# Patient Record
Sex: Female | Born: 1965
Health system: Southern US, Community
[De-identification: ages and names within clinical notes are randomized; demographics above are authoritative.]

## PROBLEM LIST (undated history)

## (undated) DIAGNOSIS — Z8669 Personal history of other diseases of the nervous system and sense organs: Secondary | ICD-10-CM

## (undated) DIAGNOSIS — E119 Type 2 diabetes mellitus without complications: Secondary | ICD-10-CM

## (undated) DIAGNOSIS — K760 Fatty (change of) liver, not elsewhere classified: Secondary | ICD-10-CM

## (undated) DIAGNOSIS — S060XAA Concussion with loss of consciousness status unknown, initial encounter: Secondary | ICD-10-CM

## (undated) DIAGNOSIS — G4730 Sleep apnea, unspecified: Secondary | ICD-10-CM

## (undated) DIAGNOSIS — S060X9A Concussion with loss of consciousness of unspecified duration, initial encounter: Secondary | ICD-10-CM

## (undated) DIAGNOSIS — R609 Edema, unspecified: Secondary | ICD-10-CM

## (undated) DIAGNOSIS — F32A Depression, unspecified: Secondary | ICD-10-CM

## (undated) DIAGNOSIS — M1990 Unspecified osteoarthritis, unspecified site: Secondary | ICD-10-CM

## (undated) DIAGNOSIS — R21 Rash and other nonspecific skin eruption: Secondary | ICD-10-CM

## (undated) DIAGNOSIS — D179 Benign lipomatous neoplasm, unspecified: Secondary | ICD-10-CM

## (undated) DIAGNOSIS — Z6841 Body Mass Index (BMI) 40.0 and over, adult: Secondary | ICD-10-CM

## (undated) DIAGNOSIS — F419 Anxiety disorder, unspecified: Secondary | ICD-10-CM

## (undated) DIAGNOSIS — I77810 Thoracic aortic ectasia: Secondary | ICD-10-CM

## (undated) DIAGNOSIS — E7800 Pure hypercholesterolemia, unspecified: Secondary | ICD-10-CM

## (undated) DIAGNOSIS — J45909 Unspecified asthma, uncomplicated: Secondary | ICD-10-CM

## (undated) DIAGNOSIS — G4733 Obstructive sleep apnea (adult) (pediatric): Secondary | ICD-10-CM

## (undated) DIAGNOSIS — I10 Essential (primary) hypertension: Secondary | ICD-10-CM

## (undated) HISTORY — DX: Body Mass Index (BMI) 40.0 and over, adult: Z684

## (undated) HISTORY — DX: Anxiety disorder, unspecified: F41.9

## (undated) HISTORY — DX: Sleep apnea, unspecified: G47.30

## (undated) HISTORY — DX: Pure hypercholesterolemia, unspecified: E78.00

## (undated) HISTORY — PX: UPPER GASTROINTESTINAL ENDOSCOPY: SHX188

## (undated) HISTORY — DX: Unspecified osteoarthritis, unspecified site: M19.90

## (undated) HISTORY — DX: Fatty (change of) liver, not elsewhere classified: K76.0

## (undated) HISTORY — DX: Edema, unspecified: R60.9

## (undated) HISTORY — PX: SPINAL CORD STIMULATOR IMPLANT: SHX2422

## (undated) HISTORY — DX: Depression, unspecified: F32.A

## (undated) HISTORY — DX: Obstructive sleep apnea (adult) (pediatric): G47.33

## (undated) HISTORY — PX: BACK SURGERY: SHX140

## (undated) HISTORY — DX: Personal history of other diseases of the nervous system and sense organs: Z86.69

## (undated) HISTORY — DX: Thoracic aortic ectasia: I77.810

## (undated) HISTORY — PX: ABDOMINAL HYSTERECTOMY: SHX81

## (undated) HISTORY — PX: CARPAL TUNNEL RELEASE: SHX101

## (undated) HISTORY — PX: SHOULDER SURGERY: SHX246

## (undated) HISTORY — DX: Morbid (severe) obesity due to excess calories: E66.01

---

## 1985-06-15 HISTORY — PX: KNEE SURGERY: SHX244

## 1998-02-09 ENCOUNTER — Emergency Department (HOSPITAL_COMMUNITY): Admission: EM | Admit: 1998-02-09 | Discharge: 1998-02-10 | Payer: Self-pay | Admitting: Emergency Medicine

## 1998-09-29 ENCOUNTER — Emergency Department (HOSPITAL_COMMUNITY): Admission: EM | Admit: 1998-09-29 | Discharge: 1998-09-29 | Payer: Self-pay | Admitting: Emergency Medicine

## 1998-11-05 ENCOUNTER — Encounter: Payer: Self-pay | Admitting: Internal Medicine

## 1998-11-05 ENCOUNTER — Ambulatory Visit (HOSPITAL_COMMUNITY): Admission: RE | Admit: 1998-11-05 | Discharge: 1998-11-05 | Payer: Self-pay | Admitting: Internal Medicine

## 1999-07-25 ENCOUNTER — Emergency Department (HOSPITAL_COMMUNITY): Admission: EM | Admit: 1999-07-25 | Discharge: 1999-07-25 | Payer: Self-pay | Admitting: Emergency Medicine

## 1999-11-05 ENCOUNTER — Emergency Department (HOSPITAL_COMMUNITY): Admission: EM | Admit: 1999-11-05 | Discharge: 1999-11-05 | Payer: Self-pay | Admitting: Emergency Medicine

## 2000-07-28 ENCOUNTER — Encounter: Payer: Self-pay | Admitting: Family Medicine

## 2000-07-28 ENCOUNTER — Ambulatory Visit (HOSPITAL_COMMUNITY): Admission: RE | Admit: 2000-07-28 | Discharge: 2000-07-28 | Payer: Self-pay | Admitting: Family Medicine

## 2000-11-23 ENCOUNTER — Encounter: Payer: Self-pay | Admitting: Family Medicine

## 2000-11-23 ENCOUNTER — Encounter: Admission: RE | Admit: 2000-11-23 | Discharge: 2000-11-23 | Payer: Self-pay | Admitting: Family Medicine

## 2003-01-11 ENCOUNTER — Other Ambulatory Visit: Admission: RE | Admit: 2003-01-11 | Discharge: 2003-01-11 | Payer: Self-pay | Admitting: Obstetrics and Gynecology

## 2003-01-11 ENCOUNTER — Other Ambulatory Visit: Admission: RE | Admit: 2003-01-11 | Discharge: 2003-01-11 | Payer: Self-pay | Admitting: Obstetrics & Gynecology

## 2003-01-31 ENCOUNTER — Ambulatory Visit (HOSPITAL_COMMUNITY): Admission: RE | Admit: 2003-01-31 | Discharge: 2003-01-31 | Payer: Self-pay | Admitting: Obstetrics & Gynecology

## 2003-02-01 ENCOUNTER — Encounter (INDEPENDENT_AMBULATORY_CARE_PROVIDER_SITE_OTHER): Payer: Self-pay | Admitting: Specialist

## 2003-02-01 ENCOUNTER — Ambulatory Visit (HOSPITAL_COMMUNITY): Admission: RE | Admit: 2003-02-01 | Discharge: 2003-02-01 | Payer: Self-pay | Admitting: Obstetrics & Gynecology

## 2003-04-30 ENCOUNTER — Emergency Department (HOSPITAL_COMMUNITY): Admission: EM | Admit: 2003-04-30 | Discharge: 2003-04-30 | Payer: Self-pay | Admitting: Emergency Medicine

## 2003-06-19 ENCOUNTER — Emergency Department (HOSPITAL_COMMUNITY): Admission: EM | Admit: 2003-06-19 | Discharge: 2003-06-19 | Payer: Self-pay | Admitting: Emergency Medicine

## 2003-09-18 ENCOUNTER — Emergency Department (HOSPITAL_COMMUNITY): Admission: EM | Admit: 2003-09-18 | Discharge: 2003-09-18 | Payer: Self-pay | Admitting: Emergency Medicine

## 2003-10-10 ENCOUNTER — Emergency Department (HOSPITAL_COMMUNITY): Admission: EM | Admit: 2003-10-10 | Discharge: 2003-10-10 | Payer: Self-pay | Admitting: Emergency Medicine

## 2003-11-21 ENCOUNTER — Other Ambulatory Visit: Admission: RE | Admit: 2003-11-21 | Discharge: 2003-11-21 | Payer: Self-pay | Admitting: Obstetrics & Gynecology

## 2003-12-27 ENCOUNTER — Inpatient Hospital Stay (HOSPITAL_COMMUNITY): Admission: AD | Admit: 2003-12-27 | Discharge: 2003-12-27 | Payer: Self-pay | Admitting: Obstetrics and Gynecology

## 2004-01-17 ENCOUNTER — Inpatient Hospital Stay (HOSPITAL_COMMUNITY): Admission: AD | Admit: 2004-01-17 | Discharge: 2004-01-17 | Payer: Self-pay | Admitting: Obstetrics & Gynecology

## 2004-02-25 ENCOUNTER — Inpatient Hospital Stay (HOSPITAL_COMMUNITY): Admission: AD | Admit: 2004-02-25 | Discharge: 2004-02-25 | Payer: Self-pay | Admitting: Obstetrics and Gynecology

## 2004-03-05 ENCOUNTER — Ambulatory Visit (HOSPITAL_COMMUNITY): Admission: RE | Admit: 2004-03-05 | Discharge: 2004-03-05 | Payer: Self-pay | Admitting: Obstetrics & Gynecology

## 2004-04-08 ENCOUNTER — Encounter: Admission: RE | Admit: 2004-04-08 | Discharge: 2004-04-16 | Payer: Self-pay | Admitting: Obstetrics & Gynecology

## 2004-05-11 ENCOUNTER — Inpatient Hospital Stay (HOSPITAL_COMMUNITY): Admission: AD | Admit: 2004-05-11 | Discharge: 2004-05-11 | Payer: Self-pay | Admitting: Obstetrics and Gynecology

## 2004-05-23 ENCOUNTER — Encounter (INDEPENDENT_AMBULATORY_CARE_PROVIDER_SITE_OTHER): Payer: Self-pay | Admitting: Specialist

## 2004-05-23 ENCOUNTER — Inpatient Hospital Stay (HOSPITAL_COMMUNITY): Admission: AD | Admit: 2004-05-23 | Discharge: 2004-05-26 | Payer: Self-pay | Admitting: Obstetrics and Gynecology

## 2004-05-27 ENCOUNTER — Inpatient Hospital Stay (HOSPITAL_COMMUNITY): Admission: RE | Admit: 2004-05-27 | Discharge: 2004-05-27 | Payer: Self-pay | Admitting: Obstetrics and Gynecology

## 2004-05-29 ENCOUNTER — Ambulatory Visit (HOSPITAL_COMMUNITY): Admission: RE | Admit: 2004-05-29 | Discharge: 2004-05-29 | Payer: Self-pay | Admitting: Specialist

## 2004-06-04 ENCOUNTER — Inpatient Hospital Stay (HOSPITAL_COMMUNITY): Admission: AD | Admit: 2004-06-04 | Discharge: 2004-06-04 | Payer: Self-pay | Admitting: Obstetrics and Gynecology

## 2004-06-15 HISTORY — PX: BACK SURGERY: SHX140

## 2004-06-15 HISTORY — PX: ABDOMINAL HYSTERECTOMY: SHX81

## 2004-06-20 ENCOUNTER — Other Ambulatory Visit: Admission: RE | Admit: 2004-06-20 | Discharge: 2004-06-20 | Payer: Self-pay | Admitting: Obstetrics and Gynecology

## 2004-06-27 ENCOUNTER — Inpatient Hospital Stay (HOSPITAL_COMMUNITY): Admission: AD | Admit: 2004-06-27 | Discharge: 2004-06-27 | Payer: Self-pay | Admitting: Obstetrics & Gynecology

## 2004-10-24 ENCOUNTER — Inpatient Hospital Stay (HOSPITAL_COMMUNITY): Admission: RE | Admit: 2004-10-24 | Discharge: 2004-10-25 | Payer: Self-pay | Admitting: Specialist

## 2004-12-09 ENCOUNTER — Emergency Department (HOSPITAL_COMMUNITY): Admission: EM | Admit: 2004-12-09 | Discharge: 2004-12-09 | Payer: Self-pay | Admitting: Emergency Medicine

## 2005-03-13 ENCOUNTER — Encounter: Admission: RE | Admit: 2005-03-13 | Discharge: 2005-03-13 | Payer: Self-pay | Admitting: Specialist

## 2005-04-23 ENCOUNTER — Inpatient Hospital Stay (HOSPITAL_COMMUNITY): Admission: RE | Admit: 2005-04-23 | Discharge: 2005-04-27 | Payer: Self-pay | Admitting: Specialist

## 2005-06-15 HISTORY — PX: SHOULDER SURGERY: SHX246

## 2005-11-25 ENCOUNTER — Ambulatory Visit (HOSPITAL_COMMUNITY): Admission: RE | Admit: 2005-11-25 | Discharge: 2005-11-25 | Payer: Self-pay | Admitting: Specialist

## 2006-01-18 ENCOUNTER — Emergency Department (HOSPITAL_COMMUNITY): Admission: EM | Admit: 2006-01-18 | Discharge: 2006-01-18 | Payer: Self-pay | Admitting: Emergency Medicine

## 2006-06-05 ENCOUNTER — Encounter: Admission: RE | Admit: 2006-06-05 | Discharge: 2006-06-05 | Payer: Self-pay

## 2006-09-03 ENCOUNTER — Ambulatory Visit (HOSPITAL_COMMUNITY): Admission: RE | Admit: 2006-09-03 | Discharge: 2006-09-03 | Payer: Self-pay | Admitting: Obstetrics and Gynecology

## 2006-09-22 ENCOUNTER — Other Ambulatory Visit: Admission: RE | Admit: 2006-09-22 | Discharge: 2006-09-22 | Payer: Self-pay | Admitting: Obstetrics and Gynecology

## 2006-10-01 ENCOUNTER — Ambulatory Visit (HOSPITAL_COMMUNITY): Admission: RE | Admit: 2006-10-01 | Discharge: 2006-10-01 | Payer: Self-pay | Admitting: Specialist

## 2006-11-10 ENCOUNTER — Encounter: Admission: RE | Admit: 2006-11-10 | Discharge: 2006-11-10 | Payer: Self-pay | Admitting: Obstetrics and Gynecology

## 2007-08-18 ENCOUNTER — Encounter: Admission: RE | Admit: 2007-08-18 | Discharge: 2007-08-18 | Payer: Self-pay | Admitting: Specialist

## 2007-09-15 ENCOUNTER — Ambulatory Visit: Payer: Self-pay | Admitting: *Deleted

## 2007-10-05 ENCOUNTER — Inpatient Hospital Stay (HOSPITAL_COMMUNITY): Admission: RE | Admit: 2007-10-05 | Discharge: 2007-10-09 | Payer: Self-pay | Admitting: Specialist

## 2007-10-07 ENCOUNTER — Ambulatory Visit: Payer: Self-pay | Admitting: Vascular Surgery

## 2007-10-07 ENCOUNTER — Encounter (INDEPENDENT_AMBULATORY_CARE_PROVIDER_SITE_OTHER): Payer: Self-pay | Admitting: Specialist

## 2007-11-16 ENCOUNTER — Encounter: Admission: RE | Admit: 2007-11-16 | Discharge: 2007-11-16 | Payer: Self-pay | Admitting: Family Medicine

## 2007-12-23 ENCOUNTER — Ambulatory Visit: Payer: Self-pay | Admitting: *Deleted

## 2008-02-10 ENCOUNTER — Encounter: Admission: RE | Admit: 2008-02-10 | Discharge: 2008-02-10 | Payer: Self-pay | Admitting: Specialist

## 2008-03-22 ENCOUNTER — Other Ambulatory Visit: Admission: RE | Admit: 2008-03-22 | Discharge: 2008-03-22 | Payer: Self-pay | Admitting: Obstetrics and Gynecology

## 2008-06-12 ENCOUNTER — Inpatient Hospital Stay (HOSPITAL_COMMUNITY): Admission: RE | Admit: 2008-06-12 | Discharge: 2008-06-14 | Payer: Self-pay | Admitting: Obstetrics and Gynecology

## 2008-06-12 ENCOUNTER — Encounter (INDEPENDENT_AMBULATORY_CARE_PROVIDER_SITE_OTHER): Payer: Self-pay | Admitting: Obstetrics and Gynecology

## 2008-10-13 ENCOUNTER — Ambulatory Visit (HOSPITAL_COMMUNITY): Admission: RE | Admit: 2008-10-13 | Discharge: 2008-10-13 | Payer: Self-pay | Admitting: Family Medicine

## 2008-10-13 ENCOUNTER — Ambulatory Visit: Payer: Self-pay | Admitting: Vascular Surgery

## 2008-10-13 ENCOUNTER — Encounter (INDEPENDENT_AMBULATORY_CARE_PROVIDER_SITE_OTHER): Payer: Self-pay | Admitting: Family Medicine

## 2008-10-17 ENCOUNTER — Emergency Department (HOSPITAL_COMMUNITY): Admission: EM | Admit: 2008-10-17 | Discharge: 2008-10-17 | Payer: Self-pay | Admitting: Emergency Medicine

## 2008-11-09 ENCOUNTER — Encounter: Admission: RE | Admit: 2008-11-09 | Discharge: 2008-11-09 | Payer: Self-pay | Admitting: Specialist

## 2008-12-10 ENCOUNTER — Encounter: Admission: RE | Admit: 2008-12-10 | Discharge: 2008-12-10 | Payer: Self-pay | Admitting: Family Medicine

## 2008-12-13 ENCOUNTER — Encounter: Admission: RE | Admit: 2008-12-13 | Discharge: 2008-12-13 | Payer: Self-pay | Admitting: Family Medicine

## 2009-01-09 ENCOUNTER — Emergency Department (HOSPITAL_COMMUNITY): Admission: EM | Admit: 2009-01-09 | Discharge: 2009-01-09 | Payer: Self-pay | Admitting: Emergency Medicine

## 2009-01-11 ENCOUNTER — Ambulatory Visit (HOSPITAL_COMMUNITY): Admission: RE | Admit: 2009-01-11 | Discharge: 2009-01-11 | Payer: Self-pay | Admitting: Emergency Medicine

## 2009-01-30 ENCOUNTER — Encounter: Admission: RE | Admit: 2009-01-30 | Discharge: 2009-01-30 | Payer: Self-pay | Admitting: Orthopedic Surgery

## 2009-09-20 ENCOUNTER — Emergency Department (HOSPITAL_COMMUNITY): Admission: EM | Admit: 2009-09-20 | Discharge: 2009-09-20 | Payer: Self-pay | Admitting: Emergency Medicine

## 2009-12-31 ENCOUNTER — Ambulatory Visit: Payer: Self-pay | Admitting: Vascular Surgery

## 2010-06-15 HISTORY — PX: SPINAL CORD STIMULATOR IMPLANT: SHX2422

## 2010-06-27 ENCOUNTER — Ambulatory Visit
Admission: RE | Admit: 2010-06-27 | Payer: Self-pay | Source: Home / Self Care | Attending: Cardiology | Admitting: Cardiology

## 2010-06-30 LAB — GLUCOSE, POCT (MANUAL RESULT ENTRY)
Glucose, Bld: 132 mg/dL — ABNORMAL HIGH (ref 70–99)
Operator id: 135711

## 2010-07-06 ENCOUNTER — Encounter: Payer: Self-pay | Admitting: Obstetrics and Gynecology

## 2010-07-07 ENCOUNTER — Encounter: Payer: Self-pay | Admitting: Family Medicine

## 2010-07-23 ENCOUNTER — Other Ambulatory Visit: Payer: Self-pay | Admitting: Obstetrics and Gynecology

## 2010-07-23 DIAGNOSIS — N644 Mastodynia: Secondary | ICD-10-CM

## 2010-07-25 ENCOUNTER — Ambulatory Visit: Payer: Self-pay

## 2010-09-21 LAB — URINALYSIS, ROUTINE W REFLEX MICROSCOPIC
Bilirubin Urine: NEGATIVE
Hgb urine dipstick: NEGATIVE
Ketones, ur: NEGATIVE mg/dL
Specific Gravity, Urine: 1.019 (ref 1.005–1.030)
Urobilinogen, UA: 1 mg/dL (ref 0.0–1.0)

## 2010-09-21 LAB — LIPASE, BLOOD: Lipase: 21 U/L (ref 11–59)

## 2010-09-21 LAB — DIFFERENTIAL
Basophils Absolute: 0 10*3/uL (ref 0.0–0.1)
Basophils Relative: 1 % (ref 0–1)
Eosinophils Absolute: 0.1 10*3/uL (ref 0.0–0.7)
Eosinophils Relative: 2 % (ref 0–5)
Lymphs Abs: 2.6 10*3/uL (ref 0.7–4.0)

## 2010-09-21 LAB — COMPREHENSIVE METABOLIC PANEL
ALT: 18 U/L (ref 0–35)
AST: 21 U/L (ref 0–37)
Alkaline Phosphatase: 54 U/L (ref 39–117)
CO2: 32 mEq/L (ref 19–32)
Chloride: 104 mEq/L (ref 96–112)
GFR calc Af Amer: >60 mL/min (ref >60)
GFR calc non Af Amer: >60 mL/min (ref >60)
Glucose, Bld: 91 mg/dL (ref 70–99)
Potassium: 4.1 mEq/L (ref 3.5–5.1)
Sodium: 141 mEq/L (ref 135–145)
Total Bilirubin: 0.4 mg/dL (ref 0.3–1.2)

## 2010-09-21 LAB — CBC: WBC: 6.9 10*3/uL (ref 4.0–10.5)

## 2010-10-28 NOTE — Op Note (Signed)
[name], [name]     ACCOUNT NO.:  [id number]   MEDICAL RECORD NO.:  [id number]          PATIENT TYPE:  INP   LOCATION:  3114                         FACILITY:  [facility]   PHYSICIAN:  Grace Beal, MD    DATE OF BIRTH:  March 08, 1966   DATE OF PROCEDURE:  10/05/2007  DATE OF DISCHARGE:                               OPERATIVE REPORT   PREOPERATIVE DIAGNOSIS:  Failed previous posterior TLIF with  pseudoarthrosis, L4-L5.   POSTOPERATIVE DIAGNOSES:  1. Exploration of fusion.  2. Failed attempt and anterior removal of TLIF cage and interbody      fusion, L4-L5.   OPERATIVE PROCEDURE:  1. Exploration of interbody fusion at L4-L5  2. Revision, anterior lumbar interbody fusion at L4-L5.   APPROACH SURGEON:  [name], M.D.   HISTORY:  This is a 45 year old woman who is under the care of my  partner Dr. Shelle Davis.  She had a previous posterior minimally invasive  transforaminal lumbar interbody fusion with Flexi-Rods placed at L4-L5.  The patient went on to develop significant lytic changes within the  vertebral bodies of L4-L5, subsides of the TLIF cage and an obvious  nonunion.  Because of ongoing severe pain, the patient wished to have  revision surgery done.  I was asked by Dr. Shelle Davis to perform an anterior  interbody fusion and excision of the TLIF graft.   OPERATIVE NOTE:  At the time of my scrubbing into the case, Dr. Shelle Davis  had already removed the posterior instrumentation and placed new pedicle  screws at the bodies of L4-L5.   At this point in time with the posterior procedure done, the patient was  turned into the supine position.  All bony prominences were well padded  and I assisted Dr. Madilyn Davis in performing an anterior approach to the  lumbar spine.  At this point in time, we noted that there was  significant adhesions and it was very difficult mobilizing the left  iliac vein.  There some minor to moderate bleeding from the vein.  Attempts were then made, since  we could not mobilize the vein to the  right was to come in the axilla of the bifurcation.  We were able to  expose a small portion of the disk space, but I was unable to expose  enough that I could safely without risking injury to the vascular  structures, safely perform a diskectomy, excise the TLIF graft, and  place a new interbody device.  At this point, because of the limited  exposure due to the significant inflammation and adhesion of the  vascular structures, I elected with Dr. Madilyn Davis and advised to abort the  attempted fusion.  I did note that upon palpation of that disk, there  was no solid arthrodesis and there was still some viable soft disk  material present.  At this point, we then closed the anterior wound by  reapproximating the fascia of the rectus abdominis with running #1  Vicryl suture and then closing a sequential layer with 2-0 Vicryl  sutures deep and 3-0 Monocryl for the skin.  Steri-Strips and dry  dressing were  applied and Dr. Shelle Davis then  took back over the case in order to turn the  patient into the prone position to apply the rods and lock her into  position.  Please refer to Dr. Madilyn Davis' and Dr. Ermelinda Davis dictations for  remainder of the operative note.      Grace Beal, MD  Electronically Signed     DDB/MEDQ  D:  10/05/2007  T:  10/06/2007  Job:  [id number]   cc:   [name], M.D.

## 2010-10-28 NOTE — Discharge Summary (Signed)
[name], [name]     ACCOUNT NO.:  [id number]   MEDICAL RECORD NO.:  [id number]          PATIENT TYPE:  INP   LOCATION:  5029                         FACILITY:  [facility]   PHYSICIAN:  Jene Every, M.D.    DATE OF BIRTH:  09/25/1965   DATE OF ADMISSION:  10/05/2007  DATE OF DISCHARGE:  10/09/2007                               DISCHARGE SUMMARY   ADMISSION DIAGNOSES:  1. Pseudoarthrosis L4-L5.  2. Hypertension.   DISCHARGE DIAGNOSES:  1. Pseudoarthrosis L4-L5.  2. Hypertension.  3. Status post revision of posterior lumbar fusion.  4. Failed anterior approach for anterior fusion.   HISTORY:  [name] is a pleasant 45 year old female who is a well-  known patient to our practice.  She has undergone multiple back  surgeries in the past and I believe had lumbar fusion done at another  outside facility.  She returned to our office secondary to persistent  disabling pain.  It was found that she had pseudoarthrosis as well as  formation of a large cyst within the vertebral bodies of L4-L5.  There  was a preliminary reaction to the BMP using her fusion.  The patient  describes her pain as disabling, predominantly back pain.  It is felt at  this point she would benefit from removal of hardware, re-  instrumentation, and fusion,, and also felt a STALIF device was  indicated as well.  The risks and benefits of the surgery were discussed  with the patient.  She does elect to proceed.   PROCEDURE NOTE:  1. Exploration of posterior lumbar fusion with removal of pedicle      screws and rod for Medtronic system, harvesting of left iliac crest      bone graft, posterior lateral fusion using autogenous bone,      placement of pedicle screw fixation utilizing Stryker system.   SURGEON:  Jene Every, MD   ASSISTANTS:  1. Roma Schanz, PA-C  2. Dahari D. Shon Baton, MD   1. Attempted anterior lumbar interbody fusion with exploration.  This      attempt failed secondary to  scar tissue approach.   SURGICAL ASSISTANTS:  1. John C. Madilyn Fireman, MD  2. Balinda Quails, MD   PRIMARY SURGEON OF ORTHOPEDICS:  Dahari D. Shon Baton, MD.   COMPLICATIONS:  Again, the surgery was abandoned secondary to poor  visualization of the vertebra.  The patient was closed and then brought  back to the posterior position.   CONSULTS:  PT/OT care management.   PREOPERATIVE LABS:  Showed a white cell count of 7.5, hemoglobin of  13.2, and hematocrit of 38.4.  These were followed throughout the  hospital course.  The patient's white cell count did fall on postop day  #1 to 14.1, however, at the time of discharge normalized to 8.6;  hemoglobin as low as 8.3, and hematocrit 24.9, however, at the time of  discharge stabilized to 9.8 and hematocrit 28.8.  Coagulation studies  done preoperatively within normal range.  Preoperative chemistry showed  a sodium of 138, potassium 3.6, slightly elevated glucose of 103 with  normal BUN and creatinine.  These were followed throughout the hospital  course.  Sodium remained normal.  Potassium decreased to 3.3, however,  at the time of discharge normalized to 3.6.  glucose fluctuated with a  highest of 188 and at the time of discharge was 153.  Renal function  remained stable.  Preop liver function tests within normal range.  Preop  urinalysis showed a 15 mg/dL of protein, otherwise normal.  Blood type  is AB negative.  Preoperative EKG showed normal sinus rhythm.  No  significant changes noted from prior EKG from 2007.  Did not see a  preoperative chest x-ray in the chart.   HOSPITAL COURSE:  The patient was taken to the OR and underwent the  above-stated procedures.  Again, anterior approach was abandoned  secondary to poor visualization.  Therefore, the patient was closed  anteriorly, flipped back over, and the revision of posterior fusion was  completed.  She was then transferred to the PACU and then to the  orthopedic floor for continued  postoperative care.  Postoperatively, the  patient did fairly well.  She noted decrease in pain in the back.  Vital  signs were stable.  She was afebrile.  We encouraged out of bed with  brace.  Therapy was consulted and the patient did fairly well with this.  She again noticed significant decrease in her low back symptoms.  Postop  day #2, dressing was changed.  Incision was clean, dry and intact.  We  did discontinue the Foley as well as the O2 at this time.  The patient  was weaned off her PCA.  We did obtain a Doppler, which was negative.  Hemoglobin was 8.7 and hematocrit 26.0.  This was checked closely.  The  patient was slightly tachycardic at that time.  Hemoglobin was checked  following day and it remained fairly consistent at 8.3.  She has not had  any fever or chills.  She was noting some mild headache as well as mild  nausea.  She was passing flatus without difficulty.  Incision remained  clean and dry.  At that time, she was transfused 2 units of packed red  blood cells.  She was pre-medicated.  She did well with the transfusion.  There were no complications.  On postop day #4, the patient was feeling  much better.  Following the blood transfusion, she had been out of bed  and ambulating without significant difficulty.  She had a low-grade  fever at 99.8.  Hemoglobin improved to 9.8 and hematocrit 28.0.  Incision remained clean and dry.  It was felt at this point that the  patient was stable to be discharged home.   DISPOSITION:  The patient was discharged home with home health.  Therapy  needs met.  She is to follow up with Dr. Shelle Iron in approximately 10 or 14  days for suture removal as well as x-rays.  Wound care is to keep both  incisions clean and dry, and call if any excess drainage or erythema  develops.   ACTIVITY RESTRICTIONS:  She is to walk as tolerated.  No bending,  stooping, twisting, or lifting.  No driving.   DIET:  As tolerated, would recommend high  fiber.   DISCHARGE MEDICATIONS:  To resume all home medications as well as  1. Vicodin 1-2 p.o. q.4-6 h. p.r.n. for pain.  2. Robaxin 1 p.o. q.6 h. p.r.n. spasm.  3. Vitamin C 500 mg daily.   CONDITION ON DISCHARGE:  Stable.   FINAL DIAGNOSES:  Doing well, status post revision  of posterior lumbar  fusion, failed attempt at anterior lumbar fusion.      Roma Schanz, P.A.      Jene Every, M.D.  Electronically Signed    CS/MEDQ  D:  11/16/2007  T:  11/17/2007  Job:  [id number]

## 2010-10-28 NOTE — Op Note (Signed)
NAMEHISAKO, BUGH     ACCOUNT NO.:  [id number]   MEDICAL RECORD NO.:  [id number]          PATIENT TYPE:  INP   LOCATION:  3114                         FACILITY:  [facility]   PHYSICIAN:  Jene Every, M.D.    DATE OF BIRTH:  1966/03/03   DATE OF PROCEDURE:  DATE OF DISCHARGE:                               OPERATIVE REPORT   PREOPERATIVE DIAGNOSES:  1. Pseudarthrosis.  2. 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   POSTOPERATIVE DIAGNOSES:  1. Pseudarthrosis.  2. 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   PROCEDURE PERFORMED:  1. Exploration of posterior lumbar fusion with removal of pedicle      screws and rod from the Medtronic system.  2. Harvesting of left iliac crest bone graft.  3. Posterolateral fusion, utilizing autologous and iliac crest bone      graft.  4. Pedicle screw instrumentation utilizing the Stryker 90-D system, 35-      mm screws at the pedicles of 4, 40-mm screws at the pedicles of 5,      and a 40-mm rod bilaterally.  5. The iliac crest bone graft was obtained through a separate fascial      incision.   SURGEON:  Jene Every, MD.   ASSISTANTS:  1. Roma Schanz, PA, for the exploration of the fusion, removal      of hardware, and placement of pedicle screws.  2. Alvy Beal, MD, for the iliac crest bone graft, and the      posterolateral fusion, and placement of connector rods.   ANESTHESIA:  General.   BRIEF HISTORY:  The patient is a 41-hour-old female who is status post  posterior lumbar interbody fusion with a TLIF utilizing BMP sponge.  She  was found to have a subsidence of her fusion and pseudoarthrosis  formation of large cyst within the vertebral bodies of 4 and 5.  This  presumably has a reaction to the BMP sponge.  She has had debilitating  pain.  She was indicated predominantly for back pain.  She was indicated  for removal of hardware, reinstrumentation, and fusion.  We also felt  an  anterior approach to remove the Peek cage followed by [facility] device to  revise the pseudarthrosis would be appropriate.  We discussed risks and  benefits including bleeding, infection, damage to vascular structures,  nonunion, need for hardware removal, DVT, PE, inability to perform the  procedure, worsening of symptoms, etc.   The patient was placed in a supine position.  After induction of  adequate general anesthesia and 2 g Kefzol, she was placed prone on the  [place] rotating table.  This was in the prone position.  Iliac crest,  chest, and all areas were well padded.  A Foley was placed.  Lumbar  region was prepped and draped in the usual sterile fashion.  We made an  incision in the midline.  Incision extended in cephalad and caudad to  the vertebral spinous process 3 to S1.  Subcutaneous tissue was  dissected.  Electrocautery was allowed to achieve hemostasis.  The  fascia was identified by a long skin incision.  Paraspinous muscle was  elevated from the lamina of 4 and 5.  Extensive scar tissue was noted  bilaterally, particularly on the left.  We are able to identify and  skeletonize the rod and the screws.  McCullough retractor was then  placed.  We also identified the lamina on the right, curetted and  exposed lamina of 4 and 5, preserving the facet of 3 and 4, also  exposing the spinous process of 4 and 5.  After removal of exuberant  scar tissue and identifying the pedicle screws, the pedicle screws were  removed.  The set screws were removed first followed by the removal of  the Peek rods.  The screws were then removed.  The screw holes were  probed, found to be within bone.  I did not probe the left L4  anteriorly.  It appeared that it had broached the cortical surface of  the vertebral body.  I was able to go around the spinous process of 4  and 5, there did not appear to be a solid fusion.  No significant bone  graft mass was noted in the lateral recess.  We then  selected a Stryker  90-D system that we were familiar with.  We used a 675 pedicle screw  diameter to replace the 65, we used 35-mm screws at 4, and 40-mm screws  at 5.  They were advanced.  Excellent purchase was obtained.  Following  this, we turned attention towards the iliac crest on the left.  In the  separate fascial incision,  we incised the fascia over the iliac crest.  We removed the portion of the outer table and curetted using a curette  and a small gouge to remove copious portion of the cancellus material  between the two tables taking care not to enter the sciatic notch or  violate the SI joint.  After we removed the iliac crest bone graft, we  copiously irrigated that area, placed thrombin-soaked Gelfoam there and  repaired the fascia with #1 Vicryl interrupted figure-of-eight sutures.  The bone graft was saved for future bone grafting, anticipating  anteriorly and posteriorly.  We then copiously irrigated the wound.  We  loosely closed the fascia with #1 Vicryl and the skin with Prolene.  Sterile dressing was applied.  She was then basically rotated into the  supine position from the prone position and repair for the anterior  approach.  This was performed by Dr. Shon Baton and Dr. Madilyn Fireman.  Noted during  this portion of the procedure was a significant amount of anterior  fibrotic tissue that precluded the adequate mobilization of these  vessels to expose the 4 or 5 disk for the anterior approach, and  therefore, that was abandoned.  The anterior wound was then closed and a  sterile dressing applied.  She was then rotated back into the prone  position. Again all bony prominences well-padded.  The wound was prepped  and draped in usual sterile fashion.  Once again, we removed the Prolene  sutures on the skin and the fascial incision sutures.  Next, McCullough  retractor was then placed.  We then selected 40-mm rods and placed them  into the screw heads from the set screw torquing  device and clamped the  rod bilaterally with excellent fixation of the instrumentation.  On the  right, we identified the TPs and high-speed bur was utilized to  decorticate the TPs on the right as well as the residual lamina of 4 or  5 and the spinous process  on the right of 4 or 5.  Cancellous bone graft  from the iliac crest and Actifuse was mixed and then packed posterior  and posterolateral and into the lateral trough.  We also decorticated  the pars.  This was without difficulty.  Next, we retained C-arm  augmentation in the AP and lateral plane, which we did previously  confirming the placement of the screws and the rod to be satisfactory.  Next, the wound was copiously irrigated with irrigation.  Additional 2  gm Kefzol was given during the case.  We removed the [facility]  retractors.  Paraspinous muscle inspected and no evidence of active  bleeding.  Dorsolumbar fascia were reapproximated with #1 Vicryl and  figure-of-eight sutures.  I also closed the dead space and the subcu fat  towards the iliac crest with 0 Vicryl simple sutures.  Subcutaneous  tissues were reapproximated with 0 and 2-0 Vicryl simple sutures.  Skin  was reapproximated with staples and were dressed sterilely.  She was  placed supine on hospital bed, extubated without difficulty, and  transported to the recovery room in satisfactory condition.   The patient tolerated the procedure well.  No complications.  Approximate blood loss was 700 mL.  She received 500 mL back in [facility]  [place].   The patient's operative course was made more difficult by her obesity in  the previous two surgical procedures.     Jene Every, M.D.  Electronically Signed    JB/MEDQ  D:  10/05/2007  T:  10/06/2007  Job:  [id number]

## 2010-10-28 NOTE — H&P (Signed)
[name], [name]     ACCOUNT NO.:  [id number]   MEDICAL RECORD NO.:  [id number]          PATIENT TYPE:  AMB   LOCATION:  SDC                           FACILITY:  [facility]   PHYSICIAN:  Gerald Leitz, MD          DATE OF BIRTH:  08-01-1965   DATE OF ADMISSION:  DATE OF DISCHARGE:                              HISTORY & PHYSICAL   HISTORY OF PRESENT ILLNESS:  This is a 45 year old G5, P2-0-3-2 with  menorrhagia who has failed oral contraceptives.  She has had episodes  where she saturated her clothing.  She desires definitive therapy via  total abdominal hysterectomy.   PAST OBSTETRICAL HISTORY:  Cesarean section x2, elective abortion x1,  miscarriage x2.   PAST GYNECOLOGIC HISTORY:  Menarche at the age of 45.  Contraception:  Tubal ligation.  Last Pap smear March 22, 2008, and this was normal.  The patient had an endometrial biopsy on April 02, 2008:  No evidence  of hyperplasia or malignancy was seen.   PAST MEDICAL HISTORY:  Hypertension.   PAST SURGICAL HISTORY:  Cesarean section x2.  Back surgery in May 2006  and in November 2006, surgery of right shoulder in July 2007.  She also  had fusion of her back in April 2009.   MEDICATIONS:  1. Toprol 100 mg.  2. Norvasc 5 mg.  3. Lasix 20 mg every other day.  4. Neurontin 300 mg.  5. Hydrocodone 1-2 daily.  6. Mobic.  7. Multivitamin.  8. Provera.   ALLERGIES:  No known drug allergies.   SOCIAL HISTORY:  The patient is married.  She denies tobacco use.  She  denies alcohol or illicit drug use.   FAMILY HISTORY:  Negative for breast cancer.  Positive for ovarian  cancer in her great-grandmother.  Negative for colon cancer.   REVIEW OF SYSTEMS:  Negative except as stated in history of current  illness.   PHYSICAL EXAMINATION:  VITAL SIGNS:  Blood pressure 160/74, weight 251-  1/2 pounds.  GENERAL:  Alert and oriented, in no acute distress.  CARDIOVASCULAR:  Regular rate and rhythm.  LUNGS:  Clear to auscultation  bilaterally.  ABDOMEN:  Soft, nontender, nondistended.  No masses.  PELVIC EXAM:  Normal external genitalia, no vulvar or vaginal/cervical  lesions noted.  Bimanual exam reveals no adnexal masses or tenderness.  EXTREMITIES:  No clubbing, cyanosis or edema.   Ultrasound September 03, 2006, showed the uterus to be of normal appearance  measuring 12 cm x 6 cm x 7.5 cm.   IMPRESSION AND PLAN:  The patient is a 45 year old with menorrhagia with menorrhagia who  desires definitive treatment via total abdominal hysterectomy.  Risks,  benefits, alternatives to surgery were discussed with the patient  including but not limited to infection, bleeding, damage to bowel,  bladder or surrounding organs with the need for further surgery; risk of  transfusion human immunodeficiency virus, hepatitis B and C were  discussed.  The patient voiced understanding of all risks and desires to  proceed with total abdominal hysterectomy.  She desires to preserve her  ovaries as long as they appear normal.  Gerald Leitz, MD  Electronically Signed     TC/MEDQ  D:  06/11/2008  T:  06/11/2008  Job:  [id number]

## 2010-10-28 NOTE — Op Note (Signed)
NAMECAMELIA, STELZNER     ACCOUNT NO.:  [id number]   MEDICAL RECORD NO.:  [id number]          PATIENT TYPE:  INP   LOCATION:  9305                          FACILITY:  [facility]   PHYSICIAN:  Gerald Leitz, MD          DATE OF BIRTH:  25-Dec-1965   DATE OF PROCEDURE:  06/12/2008  DATE OF DISCHARGE:                               OPERATIVE REPORT   PREOPERATIVE DIAGNOSIS:  Menorrhagia.   POSTOPERATIVE DIAGNOSES:  1. Menorrhagia.  2. Pelvic adhesive disease.   PROCEDURE:  Total abdominal hysterectomy, extensive lysis of adhesions.   SURGEON:  Gerald Leitz, MD   ASSISTANT:  Bing Neighbors. Delcambre, MD   ANESTHESIA:  General.   FINDINGS:  Adhesions of the uterus, of the anterior abdominal wall, and  omentum, as well as adhesions of the bladder to the anterior abdominal  wall.   SPECIMEN:  Uterus and cervix.   DISPOSITION:  Specimen to pathology.   ESTIMATED BLOOD LOSS:  450 mL.   URINE OUTPUT:  250 mL.   FLUIDS:  1500 mL.   COMPLICATIONS:  None.   PROCEDURE:  The patient was taken to the operating room where she was  placed under general anesthesia.  She was prepped and draped in the  usual sterile fashion.  A Pfannenstiel skin incision was made with a  scalpel, carried down to the underlying layer of fascia and the fascia  was incised in the midline.  This was extended laterally with Mayo  scissors.  Superior aspect of the fascial incision was grasped with  Kocher clamps, elevated, and the underlying rectus muscles were  dissected off with the scalpel as well as Mayo scissors.  This was  repeated on the inferior aspect of the fascial incision.  The rectus  muscles were separated in the midline.  The peritoneum was entered  sharply.  The patient was noted to have adhesions of the omentum to the  anterior abdominal wall.  These were released with [facility] clamps and  transected.  She was suture ligated with 0 Vicryl.  The patient was  noted to have adhesions of the uterus to the  anterior abdominal wall as  well.  Extensive lysis of adhesions took approximately 1 hour of the  case and the uterus was dissected off the anterior abdominal wall using  Metzenbaum scissors as well as blunt dissection.  The omentum attached  to the uterus was also dissected off using Metzenbaum scissors.  Once  normal anatomy was restored, the round ligaments were identified.  The  cornua of the uterus were grasped with Kelly clamps bilaterally.  The  round ligaments were suture ligated with 0 Vicryl and transected with  Mayo scissors bilaterally and the bladder was dissected off the lower  uterine segment with Metzenbaum scissors and a sponge stick.  Attention  was turned to the utero-ovarian ligaments, which were then clamped with  Heaney clamps, transected, and the utero-ovarian pedicle was ligated  with free tie of 0 Vicryl followed by suture ligature of 0 Vicryl.  This  was repeated bilaterally.  The uterine arteries were then skeletonized  bilaterally.  The right uterine artery was  clamped with a Zeppelin  clamp, transected, and suture ligated with 0 Vicryl.  This was repeated  on the left uterine artery.  The cardinal ligament was then clamped with  straight Zeppelin, transected, and suture ligated with 0 Vicryl.  This  was repeated on the left cardinal ligament.  The uterosacrals were  identified, clamped with Heaney clamps, transected, suture ligated with  0 Vicryl bilaterally.  The cervix was amputated from the vagina with  Jorgenson scissors.  Angle suture of 0 Vicryl was placed along the angle  of the vaginal cuff bilaterally and anchored to the ipsilateral  uterosacral ligament.  This was performed bilaterally.  The cuff was  then closed with 0 Vicryl in a running locked fashion.  Extensive  dissection of the bladder off of the anterior abdominal wall.  Methylene  blue was infused into the bladder.  No defects were noted.  The patient  was noted to have some bleeding from the  rest of the bladder.  Hemostasis was obtained with figure-of-eight suture of 2-0 Vicryl.  The  patient was turned to the right broad ligament.  There was some bleeding  along this ligament.  Hemostasis was obtained with a figure-of-eight  suture of 2-0 Vicryl.  The abdomen was copiously irrigated.  The patient  was noted to have some bleeding along the vaginal cuff.  The hemostasis  was obtained with electrocautery.  There was slight oozing from the  bladder flap.  Hemostasis was assured.  Avitene was then placed along  the vaginal cuff and bladder dissection.  All instruments were removed  from the abdomen.  The fascia was closed with #1 PDS in a running  fashion.  Scarpa fascia was reapproximated with 2-0 plain gut in an  interrupted fashion and the skin was closed with subcuticular closure  using 4-0 Vicryl.  Sponge, lap, and needle counts were correct x2.  Ancef 2 g were given at the beginning of the case.  The patient was  awakened from anesthesia and taken to the recovery room awake and in  stable condition.      Gerald Leitz, MD  Electronically Signed     TC/MEDQ  D:  06/12/2008  T:  06/13/2008  Job:  [id number]

## 2010-10-28 NOTE — Consult Note (Signed)
VASCULAR SURGERY CONSULTATION   JACOB CAMPBELL, Christine  DOB:  1965-06-30                                       09/15/2007  WGNFA#:[id number]   PRIMARY CARE PHYSICIAN:  Carolin Coy, MD.   REASON FOR CONSULTATION:  Planned L4-5 anterior lumbar interbody fusion  (ALIF).   HISTORY:  [name] is a 45 year old female with a history of 3  previous back operations for L4-5 disk disease.  She continues to have  increasing back pain and left leg pain radiating down to the medial  aspect of her ankle.  She had a fusion performed approximately 1-1/2  years ago.  She has progressive disk space narrowing and problems with  spurring of the left forearm compressing the L4 root.  A cyst at L4 and  L5 vertebral bodies, no definitive fusion was noted.   PAST MEDICAL HISTORY:  1. Hypertension.  2. Gestational diabetes.   MEDICATIONS:  1. Aspirin 81 mg daily.  2. Gabapentin 300 mg b.i.d. p.r.n.  3. Lopressor 50 mg b.i.d.  4. Hydrocodone/APAP 7.5/325, one to two tablets every 4-6 hours as      needed.  5. Norvasc 5 mg daily.  6. Hydrochlorothiazide 25 mg daily.   ALLERGIES:  None known.   FAMILY HISTORY:  The patient's parents are living.  They are in their  77s, they both have hypertension and diabetes.  Her mother has a history  of breast CA.  No siblings.   SOCIAL HISTORY:  The patient is married with two children.  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  She does not currently use  tobacco, quit smoking in 2004.  No alcohol intake.   REVIEW OF SYSTEMS:  Refer to Patient Encounter Form.  Positive findings  include occasional wheezing and a feeling of depression.   PHYSICAL EXAMINATION:  A generally well-appearing 45 year old [place]  American female.  Alert and oriented.  No distress.  Vital Signs:  BP  144/86, pulse of 77 per minute, respirations are 18 per minute.  HEENT:  Mouth and throat are clear.  Normocephalic.  Extraocular movements  are  intact.  No scleral icterus.  Neck:  Supple, no thyromegaly or  adenopathy.  Cardiovascular:  Normal heart sounds without murmurs.  Regular rate and rhythm.  No gallops or rubs.  No carotid bruits.  Chest:  Equal air entry bilaterally.  No rales or rhonchi.  Abdomen:  Soft, nontender.  Pfannenstiel incision well-healed.  No evidence of  hernia.  Bowel sounds are normal without bruits.  Lower Extremities:  Femoral, popliteal, posterior tibial and dorsalis pedis pulses 2+  bilaterally.  No ankle edema.   IMPRESSION:  1. Chronic back pain associated with L4 to 5 degenerative disk      disease.  2. Hypertension.  3. History of gestational diabetes.   RECOMMENDATION:  Patient is a candidate to undergo L4-5 ALIF.  Details  of the operative procedure have been reviewed with the patient.  Potential complications include, but are not limited to, bleeding,  transfusion, limb ischemia, DVT, pulmonary embolus, ureter injury,  infection, hernia, or other major complication.  The patient understands  the potential risks and wishes to pursue surgery.   Balinda Quails, M.D.  Electronically Signed  PGH/MEDQ  D:  09/15/2007  T:  09/16/2007  Job:  [id number]   cc:   Otilio Connors. Gerri Spore,  M.D.  Jene Every, M.D.  Michelle L. Vincente Poli, M.D.

## 2010-10-28 NOTE — Op Note (Signed)
[name], [name]     ACCOUNT NO.:  [id number]   MEDICAL RECORD NO.:  [id number]          PATIENT TYPE:  INP   LOCATION:  3114                         FACILITY:  [facility]   PHYSICIAN:  Balinda Quails, M.D.    DATE OF BIRTH:  [birth year]   DATE OF PROCEDURE:  10/05/2007  DATE OF DISCHARGE:                               OPERATIVE REPORT   SURGEON:  Balinda Quails, MD.   SURGEON:  Javier Docker, MD.   ASSISTANT:  Alvy Beal, MD.   ANESTHETIC:  General endotracheal.   PREOPERATIVE DIAGNOSIS:  L4-5 degenerative disk disease.   POSTOPERATIVE DIAGNOSIS:  L4-5 degenerative disk disease.   PROCEDURE:  Attempted left retroperitoneal exposure of L4-5.   CLINICAL NOTE:  Lenzie Montesano is a 45 year old female with a  history of chronic back pain previous surgery.  Scheduled this time to  undergo L4-5 ALIF with Dr. Shelle Iron.  This operative note details left  retroperitoneal exposure.   OPERATIVE PROCEDURE:  The patient brought to the operating room in  stable condition, placed in supine position.  Dr. Shelle Iron completed  posterior instrumentation.   The patient in supine position was prepped and draped in sterile  fashion.   Transverse skin incision made at left lower quadrant with a projection  of L4-5.  Dissection carried down through subcutaneous tissue by  electrocautery.  Left anterior rectus sheath was incised transversely.  Rectus muscle mobilized.  The posterior rectus sheath was incised  transversely and the peritoneum pushed off the posterior rectus sheath  and left retroperitoneal space entered.  The left psoas muscle and  genitofemoral nerve identified and preserved.  The left common iliac  artery was skeletonized, fully mobilized, and retracted medially.  Left  common iliac vein was freed bluntly and 2 large iliolumbar veins were  ligated with 2-0 silk and divided.  At this time, it was evident there  was extensive scarring in the area of the L4-5 disk.   The left common  iliac vein was stuck to the disk and vertebral body.  A sharp dissection  used to mobilize the vein.  However, the vein was injured and did  require interrupted 4-0 Prolene suture for closure without stenosis of  the vein.  The vein was very thickened and stuck to the scar.  Several  attempts made to mobilize the vein unsuccessfully.   The ureter and peroneal contents were then rotated further medially and  the L4-5 disk was attempted to be exposed, but in the area of the crotch  of the bifurcation.  The left common iliac artery was retracted to the  left.  The left common iliac vein was retracted to the left.  The L4-5  disk could be exposed, however, adequate exposure of this  could not be obtained for placement of plate or other instrumentation.  At this time, it was decided to discontinue the left retroperitoneal  exposure due to potential risk that the patient.   Details of closure are dictated separately as carried out by the  orthopedic surgeons Dr. Shelle Iron and Dr. Shon Baton.        Balinda Quails, M.D.  Electronically Signed  PGH/MEDQ  D:  10/05/2007  T:  10/06/2007  Job:  [id number]
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
## 2010-10-31 NOTE — Op Note (Signed)
[name], [name]     ACCOUNT NO.:  [id number]   MEDICAL RECORD NO.:  [id number]          PATIENT TYPE:  INP   LOCATION:  9106                          FACILITY:  [facility]   PHYSICIAN:  Michelle L. Grewal, M.D.DATE OF BIRTH:  Nov 05, 1965   DATE OF PROCEDURE:  DATE OF DISCHARGE:                                 OPERATIVE REPORT   PREOPERATIVE DIAGNOSES:  1.  Intrauterine pregnancy at 38 weeks.  2.  Previous cesarean section.  3.  Labor and repetitive variable decelerations.  4.  Desires permanent sterilization.   POSTOPERATIVE DIAGNOSES:  1.  Intrauterine pregnancy at 38 weeks.  2.  Previous cesarean section.  3.  Labor and repetitive variable decelerations.  4.  Desires permanent sterilization.   PROCEDURES:  1.  Repeat low transverse cesarean section.  2.  Bilateral tubal ligation, modified Pomeroy method.   SURGEON:  Michelle L. Vincente Poli, M.D.   ANESTHESIA:  Spinal.   ESTIMATED BLOOD LOSS:  500 mL.   DRAINS:  Foley.   FINDINGS:  Female infant in cephalic presentation, Apgars 8 at one minute, 9  at five minutes, weighing 8 pounds, 6 ounces.   PROCEDURE IN DETAIL:  The patient was taken to the operating room.  Her  spinal was placed by anesthesia.  It was found to be adequate.  She was  prepped and draped in the usual sterile fashion.  A Foley catheter was  inserted and draining clear urine.  A low transverse incision was made and  carried down to the fascia.  The fascia was scored in the midline and  extended laterally.  The Pfannenstiel incision was created and rectus  muscles were separated in the midline.  The peritoneum was entered high and  bluntly.  The peritoneal incision was then stretched.  There were some  adhesions to the lower uterine segment in the area of her previous cesarean  section and so these were taken down carefully using Metzenbaum scissors.  The bladder blade was inserted and the lower uterine segment identified.  A  low transverse incision  was made in the uterus.  The uterus was entered  using a hemostat.  Amniotic fluid was clear.  The baby was in cephalic  presentation and it was delivered easily with one gentle pull of the vacuum  with no pop-off.  Apgars were 8 at one minute, 9 at five minutes, and weight  was 8 pounds 6 ounces.  The baby was handed off to the waiting  pediatricians.  The cord was clamped and cut.  The placenta was manually  removed and the uterus was cleared of all clots and debris.  The uterus was  exteriorized.  It was noted to be normal but very large.  The ovaries and  tubes were seen.  The uterus was closed in a single layer using 0 chromic in  a continuous running locking stitch.  It was hemostatic.  The patient had  desired permanent sterilization so at this point we performed our tubal.   We grasped each midportion of the tube using a Babcock clamp and tied off  using plain gut suture x2 and each knuckle of tissue was  then excised using  Metzenbaum scissors.  Hemostasis was noted.  The uterus was carefully  returned to the abdomen.  Irrigation was performed and all three surgical  pedicles were inspected and noted to be hemostatic.  We then closed the  peritoneum using a Vicryl in continuous running stitch.  The rectus muscles  were closed using the same 0 Vicryl.  The fascia was closed using 0 Vicryl  in a continuous running stitch, each cornua meeting in the midline.  After  irrigation of the subcutaneous layer, the skin was closed with staples.  All  sponge, lap, and instrument counts were correct x2.  The patient tolerated  the procedure well and went to recovery room in stable condition.     Mich   MLG/MEDQ  D:  05/23/2004  T:  05/24/2004  Job:  [id number]

## 2010-10-31 NOTE — Op Note (Signed)
[name], [name]     ACCOUNT NO.:  [id number]   MEDICAL RECORD NO.:  [id number]          PATIENT TYPE:  AMB   LOCATION:  DAY                          [facility]AC[facility]  [facility]   PHYSICIAN:  Jene Every, M.D.    DATE OF BIRTH:  1965/12/04   DATE OF PROCEDURE:  10/23/2004  DATE OF DISCHARGE:                                 OPERATIVE REPORT   PREOPERATIVE DIAGNOSES:  Spinal stenosis, herniated nucleus pulposus L4-5  left.   POSTOPERATIVE DIAGNOSES:  Spinal stenosis, herniated nucleus pulposus L4-5  left.   PROCEDURE:  Lateral recess decompression, foraminotomy of L5,  microdiskectomy L4-5.   ANESTHESIA:  General.   ASSISTANT:  Roma Schanz, P.A.   INDICATIONS FOR PROCEDURE:  This is a 45 year old with left lower extremity  radiculopathy L5 nerve root distribution, MRI indicating lateral recess  stenosis, positive neural tension signs on the left, EHL weakness,  refractory to conservative treatment, confirmed with epidural steroid  injection. Operative intervention was indicated for decompression of the L5  nerve root bilaterally. Recessed decompression, possible microdiskectomy.  The risks and benefits were discussed including bleeding, infection, injury  to neurovascular structures, CSF leakage, epidural fibrosis, need for  revision in the future, anesthetic complications, etc.   TECHNIQUE:  The patient in the supine position and after the induction of  adequate general anesthesia and 1 g of Kefzol, the patient was placed prone  on the [place] frame, all bony prominences well padded. The lumbar region  was prepped and draped in the usual sterile fashion. The patient is  moderately obese. Two 18 gauge spinal needles were utilized to localize the  4-5 interspace, confirmed with an x-ray. An incision was made from the  spinous process of 4-5. The subcutaneous tissue was sharply dissected,  electrocautery utilized to achieve hemostasis. The dorsolumbar fascia  identified and divided in line with the skin incision. The paraspinous  muscle elevated from the lamina of 4 and 5 on the left. A McCullough  retractor was placed, Penfield 4 placed in the intralaminar space and  confirmatory radiograph was obtained.   The wound was copiously irrigated. Hemilaminotomy on the caudad edge of 4  was performed with a 2 and a 3 mm Kerrison. Hypertrophic facet was noted.  The ligamentum flavum was detached from the cephalad edge of 4, ligamentum  flavum hypertrophy was noted as well. Foraminotomy of 5 was then performed.  Ligamentum flavum was removed from the interspace,hypertrophic facet was  decompressed to the medial border of the pedicle utilizing 2 mm Kerrison  with the nerve root gently mobilized medially. A vascular leash was noted  and bipolar electrocautery was utilized to cauterize and divide the leash to  mobilize the root medially. A focal disk herniation was noted with a small  amount in the anulus. Annulotomy was performed and a copious portion of disk  material was removed from the disk space. It was degenerated mucoid material  consistent with that. We made multiple passes with straight and upbiting  pituitary mobilizing it with a hockey stick in the subannular space as well.  This was sent to pathology for analysis. After diskectomy of all herniated  material, the  wound was copiously irrigated. Placed a hockey stick in the  foramen of 4 and 5, found to be widely patent as well as beneath the thecal  sac. We checked in the axilla and no residual compression of the root. There  was at least a centimeter of free excursion of the root medial to the  pedicle without difficulty. Bone wax was placed on the cancellous surfaces,  the wound copiously irrigated. Thrombin soaked Gelfoam was placed on the  laminotomy defect. There was no active bleeding or CSF leakage. The  McCullough retractor was removed, paraspinous muscles inspected with no  evidence of  active bleeding. The dorsolumbar fascia was reapproximated with  #1 Vicryl interrupted figure-of-eight sutures. The subcutaneous tissue was  reapproximated with 2-0 Vicryl simple sutures. The skin was reapproximated  with 4-0 subcuticular Prolene. The wound was reinforced with Steri-Strips,  sterile dressing applied, placed supine on the hospital bed, extubated  without difficulty and transported to the recovery room in satisfactory  condition.   The patient tolerated the procedure well with no complications. Assistant  Roma Schanz. There was no blood loss.      JB/MEDQ  D:  10/23/2004  T:  10/23/2004  Job:  [id number]

## 2010-10-31 NOTE — Discharge Summary (Signed)
[name], [name]     ACCOUNT NO.:  [id number]   MEDICAL RECORD NO.:  [id number]          PATIENT TYPE:  INP   LOCATION:  9106                          FACILITY:  [facility]   PHYSICIAN:  Freddy Finner, M.D.   DATE OF BIRTH:  07-11-1965   DATE OF ADMISSION:  05/23/2004  DATE OF DISCHARGE:  05/26/2004                                 DISCHARGE SUMMARY   ADMISSION DIAGNOSES:  1.  Intrauterine pregnancy at term.  2.  Labor with nonreassuring fetal heart tones.  3.  Previous cesarean delivery.  4.  Multiparity, desires sterilization.   DISCHARGE DIAGNOSES:  1.  Status post low transverse cesarean section.  2.  Viable female infant.  3.  Permanent sterilization.  4.  Status post reexploration of incision with suturing of uterine fundus.   PROCEDURE:  1.  Repeat low transverse cesarean section.  2.  Bilateral tubal ligation, modified Pomeroy procedure.  3.  Reexploration of wound with suturing of uterine fundus.   REASON FOR ADMISSION:  Please see written H&P.   HOSPITAL COURSE:  The patient is a 45 year old African American married  female, gravida 4, para 1 who was admitted to [facility] at  term in early labor.  Upon admission, the patient did complain of some  decreased fetal movement.  The patient was noted to have some irregular  contractions.  The fetus was noted to have some repetitive variable  decelerations, and given the patient's history of a previous cesarean  delivery, decision was made to proceed with a low transverse incision.  Due  to multiparity, the patient had also requested permanent sterilization.  The  patient was then transferred to the operating room where spinal anesthesia  was administered without difficulty.  A low transverse incision was made,  with delivery of a viable female infant weighing 8 pounds 6 ounces, with  Apgars of 8 at one minute and 9 at five minutes.  The patient tolerated the  procedure well and was taken to the  recovery room in stable condition.   Shortly thereafter, the patient did complain of some severe infraumbilical  pain which was unable to be controlled with the PCA pump after multiple  doses of narcotics.  Vital signs were stable.  Urine output was 100 cc over  the last hour, and hemoglobin was 11.8.  The patient was noted to be very  tender infraumbilically, and the incision was noted to have some slight  oozing, with some dark red drainage.  Concern was that the patient may have  an incisional hematoma.  Decision was made to take the patient back to the  operating room to reexplore the wound.  The patient was then transferred to  the operating room where spinal anesthesia was administered without  difficulty.  Upon reexploration of the wound, there was a small 1.5-cm  separation of the uterine serosa which was oozing actively.  This area was  sutured without incident.  The incision was now closed, and the patient was  transferred back to the recovery room in stable condition.   On postoperative day #1, the patient was without complaint.  Pain was much  improved.  Vital signs were stable.  Urine output was good.  The abdomen was  soft and nontender.  The fundus was firm and nontender.  The abdominal  dressing was noted to be clean, dry, and intact.  Labs revealed a hemoglobin  of 10.8, WBC count of 10.9, platelet count of 218.   On postoperative day #2, the patient was doing well.  She was having a  minimal amount of discomfort.  Vital signs were stable.  Blood pressure was  stable.  Urine output was good.  The uterus was firm and nontender.  The  incision was clean, dry, and intact.   On postoperative day #3, the patient was without complaint.  Vital signs  were stable.  The abdomen was soft.  The fundus was firm and nontender.  The  incision was clean, dry, and intact.  The staples were intact, with plan to  leave them in place for approximately seven additional days.  Labs  revealed  a hemoglobin of 8.9, platelet count of 242,000, WBC count of 10.7.  Discharge instructions were reviewed, and the patient was discharged home.   CONDITION ON DISCHARGE:  Stable.   DIET:  Regular as tolerated.   ACTIVITY:  No heavy lifting, no driving x2 weeks, no vaginal entry.   FOLLOW UP:  The patient is to follow up in the office in four to five days  for an incision check.  She is to call for a temperature greater than 100  degrees, persistent nausea and vomiting, heavy vaginal bleeding, and/or  redness or drainage from the incisional site.   DISCHARGE MEDICATIONS:  1.  Aldomet 500 mg one p.o. b.i.d.  2.  Tylox, #30, one p.o. every four to six hours p.r.n.  3.  Motrin 600 mg every six hours.  4.  Prenatal vitamins one p.o. daily.  5.  Colace one p.o. daily.  6.  Ferrous sulfate 325 mg one p.o. b.i.d. with meals.     [name]   CC/MEDQ  D:  06/17/2004  T:  06/17/2004  Job:  [id number]

## 2010-10-31 NOTE — Op Note (Signed)
[name], CRAYTON     ACCOUNT NO.:  [id number]   MEDICAL RECORD NO.:  [id number]          PATIENT TYPE:  INP   LOCATION:  9106                          FACILITY:  [facility]   PHYSICIAN:  Michelle L. Grewal, M.D.DATE OF BIRTH:  [birth year]   DATE OF PROCEDURE:  05/23/2004  DATE OF DISCHARGE:                                 OPERATIVE REPORT   PREOPERATIVE DIAGNOSIS:  status post cesarean section  rule out hematoma   POSTOPERATIVE DIAGNOSIS:  separation of uterine myometrium in fundus with  bleeding   PROCEDURES:  exploration of wound  repair of uterine myometrium   SURGEON:  Michelle L. Vincente Poli, M.D.   FINDINGS:  Small 1.5 centimeter separation of uterine myometrium at the  fundus at area of previous adhesion   DESCRIPTION OF PROCEDURE:  The patient went to PACU after her surgery. She  had stable vital signs, was afebrile and making good urine.  I arrived at  the PACU and examined the patient.  She was in significant pain, which was  completely localized to the fundal area.  She was slightly distended and was  tender in approximately in the area of the fundus.  Upon pressure, I did  notice a moderate amount of oozing dark red blood which appeared to be  venous from the incision.  The hemoglobin was 11.8.  My impression was that  the patient might have an incisional hematoma or a bleeder in the  subcutaneous tissue or in the rectus fascia.  Because of her pain, I felt it  necessary the go back for reexploration.  She and her husband were consulted  in regards to this, have consented and they agreed to proceed.  She was  taken back to the operating room.  A spinal was placed again by Dr. Harvest Forest  without incident.  She was prepped and draped.  Foley catheter remained in  place.  The staples were removed from the incision.  Upon opening the  incision, there was noted to be some moderate dark red blood, but no clot  noted in the subcutaneous area.  The clot was clear and no  active bleeding  was noted into the subcutaneous tissue.  The fascia was opened again using  suture scissors.  The subfascial region was explored and again there was a  moderate amount of bleeding noted.  It appeared to be coming up through the  peritoneum.  The peritoneum was then opened using suture scissors.  There  was noted to be a moderate amount of watery blood which was dark in origin  with no clot, but seemed to be coming from the lower abdominopelvic area.  Irrigation was performed and careful exploration of all surgical pedicles  was performed.  I first inspected the uterine incision and it was completely  hemostatic.  I then secondly completely inspected both tubal ligation sites  and they were completely hemostatic.  The only area of oozing that I could  find was coming from the uterine fundus on the anterior surface,  approximately a 1.5 cm area with a separation of the uterine serosa and the  myometrium.  There was steady venous oozing from the  incision.  Actually it  was more pronounced when you massaged the uterus.  I then placed PDS #1  figure-of-eight to this area and hemostasis was noted.  I think there was a  venous sinus in this region that started bleeding.  It is possible this  could have occurred after the uterus was replaced back in the abdomen or  possibly some adhesions were torn away when the C-section was performed  because of the previous cesarean section.  However, after I repaired this  separation, I again performed irrigation copiously and inspected again the  other incisions and again noted hemostasis.  The peritoneum was again closed  using 0 Vicryl continuous running stitch and the rectus muscles were  reapproximated using 0 Vicryl.  The irrigation of the subfascial region  again was performed.  Hemostasis at this area was noted and the fascia was  closed using 0 Vicryl continuous running stitch starting at each corner and  meeting in  the midline.   After irrigation of the subcutaneous layer and noting  hemostasis, the skin was closed with staples.  The patient tolerated the  procedure extremely well.  All sponge, lap and instrument counts were  correct x2.  The patient tolerated the procedure well and went to the  recovery room in stable condition.     Mich   MLG/MEDQ  D:  05/23/2004  T:  05/24/2004  Job:  [id number]

## 2010-10-31 NOTE — Discharge Summary (Signed)
[name], [name]     ACCOUNT NO.:  [id number]   MEDICAL RECORD NO.:  [id number]          PATIENT TYPE:  INP   LOCATION:  9305                          FACILITY:  [facility]   PHYSICIAN:  Gerald Leitz, MD          DATE OF BIRTH:  Jan 11, 1966   DATE OF ADMISSION:  06/12/2008  DATE OF DISCHARGE:  06/14/2008                               DISCHARGE SUMMARY   ADMISSION DIAGNOSIS:  Menorrhagia.   DISCHARGE DIAGNOSES:  1. Menorrhagia.  2. Pelvic adhesive disease.  3. Total abdominal hysterectomy with extensive lysis of adhesions.   BRIEF HOSPITAL COURSE:  The patient was admitted on June 12, 2008.  She underwent a total abdominal hysterectomy, extensive lysis of  adhesions secondary to menorrhagia and pelvic adhesive disease.  She did  well postoperatively.  A hemoglobin on postop day #1 was 10.1.  She was  discharged home on June 14, 2008, with following medications  Dilaudid and Motrin.  She was to follow up for staple removal in our  office.   CONDITION AT DISCHARGE:  Stable and improved.      Gerald Leitz, MD  Electronically Signed     TC/MEDQ  D:  08/21/2008  T:  08/22/2008  Job:  [id number]
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
## 2010-10-31 NOTE — Op Note (Signed)
[name], [name]     ACCOUNT NO.:  [id number]   MEDICAL RECORD NO.:  [id number]          PATIENT TYPE:  AMB   LOCATION:  DAY                          FACILITY:  [facility]   PHYSICIAN:  Jene Every, M.D.    DATE OF BIRTH:  03-07-1966   DATE OF PROCEDURE:  10/01/2006  DATE OF DISCHARGE:                               OPERATIVE REPORT   PREOPERATIVE DIAGNOSIS:  Carpal tunnel syndrome, right.   POSTOPERATIVE DIAGNOSIS:  Carpal tunnel syndrome, right.   PROCEDURE PERFORMED:  Right carpal tunnel release.   ANESTHESIA:  General.   ASSISTANT:  Strader   BRIEF HISTORY AND INDICATIONS:  A 45 year old with refractory carpal  tunnel syndrome in the right hand and upper extremity, positive carpal  compression test confirmed by nerve conduction study.  Partial relief  from a corticosteroid injection was indicated for decompression of the  carpal canal and of the median nerve.  Risks and benefits discussed  including bleeding, infection, damage to neurovascular structures,  fibrosis, recurrent carpal tunnel syndrome, etc.   TECHNIQUE:  The patient in supine position.  After the induction of  adequate general anesthesia, 2 g of Kefzol, the right upper extremity  was prepped and draped in the usual sterile fashion and exsanguinated  with arm tourniquet inflated to 200 mmHg.  Form was supinated, placed in  hand holder.  I made an incision just ulnar to the thenar crease  approximately 1 mm.  Extended it approximately 3 cm to the distal wrist  crease.  Subcutaneous tissue was dissected.  Electrocautery was utilized  to achieve hemostasis.  She had an abundance of cutaneous adipose tissue  which was excised.  We identified the transverse carpal ligament, and we  incised it to its ulnarward attachment, utilizing a 15 blade.  We first  incised this in the middle until we were able to get to the canal, and  then this was meticulously extended distally and proximally.  Distally  due to  the fairly large hand, extended the skin incision just a bit more  to better visualize the end of the transverse carpal ligament which I  identified with a Penfield.  I then incised the transverse carpal  ligament and gently spread that with a hemostat.  Noted initially was  spherica constriction of the median nerve with hypertrophic  tenosynovitis noted as well.  Typical hour-glass appearance was noted.  Then irrigated that, and I then prepared to extend and divide the flexor  retinaculum proximally.  I did visualize this and gently flexed the  wrist just slightly above neutral.  Using Oregon tome set and the first  dilator to beneath the flexor retinaculum and then the second dilator  which had some difficulty passing in the subcutaneous tissue.  There was  some fibrotic tissue there that I released with tenotomy scissors and  then passed the second and the third Oregon tome.  I then used the  final portion of the Oregon tome with stylet inserted.  This was then  inserted with the upper and lower prong over the flexor retinaculum.  We  used the [place] tome then through that stylet dividing the remainder of  the flexor retinaculum.  This was without difficulty.  Again, we were  ulnar to the motor recurrent branch.  I lysed some synovitis.  I  copiously irrigated the wound.  I palpated along its course.  It was  released in its entirety.  I then closed a loose stitch in the subcu due  to the size of her hand and then 4-0 running vertical mattress suture  for the skin.  When we divided the flexor retinaculum, I did it in a  slightly ulnarward direction ulnar to the palmaris longus so that we did  not interfere with the palmar cutaneous branch of the median  nerve.  Next, I placed her in a sterile dressing with the wrist in  neutral and secured with a volar splint and Ace bandage.  Prior to that,  the tourniquet had been deflated and with adequate vascularization of  the lower extremity  appreciated.  The patient tolerated the procedure  well.  There were no complications.  Minimal blood loss.      Jene Every, M.D.  Electronically Signed     JB/MEDQ  D:  10/01/2006  T:  10/01/2006  Job:  [id number]

## 2010-10-31 NOTE — H&P (Signed)
NAMEKORYN, [name]           ACCOUNT NO.:  [id number]   MEDICAL RECORD NO.:  [id number]           PATIENT TYPE:   LOCATION:                                 FACILITY:   PHYSICIAN:  Duke Salvia. Marcelle Overlie, M.D.    DATE OF BIRTH:   DATE OF ADMISSION:  DATE OF DISCHARGE:                                HISTORY & PHYSICAL   CHIEF COMPLAINT:  Left flank pain, postpartum.   HISTORY OF PRESENT ILLNESS:  This 45 year old G4, P2-0-2-2, who is five days  postoperative CS, was actually discharged yesterday on Aldomet, Percocet,  and Motrin, and was presented back to the office today complaining of severe  left flank pain, which is a new type of pain for her.  The Percocet was not  keeping her comfortable.  Cath urine in the office showed that she had 2+  blood and 1+ protein and was admitted for further evaluation including  spiral CT to rule out a stone or obstruction, plus further evaluation.   PAST MEDICAL HISTORY:  1.  She has a history of hypertension.  She has been on Aldomet during her      pregnancy and also postpartum.  2.  Also a history of gestational diabetes.  3.  Previous section, again delivered by repeat cesarean section five days      ago.   PAST MEDICAL HISTORY:  Significant for seasonal allergies.  She uses an  albuterol inhaler p.r.n.  She stopped smoking May 2004.   FAMILY HISTORY:  Significant for her mother who is insulin dependent  diabetic and her father who is on oral medications.  There is also family  history of MI in father and paternal grandfather and hypertension.   PAST SURGICAL HISTORY:  1.  She had laparoscopy in 1997.  2.  Toe surgery.  3.  Cesarean section x2.   PHYSICAL EXAMINATION:  VITAL SIGNS:  Temperature 99.9. blood pressure  140/80.  HEENT:  Unremarkable.  NECK:  Supple without masses.  LUNGS:  Clear.  CARDIOVASCULAR:  Regular rate and rhythm without murmurs, rubs, gallops.  BREASTS:  Not examined.  ABDOMEN:  Soft, flat, nontender.  There  is some left CVA tenderness,  although this is not severe.  The incision itself was intact.  The staples  are still present.  No evidence of drainage or cellulitis.  PELVIC:  Exam deferred.   IMPRESSION:  Postoperative day #5, repeat cesarean section with left flank  pain, rule out pyelonephritis, kidney stone, or obstruction.   PLAN:  Will admit for spiral CT, pain management with further evaluation.     Rich   RMH/MEDQ  D:  05/27/2004  T:  05/27/2004  Job:  [id number]

## 2010-10-31 NOTE — Op Note (Signed)
   NAME:  [name], [name]               ACCOUNT NO.:  [id number]   MEDICAL RECORD NO.:  [id number]                   PATIENT TYPE:  AMB   LOCATION:  SDC                                  FACILITY:  [facility]   PHYSICIAN:  Freddy Finner, M.D.                DATE OF BIRTH:  [birth year]   DATE OF PROCEDURE:  02/01/2003  DATE OF DISCHARGE:                                 OPERATIVE REPORT   PREOPERATIVE DIAGNOSIS:  Missed abortion.   POSTOPERATIVE DIAGNOSIS:  Missed abortion.   OPERATION PERFORMED:  Dilatation and evacuation.   ANESTHESIA AND SEDATION:  Paracervical block.   SURGEON:  Freddy Finner, M.D.   INTRAOPERATIVE COMPLICATIONS:  None.   ESTIMATED BLOOD LOSS:  Estimated intraoperative blood loss 30 mL or less.   INDICATIONS FOR SURGERY:  The patient is a 45 year old gravida 2, para 1 who  was found to have a nonviable pregnancy following an episode of vaginal  bleeding on the day prior to surgery.  On that day she was given RhoGAM and  she was scheduled for D&E.  She is now admitted for D&E.   DESCRIPTION OF OPERATION:  The patient was admitted on the morning of  surgery and brought to the operating room, placed under adequate intravenous  sedation and placed in the dorsal lithotomy position using Allen stirrups.  Betadine prep of the perineum and vagina was carried out in the usual  fashion.  Bivalved speculum was introduced.  Cervix was grasped with a  single-toothed tenaculum.  A paracervical block was placed using  approximately 8 mL of 1% plain Xylocaine with injections at 4 and 8 o'clock  in the vaginal fornices.  Cervix was progressively dilated to 29 with  Pratt's.  An 8 mm suction cannula was introduced.  Aspiration produced  obvious parts of conception.  This was continued until it was felt the  cavity was completely evacuated, which was confirmed by gentle sharp  curettage and exploration with Randall stone forceps followed by repeat  vacuum aspiration.   At this point the procedure was terminated, instruments were removed and the  patient was awakened and taken to the recovery in good condition.  She will  be discharged home with routine outpatient surgical instructions for follow  up in the office in approximately 10 days to two weeks.  She is to take  ibuprofen as needed for postoperative pain.  She has Xanax 0.25 mg to be  taken 1 t.i.d. as needed at home.                                                 Freddy Finner, M.D.    WRN/MEDQ  D:  02/01/2003  T:  02/01/2003  Job:  [id number]

## 2010-11-21 ENCOUNTER — Other Ambulatory Visit (HOSPITAL_COMMUNITY): Payer: Self-pay | Admitting: Orthopedic Surgery

## 2010-11-21 ENCOUNTER — Encounter (HOSPITAL_COMMUNITY)
Admission: RE | Admit: 2010-11-21 | Discharge: 2010-11-21 | Disposition: A | Discharge status: Home / Self Care | Payer: BC Managed Care – PPO | Source: Ambulatory Visit | Attending: Orthopedic Surgery | Admitting: Orthopedic Surgery

## 2010-11-21 DIAGNOSIS — M545 Low back pain, unspecified: Secondary | ICD-10-CM

## 2010-11-21 LAB — CBC
Hemoglobin: 12.6 g/dL (ref 12.0–15.0)
MCH: 27.9 pg (ref 26.0–34.0)
MCV: 84.9 fL (ref 78.0–100.0)
Platelets: 315 10*3/uL (ref 150–400)
RBC: 4.51 MIL/uL (ref 3.87–5.11)

## 2010-11-21 LAB — BASIC METABOLIC PANEL
CO2: 34 mEq/L — ABNORMAL HIGH (ref 19–32)
Calcium: 9.7 mg/dL (ref 8.4–10.5)
Creatinine, Ser: 0.58 mg/dL (ref 0.4–1.2)
Glucose, Bld: 99 mg/dL (ref 70–99)

## 2010-11-26 ENCOUNTER — Ambulatory Visit (HOSPITAL_COMMUNITY)
Admission: RE | Admit: 2010-11-26 | Discharge: 2010-11-27 | DRG: 461 | Disposition: A | Discharge status: Home / Self Care | Payer: BC Managed Care – PPO | Source: Ambulatory Visit | Attending: Orthopedic Surgery | Admitting: Orthopedic Surgery

## 2010-11-26 ENCOUNTER — Ambulatory Visit (HOSPITAL_COMMUNITY): Payer: BC Managed Care – PPO

## 2010-11-26 DIAGNOSIS — Z01818 Encounter for other preprocedural examination: Secondary | ICD-10-CM | POA: Insufficient documentation

## 2010-11-26 DIAGNOSIS — M549 Dorsalgia, unspecified: Secondary | ICD-10-CM | POA: Insufficient documentation

## 2010-11-26 DIAGNOSIS — Z981 Arthrodesis status: Secondary | ICD-10-CM | POA: Insufficient documentation

## 2010-11-26 DIAGNOSIS — G8929 Other chronic pain: Secondary | ICD-10-CM | POA: Insufficient documentation

## 2010-11-26 DIAGNOSIS — Z126 Encounter for screening for malignant neoplasm of bladder: Secondary | ICD-10-CM | POA: Insufficient documentation

## 2010-11-26 LAB — GLUCOSE, CAPILLARY: Glucose-Capillary: 148 mg/dL — ABNORMAL HIGH (ref 70–99)

## 2010-11-27 LAB — GLUCOSE, CAPILLARY: Glucose-Capillary: 178 mg/dL — ABNORMAL HIGH (ref 70–99)

## 2010-11-30 NOTE — Op Note (Signed)
[name], [name]     ACCOUNT NO.:  [id number]  MEDICAL RECORD NO.:  [id number]  LOCATION:  5003                         FACILITY:  [facility]  PHYSICIAN:  Alvy Beal, MD    DATE OF BIRTH:  05/17/1966  DATE OF PROCEDURE:  11/26/2010 DATE OF DISCHARGE:                              OPERATIVE REPORT   PREOPERATIVE DIAGNOSIS:  Chronic back pain (failed back syndrome).  POSTOPERATIVE DIAGNOSIS:  Chronic back pain (failed back syndrome).  PROCEDURE:  Implantation of spinal cord stimulator.  COMPLICATIONS:  None.  CONDITION:  Stable.  INSTRUMENTATION SYSTEM USED:  St. Jude Tripole 16-lead insert with a standard Scientific laboratory technician.  HISTORY:  This is a very pleasant 45 year old woman has had multiple low back operations and still has chronic debilitating pain.  She had a spinal cord stimulator in the past and noted significant improvement. After long discussion, she elected to proceed with permanent plantation. All appropriate risks, benefits, and alternatives were discussed with the patient.  Consent was obtained.  OPERATIVE NOTE:  The patient was brought to the operating room, placed supine on the operating table.  After successful induction of general anesthesia, endotracheal intubation, TEDs, SCDs were placed, the patient was turned prone onto the Wilson frame and the back was prepped and draped in a standard fashion.  Appropriate time-out was done to confirm patient procedure and all other important and pertinent data.  Once this was done, I identified the L4-5 disk space.  This was the level that she had the instrumented fusion.  I then counted up until I found the T12 vertebral body, confirmed this with AP and lateral views as this was the last rib bearing vertebrae.  I made incision starting at T12 and proceeding up to the superior aspect of T11.  Sharp dissection was carried out down to the deep fascia.  The deep fascia was sharply incised on  either side of the spinous process and exposed the posterior elements at T11 and T12.  I confirmed again with AP and lateral x-rays at appropriate level.  Once this was done, I used a Leksell rongeur to remove the spinous process of T11.  I then used fine nerve hook to develop plane underneath the lamina, then used a 2-mm Kerrison to perform a generous laminotomy of T11.  I removed the overlying ligamentum flavum and expose the underlying thecal sac.  At this point, I and gently inserted a Woodson elevator to ensure that I created an adequate space between the thecal sac and the posterior elements.  Once this was done, I then passed the dural spatula up.  This to be passed with ease.  There was no significant tension and there was no tension on the thecal sac.  I then passed the 16-lead and positioned it slightly to the left as this was her symptomatic side.  I had one lead posterior aspect of T9 and then posterior inferior aspect of T9 and across the entire T10 vertebral body and across T10-T11 disk space.  This was the same as the programmed location when the trial was done.  At this point, once I confirmed satisfactory position in both AP and lateral planes, I then sutured the lead to directly to the T12  spinous process through bone holes with FiberWire.  I then rounded it through the interspinous process ligament at T12-L1.  I then made a second incision on the right side for the battery, I made a pocket that was 2.5 cm deep and then used the submuscular trocar to pass the leads from the thoracic wound down to the gluteal wound.  I then secured in place, placed the leads into the appropriate slides in the battery and torqued it down.  I then placed the battery into the wound and sutured it to the deep fascia with two #1 interrupted Vicryl sutures.  I then tested the battery in a satisfactory.  The wrap indicated that all leads were functioning.  And the battery was functioning.  At  this point, both wounds were copiously irrigated with normal saline.  Deep fascia was closed with #1 Vicryl suture, superficial 2-0 Vicryl suture, and 3-0 Monocryl for the skin. Steri-Strips and dry dressing were applied.  The patient was extubated, transferred to PACU without incident.  At the end of the case, all needle and sponge counts were correct.     Alvy Beal, MD     DDB/MEDQ  D:  11/26/2010  T:  11/27/2010  Job:  [id number]  Electronically Signed by Venita Lick MD on 11/30/2010 07:23:17 AM

## 2011-03-10 LAB — HEMOGLOBIN AND HEMATOCRIT, BLOOD
HCT: 26.2 — ABNORMAL LOW
Hemoglobin: 8.6 — ABNORMAL LOW

## 2011-03-10 LAB — CBC
HCT: 34.2 — ABNORMAL LOW
Hemoglobin: 11.3 — ABNORMAL LOW
MCHC: 33
MCHC: 33.3
MCHC: 33.5
MCHC: 33.9
MCV: 81.9
MCV: 82.1
Platelets: 203
Platelets: 340
RBC: 3.19 — ABNORMAL LOW
RBC: 3.49 — ABNORMAL LOW
RBC: 3.55 — ABNORMAL LOW
RBC: 4.18
RDW: 14.1
RDW: 14.6
RDW: 14.9
WBC: 14.1 — ABNORMAL HIGH
WBC: 8.6
WBC: 9

## 2011-03-10 LAB — TYPE AND SCREEN
ABO/RH(D): AB NEG
Antibody Screen: NEGATIVE

## 2011-03-10 LAB — CROSSMATCH
ABO/RH(D): AB NEG
Antibody Screen: NEGATIVE

## 2011-03-10 LAB — BASIC METABOLIC PANEL
BUN: <1 — ABNORMAL LOW
CO2: 24
CO2: 27
CO2: 35 — ABNORMAL HIGH
Calcium: 7.7 — ABNORMAL LOW
Calcium: 8 — ABNORMAL LOW
Chloride: 104
Chloride: 108
Creatinine, Ser: 0.6
Creatinine, Ser: 0.65
Creatinine, Ser: 0.82
GFR calc Af Amer: >60
GFR calc Af Amer: >60
GFR calc Af Amer: >60
GFR calc Af Amer: >60
GFR calc non Af Amer: >60
GFR calc non Af Amer: >60
Glucose, Bld: 164 — ABNORMAL HIGH
Glucose, Bld: 175 — ABNORMAL HIGH
Potassium: 4.2
Sodium: 138

## 2011-03-10 LAB — URINALYSIS, ROUTINE W REFLEX MICROSCOPIC
Bilirubin Urine: NEGATIVE
Hgb urine dipstick: NEGATIVE
Nitrite: NEGATIVE
Specific Gravity, Urine: 1.027
Urobilinogen, UA: 1
pH: 6.5

## 2011-03-10 LAB — COMPREHENSIVE METABOLIC PANEL
ALT: 27
Albumin: 4.1
Alkaline Phosphatase: 56
Potassium: 3.6
Sodium: 138
Total Protein: 7

## 2011-03-10 LAB — ABO/RH: ABO/RH(D): AB NEG

## 2011-03-10 LAB — PROTIME-INR: INR: 1

## 2011-03-10 LAB — POCT I-STAT 4, (NA,K, GLUC, HGB,HCT): Hemoglobin: 11.6 — ABNORMAL LOW

## 2011-03-10 LAB — APTT: aPTT: 27

## 2011-03-10 LAB — DIFFERENTIAL
Basophils Relative: 1
Eosinophils Absolute: 0.1
Monocytes Absolute: 0.4
Monocytes Relative: 6
Neutro Abs: 3.9

## 2011-03-19 LAB — BASIC METABOLIC PANEL
CO2: 31 mEq/L (ref 19–32)
Calcium: 9.4 mg/dL (ref 8.4–10.5)
GFR calc Af Amer: >60 mL/min (ref >60)
Sodium: 138 mEq/L (ref 135–145)

## 2011-03-19 LAB — CBC
Hemoglobin: 12.2 g/dL (ref 12.0–15.0)
MCHC: 33.3 g/dL (ref 30.0–36.0)
Platelets: 273 10*3/uL (ref 150–400)
RBC: 4.33 MIL/uL (ref 3.87–5.11)
WBC: 11 10*3/uL — ABNORMAL HIGH (ref 4.0–10.5)

## 2011-03-19 LAB — TYPE AND SCREEN
ABO/RH(D): AB NEG
Antibody Screen: NEGATIVE

## 2011-03-19 LAB — ABO/RH: ABO/RH(D): AB NEG

## 2011-03-19 LAB — URINALYSIS, ROUTINE W REFLEX MICROSCOPIC
Glucose, UA: NEGATIVE mg/dL
Hgb urine dipstick: NEGATIVE
Protein, ur: NEGATIVE mg/dL
Specific Gravity, Urine: 1.01 (ref 1.005–1.030)

## 2011-06-17 DIAGNOSIS — I10 Essential (primary) hypertension: Secondary | ICD-10-CM | POA: Diagnosis not present

## 2011-06-17 DIAGNOSIS — R51 Headache: Secondary | ICD-10-CM | POA: Diagnosis not present

## 2011-06-17 DIAGNOSIS — F329 Major depressive disorder, single episode, unspecified: Secondary | ICD-10-CM | POA: Diagnosis not present

## 2011-06-23 DIAGNOSIS — I10 Essential (primary) hypertension: Secondary | ICD-10-CM | POA: Diagnosis not present

## 2011-06-23 DIAGNOSIS — J0100 Acute maxillary sinusitis, unspecified: Secondary | ICD-10-CM | POA: Diagnosis not present

## 2011-06-23 DIAGNOSIS — G894 Chronic pain syndrome: Secondary | ICD-10-CM | POA: Diagnosis not present

## 2011-06-23 DIAGNOSIS — M961 Postlaminectomy syndrome, not elsewhere classified: Secondary | ICD-10-CM | POA: Diagnosis not present

## 2011-07-07 DIAGNOSIS — I10 Essential (primary) hypertension: Secondary | ICD-10-CM | POA: Diagnosis not present

## 2011-07-15 ENCOUNTER — Other Ambulatory Visit: Payer: Self-pay | Admitting: Obstetrics and Gynecology

## 2011-07-15 DIAGNOSIS — Z1231 Encounter for screening mammogram for malignant neoplasm of breast: Secondary | ICD-10-CM

## 2011-07-16 ENCOUNTER — Ambulatory Visit
Admission: RE | Admit: 2011-07-16 | Discharge: 2011-07-16 | Disposition: A | Discharge status: Home / Self Care | Payer: BC Managed Care – PPO | Source: Ambulatory Visit | Attending: Obstetrics and Gynecology | Admitting: Obstetrics and Gynecology

## 2011-07-16 DIAGNOSIS — Z1231 Encounter for screening mammogram for malignant neoplasm of breast: Secondary | ICD-10-CM

## 2011-10-13 DIAGNOSIS — R635 Abnormal weight gain: Secondary | ICD-10-CM | POA: Diagnosis not present

## 2011-10-13 DIAGNOSIS — Z79899 Other long term (current) drug therapy: Secondary | ICD-10-CM | POA: Diagnosis not present

## 2011-10-13 DIAGNOSIS — E669 Obesity, unspecified: Secondary | ICD-10-CM | POA: Diagnosis not present

## 2011-10-13 DIAGNOSIS — I10 Essential (primary) hypertension: Secondary | ICD-10-CM | POA: Diagnosis not present

## 2011-10-27 DIAGNOSIS — R51 Headache: Secondary | ICD-10-CM | POA: Diagnosis not present

## 2011-10-27 DIAGNOSIS — I10 Essential (primary) hypertension: Secondary | ICD-10-CM | POA: Diagnosis not present

## 2011-11-13 DIAGNOSIS — F329 Major depressive disorder, single episode, unspecified: Secondary | ICD-10-CM | POA: Diagnosis not present

## 2011-11-13 DIAGNOSIS — I10 Essential (primary) hypertension: Secondary | ICD-10-CM | POA: Diagnosis not present

## 2011-11-13 DIAGNOSIS — E669 Obesity, unspecified: Secondary | ICD-10-CM | POA: Diagnosis not present

## 2011-11-13 DIAGNOSIS — R51 Headache: Secondary | ICD-10-CM | POA: Diagnosis not present

## 2011-11-13 DIAGNOSIS — T148XXA Other injury of unspecified body region, initial encounter: Secondary | ICD-10-CM | POA: Diagnosis not present

## 2011-12-09 ENCOUNTER — Other Ambulatory Visit: Payer: Self-pay | Admitting: Obstetrics and Gynecology

## 2011-12-09 DIAGNOSIS — N644 Mastodynia: Secondary | ICD-10-CM | POA: Diagnosis not present

## 2011-12-11 DIAGNOSIS — F329 Major depressive disorder, single episode, unspecified: Secondary | ICD-10-CM | POA: Diagnosis not present

## 2011-12-11 DIAGNOSIS — I10 Essential (primary) hypertension: Secondary | ICD-10-CM | POA: Diagnosis not present

## 2011-12-15 ENCOUNTER — Ambulatory Visit
Admission: RE | Admit: 2011-12-15 | Discharge: 2011-12-15 | Disposition: A | Discharge status: Home / Self Care | Payer: BC Managed Care – PPO | Source: Ambulatory Visit | Attending: Obstetrics and Gynecology | Admitting: Obstetrics and Gynecology

## 2011-12-15 DIAGNOSIS — N644 Mastodynia: Secondary | ICD-10-CM

## 2012-03-10 DIAGNOSIS — Z23 Encounter for immunization: Secondary | ICD-10-CM | POA: Diagnosis not present

## 2012-03-18 DIAGNOSIS — B379 Candidiasis, unspecified: Secondary | ICD-10-CM | POA: Diagnosis not present

## 2012-03-18 DIAGNOSIS — N760 Acute vaginitis: Secondary | ICD-10-CM | POA: Diagnosis not present

## 2012-03-28 DIAGNOSIS — J069 Acute upper respiratory infection, unspecified: Secondary | ICD-10-CM | POA: Diagnosis not present

## 2012-04-12 DIAGNOSIS — Z01419 Encounter for gynecological examination (general) (routine) without abnormal findings: Secondary | ICD-10-CM | POA: Diagnosis not present

## 2012-04-12 DIAGNOSIS — B379 Candidiasis, unspecified: Secondary | ICD-10-CM | POA: Diagnosis not present

## 2012-04-12 DIAGNOSIS — N951 Menopausal and female climacteric states: Secondary | ICD-10-CM | POA: Diagnosis not present

## 2012-04-12 DIAGNOSIS — R319 Hematuria, unspecified: Secondary | ICD-10-CM | POA: Diagnosis not present

## 2012-05-04 DIAGNOSIS — H40009 Preglaucoma, unspecified, unspecified eye: Secondary | ICD-10-CM | POA: Diagnosis not present

## 2012-05-04 DIAGNOSIS — T1510XA Foreign body in conjunctival sac, unspecified eye, initial encounter: Secondary | ICD-10-CM | POA: Diagnosis not present

## 2012-05-04 DIAGNOSIS — S139XXA Sprain of joints and ligaments of unspecified parts of neck, initial encounter: Secondary | ICD-10-CM | POA: Diagnosis not present

## 2012-05-10 ENCOUNTER — Emergency Department (INDEPENDENT_AMBULATORY_CARE_PROVIDER_SITE_OTHER)
Admission: EM | Admit: 2012-05-10 | Discharge: 2012-05-10 | Disposition: A | Discharge status: Home / Self Care | Payer: BC Managed Care – PPO | Source: Home / Self Care | Attending: Family Medicine | Admitting: Family Medicine

## 2012-05-10 ENCOUNTER — Encounter (HOSPITAL_COMMUNITY): Payer: Self-pay | Admitting: Emergency Medicine

## 2012-05-10 DIAGNOSIS — IMO0002 Reserved for concepts with insufficient information to code with codable children: Secondary | ICD-10-CM

## 2012-05-10 DIAGNOSIS — Z041 Encounter for examination and observation following transport accident: Secondary | ICD-10-CM

## 2012-05-10 DIAGNOSIS — S46912A Strain of unspecified muscle, fascia and tendon at shoulder and upper arm level, left arm, initial encounter: Secondary | ICD-10-CM

## 2012-05-10 HISTORY — DX: Unspecified asthma, uncomplicated: J45.909

## 2012-05-10 HISTORY — DX: Essential (primary) hypertension: I10

## 2012-05-10 HISTORY — DX: Type 2 diabetes mellitus without complications: E11.9

## 2012-05-10 MED ORDER — DICLOFENAC SODIUM 1 % TD GEL: 4.0000 g | Freq: Four times a day (QID) | TRANSDERMAL | Status: DC
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
## 2012-05-17 DIAGNOSIS — H40009 Preglaucoma, unspecified, unspecified eye: Secondary | ICD-10-CM | POA: Diagnosis not present

## 2012-06-23 ENCOUNTER — Other Ambulatory Visit: Payer: Self-pay | Admitting: Obstetrics and Gynecology

## 2012-06-23 DIAGNOSIS — Z1231 Encounter for screening mammogram for malignant neoplasm of breast: Secondary | ICD-10-CM

## 2012-07-25 ENCOUNTER — Ambulatory Visit: Payer: BC Managed Care – PPO

## 2012-07-26 ENCOUNTER — Ambulatory Visit
Admission: RE | Admit: 2012-07-26 | Discharge: 2012-07-26 | Disposition: A | Discharge status: Home / Self Care | Payer: BC Managed Care – PPO | Source: Ambulatory Visit | Attending: Obstetrics and Gynecology | Admitting: Obstetrics and Gynecology

## 2012-07-26 ENCOUNTER — Ambulatory Visit: Payer: BC Managed Care – PPO

## 2012-07-26 DIAGNOSIS — Z1231 Encounter for screening mammogram for malignant neoplasm of breast: Secondary | ICD-10-CM

## 2012-08-11 ENCOUNTER — Other Ambulatory Visit: Payer: Self-pay | Admitting: Cardiology

## 2012-08-11 DIAGNOSIS — M79604 Pain in right leg: Secondary | ICD-10-CM

## 2012-08-12 ENCOUNTER — Ambulatory Visit
Admission: RE | Admit: 2012-08-12 | Discharge: 2012-08-12 | Disposition: A | Discharge status: Home / Self Care | Payer: Medicare Other | Source: Ambulatory Visit | Attending: Cardiology | Admitting: Cardiology

## 2012-08-12 DIAGNOSIS — M79604 Pain in right leg: Secondary | ICD-10-CM

## 2012-10-06 DIAGNOSIS — I10 Essential (primary) hypertension: Secondary | ICD-10-CM | POA: Diagnosis not present

## 2012-10-18 DIAGNOSIS — H40009 Preglaucoma, unspecified, unspecified eye: Secondary | ICD-10-CM | POA: Diagnosis not present

## 2012-10-26 DIAGNOSIS — J45901 Unspecified asthma with (acute) exacerbation: Secondary | ICD-10-CM | POA: Diagnosis not present

## 2012-11-09 DIAGNOSIS — I10 Essential (primary) hypertension: Secondary | ICD-10-CM | POA: Diagnosis not present

## 2012-11-09 DIAGNOSIS — E669 Obesity, unspecified: Secondary | ICD-10-CM | POA: Diagnosis not present

## 2012-11-09 DIAGNOSIS — G4733 Obstructive sleep apnea (adult) (pediatric): Secondary | ICD-10-CM | POA: Diagnosis not present

## 2012-11-17 DIAGNOSIS — B3789 Other sites of candidiasis: Secondary | ICD-10-CM | POA: Diagnosis not present

## 2012-11-17 DIAGNOSIS — E119 Type 2 diabetes mellitus without complications: Secondary | ICD-10-CM | POA: Diagnosis not present

## 2012-12-22 DIAGNOSIS — B372 Candidiasis of skin and nail: Secondary | ICD-10-CM | POA: Diagnosis not present

## 2012-12-29 DIAGNOSIS — I10 Essential (primary) hypertension: Secondary | ICD-10-CM | POA: Diagnosis not present

## 2013-01-03 ENCOUNTER — Emergency Department (HOSPITAL_COMMUNITY): Payer: BC Managed Care – PPO

## 2013-01-03 ENCOUNTER — Encounter (HOSPITAL_COMMUNITY): Payer: Self-pay

## 2013-01-03 ENCOUNTER — Emergency Department (HOSPITAL_COMMUNITY)
Admission: EM | Admit: 2013-01-03 | Discharge: 2013-01-03 | Disposition: A | Discharge status: Home / Self Care | Payer: BC Managed Care – PPO | Attending: Emergency Medicine | Admitting: Emergency Medicine

## 2013-01-03 ENCOUNTER — Other Ambulatory Visit: Payer: Self-pay

## 2013-01-03 DIAGNOSIS — Z87891 Personal history of nicotine dependence: Secondary | ICD-10-CM | POA: Insufficient documentation

## 2013-01-03 DIAGNOSIS — M7989 Other specified soft tissue disorders: Secondary | ICD-10-CM | POA: Diagnosis not present

## 2013-01-03 DIAGNOSIS — R0989 Other specified symptoms and signs involving the circulatory and respiratory systems: Secondary | ICD-10-CM | POA: Diagnosis not present

## 2013-01-03 DIAGNOSIS — H539 Unspecified visual disturbance: Secondary | ICD-10-CM

## 2013-01-03 DIAGNOSIS — H538 Other visual disturbances: Secondary | ICD-10-CM | POA: Diagnosis not present

## 2013-01-03 DIAGNOSIS — I10 Essential (primary) hypertension: Secondary | ICD-10-CM | POA: Diagnosis not present

## 2013-01-03 DIAGNOSIS — J45901 Unspecified asthma with (acute) exacerbation: Secondary | ICD-10-CM | POA: Diagnosis not present

## 2013-01-03 DIAGNOSIS — Z79899 Other long term (current) drug therapy: Secondary | ICD-10-CM | POA: Insufficient documentation

## 2013-01-03 DIAGNOSIS — Z7982 Long term (current) use of aspirin: Secondary | ICD-10-CM | POA: Diagnosis not present

## 2013-01-03 DIAGNOSIS — E119 Type 2 diabetes mellitus without complications: Secondary | ICD-10-CM | POA: Insufficient documentation

## 2013-01-03 DIAGNOSIS — R51 Headache: Secondary | ICD-10-CM | POA: Diagnosis not present

## 2013-01-03 DIAGNOSIS — R42 Dizziness and giddiness: Secondary | ICD-10-CM | POA: Diagnosis not present

## 2013-01-03 LAB — CBC
MCV: 83.6 fL (ref 78.0–100.0)
Platelets: 253 10*3/uL (ref 150–400)
RBC: 4.4 MIL/uL (ref 3.87–5.11)
WBC: 5.7 10*3/uL (ref 4.0–10.5)

## 2013-01-03 LAB — COMPREHENSIVE METABOLIC PANEL
ALT: 18 U/L (ref 0–35)
AST: 19 U/L (ref 0–37)
Alkaline Phosphatase: 53 U/L (ref 39–117)
CO2: 29 mEq/L (ref 19–32)
Chloride: 102 mEq/L (ref 96–112)
Creatinine, Ser: 0.65 mg/dL (ref 0.50–1.10)
GFR calc non Af Amer: >90 mL/min (ref >90)
Potassium: 3.6 mEq/L (ref 3.5–5.1)
Sodium: 138 mEq/L (ref 135–145)
Total Bilirubin: 0.2 mg/dL — ABNORMAL LOW (ref 0.3–1.2)

## 2013-01-03 LAB — URINALYSIS, ROUTINE W REFLEX MICROSCOPIC
Bilirubin Urine: NEGATIVE
Hgb urine dipstick: NEGATIVE
Ketones, ur: NEGATIVE mg/dL
Protein, ur: NEGATIVE mg/dL
Urobilinogen, UA: 0.2 mg/dL (ref 0.0–1.0)

## 2013-01-03 NOTE — ED Provider Notes (Signed)
History    CSN: [id number] Arrival date & time 01/03/13  1529  First MD Initiated Contact with Patient 01/03/13 1555     Chief Complaint  Patient presents with  . Hypertension   (Consider location/radiation/quality/duration/timing/severity/associated sxs/prior Treatment) HPI  [name] is a 47 y.o. female  with a hx of diabetes, hypertension, asthma presents to the Emergency Department complaining of gradual, persistent, progressively worsening dyspnea on exertion with associated peripheral edema and increasingly high blood pressure for one month. Today with acute onset of blurry vision at 1 PM.  Patient just completed a 24-hour urine test which she took her primary care provider.  She stopped by the cardiology office with concerns of her hypertension her cardiologist was not in any RN suggested evaluation by the emergency department.  Patient states she has a history of intermittent blurred vision and the ophthalmologist thinks she may be developing glaucoma. Patient also endorses dyspnea on exertion particularly with stairs but she has it should be noted this to her asthma. Patient also states she is taking HCTZ for the swelling in her legs but using it every day increases the swelling and the swelling has been more apparent in the last month.  He states in the last month her systolic blood pressures have ranged from 180-210 and diastolic blood pressures have been in the low 100s. Patient states she's had a history of hypertension since she was age 17 and has been on consistent medications for the last 9 years.  Patient cardiology with [facility].  Patient also endorses an episode of lightheadedness this afternoon about 3 PM. She denies fever, chills, neck pain, chest pain, abdominal pain, nausea, vomiting, diarrhea, weakness, dizziness, dysuria, hematuria.      Past Medical History  Diagnosis Date  . Hypertension   . Diabetes mellitus without complication   . Asthma     Past Surgical History  Procedure Laterality Date  . Shoulder surgery    . Carpal tunnel release      right  . Back surgery      l4-5  . Spinal cord stimulator implant    . Abdominal hysterectomy     No family history on file. History  Substance Use Topics  . Smoking status: Former Games developer  . Smokeless tobacco: Never Used  . Alcohol Use: Yes     Comment: socially    OB History   Grav Para Term Preterm Abortions TAB SAB Ect Mult Living                 Review of Systems  Constitutional: Negative for fever, diaphoresis, appetite change, fatigue and unexpected weight change.  HENT: Negative for mouth sores and neck stiffness.   Eyes: Positive for visual disturbance.  Respiratory: Positive for shortness of breath. Negative for cough, chest tightness and wheezing.   Cardiovascular: Positive for leg swelling. Negative for chest pain and palpitations.  Gastrointestinal: Negative for nausea, vomiting, abdominal pain, diarrhea and constipation.  Endocrine: Negative for polydipsia, polyphagia and polyuria.  Genitourinary: Negative for dysuria, urgency, frequency and hematuria.  Musculoskeletal: Negative for back pain.  Skin: Negative for rash.  Allergic/Immunologic: Negative for immunocompromised state.  Neurological: Positive for light-headedness. Negative for syncope and headaches.  Hematological: Does not bruise/bleed easily.  Psychiatric/Behavioral: Negative for sleep disturbance. The patient is not nervous/anxious.     Allergies  Review of patient's allergies indicates no known allergies.  Home Medications   Current Outpatient Rx  Name  Route  Sig  Dispense  Refill  . amLODipine (NORVASC) 5 MG tablet   Oral   Take 5 mg by mouth every morning.         Marland Kitchen aspirin 81 MG tablet   Oral   Take 81 mg by mouth every morning.          . Aspirin-Acetaminophen-Caffeine (GOODY HEADACHE PO)   Oral   Take 1 Package by mouth as needed (headache).         . Calcium  Carbonate-Vitamin D (CALCIUM 600 + D PO)   Oral   Take 1 tablet by mouth every morning.          . carboxymethylcellulose (REFRESH PLUS) 0.5 % SOLN   Both Eyes   Place 1 drop into both eyes 3 (three) times daily as needed (dry eyes).         Marland Kitchen doxazosin (CARDURA) 2 MG tablet   Oral   Take 2 mg by mouth every evening.          . Eszopiclone 3 MG TABS   Oral   Take 3 mg by mouth at bedtime as needed. Take immediately before bedtime         . gabapentin (NEURONTIN) 800 MG tablet   Oral   Take 800 mg by mouth 4 (four) times daily as needed (pain).         Marland Kitchen glimepiride (AMARYL) 2 MG tablet   Oral   Take 2 mg by mouth daily before breakfast.         . hydrochlorothiazide (HYDRODIURIL) 25 MG tablet   Oral   Take 25 mg by mouth every other day.         . metFORMIN (GLUCOPHAGE-XR) 500 MG 24 hr tablet   Oral   Take 2,000 mg by mouth daily with supper.         . metoprolol (TOPROL-XL) 200 MG 24 hr tablet   Oral   Take 200 mg by mouth every evening.          . nystatin-triamcinolone ointment (MYCOLOG)   Topical   Apply 1 application topically 2 (two) times daily.         Marland Kitchen olmesartan (BENICAR) 40 MG tablet   Oral   Take 40 mg by mouth every evening.           BP 141/69  Pulse 54  Temp(Src) 98.4 F (36.9 C) (Oral)  Resp 16  Ht 5\' 6"  (1.676 m)  Wt 260 lb (117.935 kg)  BMI 41.99 kg/m2  SpO2 95% Physical Exam  Nursing note and vitals reviewed. Constitutional: She is oriented to person, place, and time. She appears well-developed and well-nourished. No distress.  Awake, alert, nontoxic appearance  HENT:  Head: Normocephalic and atraumatic.  Mouth/Throat: Oropharynx is clear and moist. No oropharyngeal exudate.  Eyes: Conjunctivae and EOM are normal. Pupils are equal, round, and reactive to light. Right eye exhibits no chemosis. Left eye exhibits no chemosis. Right conjunctiva is not injected. Left conjunctiva is not injected. No scleral icterus.   Fundoscopic exam:      The right eye shows no hemorrhage and no papilledema.       The left eye shows no hemorrhage and no papilledema.  Visual Acuity:  Bilateral Distance:  20/20  R Distance:  20/30  L Distance:  20/25  Neck: Normal range of motion. Neck supple. No thyromegaly present.  Cardiovascular: Normal rate, regular rhythm, normal heart sounds and intact distal pulses.   No murmur heard. Capillary refill  less than 3 sec Nonpitting edema of the lower extremities extending from the mid forefoot to just above the ankle without inclusion of the calf, present bilaterally  Pulmonary/Chest: Effort normal and breath sounds normal. No respiratory distress. She has no wheezes.  Abdominal: Soft. Bowel sounds are normal. She exhibits no distension and no mass. There is no tenderness. There is no rebound and no guarding.  Musculoskeletal: Normal range of motion. She exhibits no edema.  Negative Homans sign No tenderness to palpation of the calf No palpable cord  Lymphadenopathy:    She has no cervical adenopathy.  Neurological: She is alert and oriented to person, place, and time. She has normal reflexes. She exhibits normal muscle tone. Coordination normal.  Speech is clear and goal oriented, follows commands Major Cranial nerves without deficit, no facial droop Normal strength in upper and lower extremities bilaterally including dorsiflexion and plantar flexion, strong and equal grip strength Sensation normal to light and sharp touch Moves extremities without ataxia, coordination intact Normal finger to nose and rapid alternating movements Neg romberg, no pronator drift Normal gait and balance  Skin: Skin is warm and dry. No rash noted. She is not diaphoretic. No erythema.  Psychiatric: She has a normal mood and affect.    ED Course  Procedures (including critical care time) Labs Reviewed  COMPREHENSIVE METABOLIC PANEL - Abnormal; Notable for the following:    Total Bilirubin 0.2  (*)    All other components within normal limits  CBC  URINALYSIS, ROUTINE W REFLEX MICROSCOPIC  POCT I-STAT TROPONIN I   Dg Chest 2 View  01/03/2013   *RADIOLOGY REPORT*  Clinical Data: Shortness of breath and headache.  CHEST - 2 VIEW  Comparison: Chest x-ray 11/21/2010.  Findings: Lung volumes are normal.  No acute consolidative airspace disease.  No pleural effusions.  Mild cephalization of the pulmonary vasculature, without frank pulmonary edema.  Heart size is mildly enlarged (unchanged).  Upper mediastinal contours are unremarkable.  Atherosclerosis in the thoracic aorta.  Spinal cord stimulator projecting over the mid to lower thoracic spine.  IMPRESSION: 1.  Mild cardiomegaly with pulmonary venous congestion, but no frank pulmonary edema.   Original Report Authenticated By: Trudie Reed, M.D.   Ct Head Wo Contrast  01/03/2013   *RADIOLOGY REPORT*  Clinical Data: Hypertension.  Headache and blurred vision.  CT HEAD WITHOUT CONTRAST  Technique:  Contiguous axial images were obtained from the base of the skull through the vertex without contrast.  Comparison: 06/05/2006 MRI.  Findings: Bone windows demonstrate minimal fluid within the left sphenoid sinus on image 7/series 3.  Partially aerated petrous apices. Clear mastoid air cells.  Soft tissue windows demonstrate no  mass lesion, hemorrhage, hydrocephalus, acute infarct, intra-axial, or extra-axial fluid collection.  IMPRESSION:  1. No acute intracranial abnormality. 2.  Sinus disease.   Original Report Authenticated By: Jeronimo Greaves, M.D.   ECG:  Date: 01/03/2013  Rate: 53  Rhythm: sinus bradycardia and sinus arrhythmia  QRS Axis: normal  Intervals: normal  ST/T Wave abnormalities: normal  Conduction Disutrbances:none  Narrative Interpretation: nonischemic ECG, no old for comparison  Old EKG Reviewed: none available    1. HTN (hypertension)   2. Vision changes     MDM  [name] presents with HTN and blurred  vision, no neurologic deficit on exam.  The two are likely uncorrelated as the HTN has been present for 1 month and her blurry vision episodes have been intermittent for much longer.  Will evaluate  further.  Pt also with dyspnea on exertion for the last month; will evaluate cardiac causes.  NO rales or rhonchi on exam; doubt pulmonary edema.     6:24 PM Pt without significant visual disturbance; no papilledema on fundoscopic exam.  Head CT is negative for acute intracranial abnormality.  I personally reviewed the imaging tests through PACS system.  I reviewed available ER/hospitalization records through the EMR.     7:20 PM Patient with resolution of visual changes.  CMP unremarkable, with normal kidney function. Patient states she feels better. Discussed need to continue with further evaluation by her primary care and cardiologist.  Also recommended that she continue to take her hypertension medications.  I explained the diagnosis and have given explicit precautions to return to the ER including for any other new or worsening symptoms. The patient understands and accepts the medical plan as it's been discussed and I have answered the patient questions. Discharge instructions concerning home care. The patient is STABLE and is discharged to home in good condition.  Dr. Jeraldine Loots  was consulted, evaluated this patient with me and agrees with the plan.        Dierdre Forth, PA-C 01/03/13 1922  Grace Henrickson, PA-C 01/04/13 [phone]

## 2013-01-03 NOTE — Progress Notes (Signed)
Patient confirms her pcp is Dr. Dallie Dad of [facility].

## 2013-01-03 NOTE — ED Notes (Signed)
Pt presents with c/o high blood pressure. Pt went to [facility] today and had her blood pressure checked, high at the office. Pt was instructed to come to the ER. Pt admits to headache and some blurred vision as well. Hx of HTN, does take medication daily for this.

## 2013-01-04 NOTE — ED Provider Notes (Signed)
  This was a shared visit with a mid-level provided (NP or PA).  Throughout the patient's course I was available for consultation/collaboration.  I saw the ECG (if appropriate), relevant labs and studies - I agree with the interpretation.  On my exam the patient was in no distress.      Gerhard Munch, MD 01/04/13 [phone]

## 2013-01-05 DIAGNOSIS — I10 Essential (primary) hypertension: Secondary | ICD-10-CM | POA: Diagnosis not present

## 2013-01-05 DIAGNOSIS — E119 Type 2 diabetes mellitus without complications: Secondary | ICD-10-CM | POA: Diagnosis not present

## 2013-01-13 DIAGNOSIS — I10 Essential (primary) hypertension: Secondary | ICD-10-CM | POA: Diagnosis not present

## 2013-01-19 DIAGNOSIS — I517 Cardiomegaly: Secondary | ICD-10-CM | POA: Diagnosis not present

## 2013-01-19 DIAGNOSIS — I10 Essential (primary) hypertension: Secondary | ICD-10-CM | POA: Diagnosis not present

## 2013-01-23 DIAGNOSIS — I517 Cardiomegaly: Secondary | ICD-10-CM | POA: Diagnosis not present

## 2013-01-23 DIAGNOSIS — I10 Essential (primary) hypertension: Secondary | ICD-10-CM | POA: Diagnosis not present

## 2013-02-28 DIAGNOSIS — I10 Essential (primary) hypertension: Secondary | ICD-10-CM | POA: Diagnosis not present

## 2013-03-06 DIAGNOSIS — I10 Essential (primary) hypertension: Secondary | ICD-10-CM | POA: Diagnosis not present

## 2013-03-07 DIAGNOSIS — I10 Essential (primary) hypertension: Secondary | ICD-10-CM | POA: Diagnosis not present

## 2013-03-22 DIAGNOSIS — I10 Essential (primary) hypertension: Secondary | ICD-10-CM | POA: Diagnosis not present

## 2013-03-22 DIAGNOSIS — Z23 Encounter for immunization: Secondary | ICD-10-CM | POA: Diagnosis not present

## 2013-03-22 DIAGNOSIS — S8990XA Unspecified injury of unspecified lower leg, initial encounter: Secondary | ICD-10-CM | POA: Diagnosis not present

## 2013-03-23 ENCOUNTER — Other Ambulatory Visit: Payer: Self-pay | Admitting: Family Medicine

## 2013-03-23 ENCOUNTER — Ambulatory Visit
Admission: RE | Admit: 2013-03-23 | Discharge: 2013-03-23 | Disposition: A | Discharge status: Home / Self Care | Payer: BC Managed Care – PPO | Source: Ambulatory Visit | Attending: Family Medicine | Admitting: Family Medicine

## 2013-03-23 DIAGNOSIS — S99922A Unspecified injury of left foot, initial encounter: Secondary | ICD-10-CM

## 2013-04-13 DIAGNOSIS — N951 Menopausal and female climacteric states: Secondary | ICD-10-CM | POA: Diagnosis not present

## 2013-04-13 DIAGNOSIS — Z01419 Encounter for gynecological examination (general) (routine) without abnormal findings: Secondary | ICD-10-CM | POA: Diagnosis not present

## 2013-04-13 DIAGNOSIS — N760 Acute vaginitis: Secondary | ICD-10-CM | POA: Diagnosis not present

## 2013-05-06 ENCOUNTER — Encounter: Payer: Self-pay | Admitting: Cardiology

## 2013-05-06 DIAGNOSIS — I10 Essential (primary) hypertension: Secondary | ICD-10-CM | POA: Insufficient documentation

## 2013-05-06 DIAGNOSIS — J45909 Unspecified asthma, uncomplicated: Secondary | ICD-10-CM | POA: Insufficient documentation

## 2013-05-06 DIAGNOSIS — E119 Type 2 diabetes mellitus without complications: Secondary | ICD-10-CM | POA: Insufficient documentation

## 2013-05-09 ENCOUNTER — Ambulatory Visit: Payer: Medicare Other | Admitting: Cardiology

## 2013-05-15 ENCOUNTER — Encounter: Payer: Self-pay | Admitting: General Surgery

## 2013-05-15 ENCOUNTER — Ambulatory Visit (INDEPENDENT_AMBULATORY_CARE_PROVIDER_SITE_OTHER): Payer: Medicare Other | Admitting: Cardiology

## 2013-05-15 ENCOUNTER — Encounter: Payer: Self-pay | Admitting: Cardiology

## 2013-05-15 VITALS — BP 140/87 | HR 72 | Ht 66.0 in | Wt 262.0 lb

## 2013-05-15 DIAGNOSIS — I10 Essential (primary) hypertension: Secondary | ICD-10-CM

## 2013-05-15 DIAGNOSIS — G4733 Obstructive sleep apnea (adult) (pediatric): Secondary | ICD-10-CM | POA: Diagnosis not present

## 2013-05-15 DIAGNOSIS — R0602 Shortness of breath: Secondary | ICD-10-CM

## 2013-05-15 DIAGNOSIS — Z6841 Body Mass Index (BMI) 40.0 and over, adult: Secondary | ICD-10-CM

## 2013-05-15 DIAGNOSIS — K760 Fatty (change of) liver, not elsewhere classified: Secondary | ICD-10-CM | POA: Insufficient documentation

## 2013-05-15 LAB — BASIC METABOLIC PANEL
Calcium: 9.2 mg/dL (ref 8.4–10.5)
GFR: 132.63 mL/min (ref >60.00)
Glucose, Bld: 76 mg/dL (ref 70–99)
Sodium: 139 mEq/L (ref 135–145)

## 2013-05-15 NOTE — Patient Instructions (Signed)
Your physician recommends that you continue on your current medications as directed. Please refer to the Current Medication list given to you today.  Your physician has requested that you have an exercise tolerance test. For further information please visit [email or web address] Please also follow instruction sheet, as given.  Your physician recommends that you go to the lab today for a BMET  We will contact your DME to get a download for your CPAP machine  Your physician wants you to follow-up in: 6 Months with Dr. Sherlyn Lick will receive a reminder letter in the mail two months in advance. If you don't receive a letter, please call our office to schedule the follow-up appointment.

## 2013-05-15 NOTE — Progress Notes (Signed)
[address] [place], [place]  [address] Phone: [phone] Fax:  [phone]  Date:  05/15/2013   ID:  [name], DOB 03/09/1966, MRN [id number]  PCP:  Frederich Chick, MD  Sleep Medicine:  Armanda Magic, MD   History of Present Illness: [name] is a 47 y.o. female with a history of morbid obesity, HTN and OSA.  She is doing fairly well today.  She says that her main complaint is that of severe exertional fatigue and DOE when walking up the stairs.  This only occurs going up stairs and not when doing any other exercise.  She has chronic LE edema which is stable.  He denies any dizziness, palpitations or syncope.  She denies any chest pain.  SHe tolerates her CPAP therapy well.  She uses a full face mask which she tolerates well with no leakage and feels the pressure is adequate.  She does not feel rested when she gets up in the am.  She goes to bed at 11-11:30PM and gets up at 6:30-7am.  She gets up once nightly to go to the bathroom.  She still has daytime sleepiness.  She walks every other day for about 30-40 minutes.     Wt Readings from Last 3 Encounters:  05/15/13 262 lb (118.842 kg)  01/03/13 260 lb (117.935 kg)     Past Medical History  Diagnosis Date  . Hypertension   . Diabetes mellitus without complication   . Asthma   . Fatty liver   . Morbid obesity with BMI of 40.0-44.9, adult   . OSA (obstructive sleep apnea)     mild with AHI 11/hr now on CPAP at 20cm H2O    Current Outpatient Prescriptions  Medication Sig Dispense Refill  . amLODipine (NORVASC) 5 MG tablet Take 5 mg by mouth every morning.      Marland Kitchen aspirin 81 MG tablet Take 81 mg by mouth every morning.       . Aspirin-Acetaminophen-Caffeine (GOODY HEADACHE PO) Take 1 Package by mouth as needed (headache).      . Calcium Carbonate-Vitamin D (CALCIUM 600 + D PO) Take 1 tablet by mouth every morning.       . carboxymethylcellulose (REFRESH PLUS) 0.5 % SOLN Place 1 drop  into both eyes 3 (three) times daily as needed (dry eyes).      Marland Kitchen doxazosin (CARDURA) 2 MG tablet Take 2 mg by mouth every evening.       . Eszopiclone 3 MG TABS Take 3 mg by mouth at bedtime as needed. Take immediately before bedtime      . gabapentin (NEURONTIN) 800 MG tablet Take 800 mg by mouth 4 (four) times daily as needed (pain).      Marland Kitchen glimepiride (AMARYL) 2 MG tablet Take 2 mg by mouth daily before breakfast.      . hydrochlorothiazide (HYDRODIURIL) 25 MG tablet Take 25 mg by mouth every other day.      . metFORMIN (GLUCOPHAGE-XR) 500 MG 24 hr tablet Take 2,000 mg by mouth daily with supper.      . metoprolol (TOPROL-XL) 200 MG 24 hr tablet Take 200 mg by mouth every evening.       . olmesartan (BENICAR) 40 MG tablet Take 40 mg by mouth every evening.        No current facility-administered medications for this visit.    Allergies:   No Known Allergies  Social History:  The patient  reports  that she has quit smoking. She has never used smokeless tobacco. She reports that she drinks alcohol. She reports that she does not use illicit drugs.   Family History:  The patient's family history includes Breast cancer in her mother; Diabetes in her father and mother; Hypertension in her father and mother; Lymphoma in her mother.   ROS:  Please see the history of present illness.      All other systems reviewed and negative.   PHYSICAL EXAM: VS:  BP 140/87  Pulse 72  Ht 5\' 6"  (1.676 m)  Wt 262 lb (118.842 kg)  BMI 42.31 kg/m2 Well nourished, well developed, in no acute distress HEENT: normal Neck: no JVD Cardiac:  normal S1, S2; RRR; no murmur Lungs:  clear to auscultation bilaterally, no wheezing, rhonchi or rales Abd: soft, nontender, no hepatomegaly Ext: no edema Skin: warm and dry Neuro:  CNs 2-12 intact, no focal abnormalities noted       ASSESSMENT AND PLAN:  1. OSA on CPAP therapy but having problems with daytime sleepiness and nonrestorative sleep  - I will get a  download from her DME 2. SOB with recent normal echo with EF 56%.  I suspect her SOB is due to deconditioning and [place] lifestyle as well as obesity.    - I will get an ETT to rule out ischemia 3.  Morbid obesity   - I have encouraged her to try to increase her exercise regimen to walk daily 4.  HTN - controlled  - continue metoprolol/HCTZ/doxazosin/amlodipine  - check BMET  Followup with me in 6 months Signed, Armanda Magic, MD 05/15/2013 1:35 PM

## 2013-05-19 ENCOUNTER — Telehealth: Payer: Self-pay | Admitting: *Deleted

## 2013-05-19 DIAGNOSIS — E876 Hypokalemia: Secondary | ICD-10-CM

## 2013-05-19 MED ORDER — POTASSIUM CHLORIDE CRYS ER 20 MEQ PO TBCR: 20.0000 meq | EXTENDED_RELEASE_TABLET | Freq: Every day | ORAL | Status: DC

## 2013-05-19 NOTE — Telephone Encounter (Signed)
Patient called back regarding message left on answering machine about lab results.  Patient's K+ = 3.4 and Dr. Mayford Knife requesting patient to begin KDur by mouth daily and repeat BMET in one week. Provided education to patient regarding potassium and sodium levels, purpose and actions of potassium/sodium in the body, interactions of potassium/sodium, possible effects of low/high potassium levels, potassium rich foods, purpose/need for BMET to monitor levels especially after potassium supplementation. Patient advised to call office for any further questions, concerns or cardiac symptoms. Patient verbalized understanding and appreciation of education.

## 2013-05-19 NOTE — Telephone Encounter (Signed)
Please see results notes from [facility] 05/15/13.

## 2013-05-19 NOTE — Progress Notes (Signed)
Quick Note:  Patient called back regarding message left on answering machine about lab results. Patient's K+ = 3.4 and Dr. Mayford Knife requesting patient to begin KDur by mouth daily and repeat BMET in one week. Provided education to patient regarding potassium and sodium levels, purpose and actions of potassium/sodium in the body, interactions of potassium/sodium, possible effects of low/high potassium levels, potassium rich foods, purpose/need for BMET to monitor levels especially after potassium supplementation. Patient advised to call office for any further questions, concerns or cardiac symptoms. Patient verbalized understanding and appreciation of education. ______

## 2013-06-23 ENCOUNTER — Encounter: Payer: BC Managed Care – PPO | Admitting: Nurse Practitioner

## 2013-06-30 ENCOUNTER — Other Ambulatory Visit: Payer: BC Managed Care – PPO

## 2013-07-26 ENCOUNTER — Encounter: Payer: Self-pay | Admitting: Cardiology

## 2013-07-28 ENCOUNTER — Ambulatory Visit (INDEPENDENT_AMBULATORY_CARE_PROVIDER_SITE_OTHER): Payer: BC Managed Care – PPO | Admitting: Physician Assistant

## 2013-07-28 ENCOUNTER — Encounter (INDEPENDENT_AMBULATORY_CARE_PROVIDER_SITE_OTHER): Payer: Self-pay

## 2013-07-28 DIAGNOSIS — R0602 Shortness of breath: Secondary | ICD-10-CM

## 2013-07-28 NOTE — Progress Notes (Signed)
Exercise Treadmill Test  Pre-Exercise Testing Evaluation Rhythm: normal sinus  Rate: 80 bpm     Test  Exercise Tolerance Test Ordering MD: Fransico Him, MD  Interpreting MD: Richardson Dopp, PA-C  Unique Test No: 1  Treadmill:  1  Indication for ETT: exertional dyspnea  Contraindication to ETT: No   Stress Modality: exercise - treadmill  Cardiac Imaging Performed: non   Protocol: standard Bruce - maximal  Max BP:  217/83  Max MPHR (bpm):  173 85% MPR (bpm):  147  MPHR obtained (bpm):  153 % MPHR obtained:  88  Reached 85% MPHR (min:sec):  7:58 Total Exercise Time (min-sec):  9:00  Workload in METS:  10.1 Borg Scale: 19  Reason ETT Terminated:  patient's desire to stop    ST Segment Analysis At Rest: normal ST segments - no evidence of significant ST depression With Exercise: non-specific ST changes  Other Information Arrhythmia:  No Angina during ETT:  absent (0) Quality of ETT:  diagnostic  ETT Interpretation:  normal - no evidence of ischemia by ST analysis  Comments: Good exercise capacity. No chest pain. Normal BP response to exercise. No ST changes to suggest ischemia.   Recommendations: F/u with Dr. Fransico Him as directed. Signed,  Richardson Dopp, PA-C   07/28/2013 11:49 AM

## 2013-09-29 DIAGNOSIS — I10 Essential (primary) hypertension: Secondary | ICD-10-CM | POA: Diagnosis not present

## 2013-09-29 DIAGNOSIS — Z6841 Body Mass Index (BMI) 40.0 and over, adult: Secondary | ICD-10-CM | POA: Diagnosis not present

## 2013-09-29 DIAGNOSIS — E669 Obesity, unspecified: Secondary | ICD-10-CM | POA: Diagnosis not present

## 2013-09-29 DIAGNOSIS — F3289 Other specified depressive episodes: Secondary | ICD-10-CM | POA: Diagnosis not present

## 2013-09-29 DIAGNOSIS — F329 Major depressive disorder, single episode, unspecified: Secondary | ICD-10-CM | POA: Diagnosis not present

## 2013-11-14 DIAGNOSIS — M67919 Unspecified disorder of synovium and tendon, unspecified shoulder: Secondary | ICD-10-CM | POA: Diagnosis not present

## 2013-11-14 DIAGNOSIS — M719 Bursopathy, unspecified: Secondary | ICD-10-CM | POA: Diagnosis not present

## 2013-11-14 DIAGNOSIS — Z5189 Encounter for other specified aftercare: Secondary | ICD-10-CM | POA: Diagnosis not present

## 2013-11-21 ENCOUNTER — Other Ambulatory Visit: Payer: Self-pay | Admitting: Orthopedic Surgery

## 2013-11-21 DIAGNOSIS — M719 Bursopathy, unspecified: Principal | ICD-10-CM

## 2013-11-21 DIAGNOSIS — M67919 Unspecified disorder of synovium and tendon, unspecified shoulder: Secondary | ICD-10-CM

## 2013-11-24 ENCOUNTER — Other Ambulatory Visit: Payer: BC Managed Care – PPO

## 2013-11-29 ENCOUNTER — Ambulatory Visit
Admission: RE | Admit: 2013-11-29 | Discharge: 2013-11-29 | Disposition: A | Discharge status: Home / Self Care | Payer: BC Managed Care – PPO | Source: Ambulatory Visit | Attending: Orthopedic Surgery | Admitting: Orthopedic Surgery

## 2013-11-29 DIAGNOSIS — M719 Bursopathy, unspecified: Principal | ICD-10-CM

## 2013-11-29 DIAGNOSIS — M25519 Pain in unspecified shoulder: Secondary | ICD-10-CM | POA: Diagnosis not present

## 2013-11-29 DIAGNOSIS — M67919 Unspecified disorder of synovium and tendon, unspecified shoulder: Secondary | ICD-10-CM

## 2013-11-29 MED ORDER — IOHEXOL 300 MG/ML  SOLN
20.0000 mL | Freq: Once | INTRAMUSCULAR | Status: AC | PRN
  Administered 2013-11-29: 20 mL via INTRA_ARTICULAR

## 2014-02-21 DIAGNOSIS — J029 Acute pharyngitis, unspecified: Secondary | ICD-10-CM | POA: Diagnosis not present

## 2014-02-22 DIAGNOSIS — J029 Acute pharyngitis, unspecified: Secondary | ICD-10-CM | POA: Diagnosis not present

## 2014-03-29 DIAGNOSIS — M5136 Other intervertebral disc degeneration, lumbar region: Secondary | ICD-10-CM | POA: Diagnosis not present

## 2014-03-29 DIAGNOSIS — M5442 Lumbago with sciatica, left side: Secondary | ICD-10-CM | POA: Diagnosis not present

## 2014-04-09 ENCOUNTER — Other Ambulatory Visit: Payer: Self-pay | Admitting: Orthopedic Surgery

## 2014-04-09 DIAGNOSIS — M5442 Lumbago with sciatica, left side: Secondary | ICD-10-CM

## 2014-04-20 ENCOUNTER — Other Ambulatory Visit: Payer: Self-pay

## 2014-04-20 DIAGNOSIS — Z1231 Encounter for screening mammogram for malignant neoplasm of breast: Secondary | ICD-10-CM

## 2014-04-25 ENCOUNTER — Ambulatory Visit (INDEPENDENT_AMBULATORY_CARE_PROVIDER_SITE_OTHER): Payer: BC Managed Care – PPO | Admitting: Cardiology

## 2014-04-25 ENCOUNTER — Encounter: Payer: Self-pay | Admitting: Cardiology

## 2014-04-25 VITALS — BP 142/88 | HR 67 | Ht 66.0 in | Wt 263.0 lb

## 2014-04-25 DIAGNOSIS — I10 Essential (primary) hypertension: Secondary | ICD-10-CM

## 2014-04-25 DIAGNOSIS — G4733 Obstructive sleep apnea (adult) (pediatric): Secondary | ICD-10-CM

## 2014-04-25 DIAGNOSIS — Z6841 Body Mass Index (BMI) 40.0 and over, adult: Secondary | ICD-10-CM | POA: Diagnosis not present

## 2014-04-25 NOTE — Patient Instructions (Signed)
Your physician recommends that you continue on your current medications as directed. Please refer to the Current Medication list given to you today.  Your physician wants you to follow-up in: 6 months with Dr Turner You will receive a reminder letter in the mail two months in advance. If you don't receive a letter, please call our office to schedule the follow-up appointment.  

## 2014-04-25 NOTE — Progress Notes (Signed)
[address] [place], [place]  [address] Phone: [phone] Fax:  [phone]  Date:  04/25/2014   ID:  [name], DOB Aug 06, 1965, MRN [id number]  PCP:  Jonathon Bellows, MD  Cardiologist:  Fransico Him, MD    History of Present Illness: [name] is a 48 y.o. female with a history of morbid obesity, HTN and OSA. She is doing fairly well today. When I last saw her she was complaining of severe exertional fatigue and DOE when walking up the stairs. echo showed normal LVF and ETT showed no ischemia.   Her SOB has resolved.  She has chronic LE edema which is stable on diuretics.  She denies any chest pain. She tolerates her CPAP therapy well. She uses a full face mask which she tolerates well with no leakage and feels the pressure is adequate. She does feel rested when she gets up in the am. She still has daytime sleepiness but she only gets 5 hours of sleep nightly. She walks every other day for about 30-40 minutes.    Wt Readings from Last 3 Encounters:  04/25/14 263 lb (119.296 kg)  05/15/13 262 lb (118.842 kg)  01/03/13 260 lb (117.935 kg)     Past Medical History  Diagnosis Date  . Hypertension   . Diabetes mellitus without complication   . Asthma   . Fatty liver   . Morbid obesity with BMI of 40.0-44.9, adult   . OSA (obstructive sleep apnea)     mild with AHI 11/hr now on CPAP at 20cm H2O    Current Outpatient Prescriptions  Medication Sig Dispense Refill  . amLODipine (NORVASC) 5 MG tablet Take 5 mg by mouth every morning.    Marland Kitchen aspirin 81 MG tablet Take 81 mg by mouth every morning.     . Aspirin-Acetaminophen-Caffeine (GOODY HEADACHE PO) Take 1 Package by mouth as needed (headache).    Marland Kitchen buPROPion (WELLBUTRIN) 100 MG tablet Take 100 mg by mouth 2 (two) times daily.    . Calcium Carbonate-Vitamin D (CALCIUM 600 + D PO) Take 1 tablet by mouth every morning.     . carboxymethylcellulose (REFRESH PLUS) 0.5 % SOLN Place 1  drop into both eyes 3 (three) times daily as needed (dry eyes).    . Eszopiclone 3 MG TABS Take 3 mg by mouth at bedtime as needed. Take immediately before bedtime    . glimepiride (AMARYL) 2 MG tablet Take 2 mg by mouth daily before breakfast.    . hydrochlorothiazide (HYDRODIURIL) 25 MG tablet Take 25 mg by mouth every other day.    . metFORMIN (GLUCOPHAGE-XR) 500 MG 24 hr tablet Take 2,000 mg by mouth daily with supper.    . metoprolol (TOPROL-XL) 200 MG 24 hr tablet Take 200 mg by mouth every evening.     . potassium chloride SA (K-DUR,KLOR-CON) 20 MEQ tablet Take 1 tablet (20 mEq total) by mouth daily. 90 tablet 3  . doxazosin (CARDURA) 2 MG tablet Take 2 mg by mouth every evening.     . gabapentin (NEURONTIN) 800 MG tablet Take 800 mg by mouth 4 (four) times daily as needed (pain).     No current facility-administered medications for this visit.    Allergies:   No Known Allergies  Social History:  The patient  reports that she has quit smoking. She has never used smokeless tobacco. She reports that she drinks alcohol. She reports that she does not use illicit drugs.  Family History:  The patient's family history includes Breast cancer in her mother; Diabetes in her father and mother; Hypertension in her father and mother; Lymphoma in her mother.   ROS:  Please see the history of present illness.      All other systems reviewed and negative.   PHYSICAL EXAM: VS:  BP 142/88 mmHg  Pulse 67  Ht 5\' 6"  (1.676 m)  Wt 263 lb (119.296 kg)  BMI 42.47 kg/m2 Well nourished, well developed, in no acute distress HEENT: normal Neck: no JVD Cardiac:  normal S1, S2; RRR; no murmur Lungs:  clear to auscultation bilaterally, no wheezing, rhonchi or rales Abd: soft, nontender, no hepatomegaly Ext: trace edema Skin: warm and dry Neuro:  CNs 2-12 intact, no focal abnormalities noted   ASSESSMENT AND PLAN:  1. OSA on CPAP therapy - I will get a download from her DME   2.  Morbid obesity  - I have encouraged her to try to increase her exercise regimen to walk daily   3. HTN - controlled - continue metoprolol/HCTZ/doxazosin/amlodipine - check BMET   4.  Chronic LE edema on HCTZ  Followup with me in 6 months    Signed, Fransico Him, MD [facility] 04/25/2014 11:43 AM

## 2014-05-01 ENCOUNTER — Ambulatory Visit
Admission: RE | Admit: 2014-05-01 | Discharge: 2014-05-01 | Disposition: A | Discharge status: Home / Self Care | Payer: BC Managed Care – PPO | Source: Ambulatory Visit | Attending: Orthopedic Surgery | Admitting: Orthopedic Surgery

## 2014-05-01 DIAGNOSIS — M5442 Lumbago with sciatica, left side: Secondary | ICD-10-CM

## 2014-05-01 MED ORDER — ONDANSETRON HCL 4 MG/2ML IJ SOLN
4.0000 mg | Freq: Once | INTRAMUSCULAR | Status: AC
  Administered 2014-05-01: 4 mg via INTRAMUSCULAR

## 2014-05-01 MED ORDER — IOHEXOL 180 MG/ML  SOLN: 15.0000 mL | Freq: Once | INTRAMUSCULAR | Status: AC | PRN

## 2014-05-01 MED ORDER — DIAZEPAM 5 MG PO TABS
10.0000 mg | ORAL_TABLET | Freq: Once | ORAL | Status: AC
  Administered 2014-05-01: 10 mg via ORAL

## 2014-05-01 MED ORDER — MEPERIDINE HCL 100 MG/ML IJ SOLN
100.0000 mg | Freq: Once | INTRAMUSCULAR | Status: AC
  Administered 2014-05-01: 100 mg via INTRAMUSCULAR

## 2014-05-01 NOTE — Progress Notes (Signed)
Patient states she has been off Bupropion for at least the past two days.

## 2014-05-01 NOTE — Discharge Instructions (Addendum)
Myelogram Discharge Instructions  1. Go home and rest quietly for the next 24 hours.  It is important to lie flat for the next 24 hours.  Get up only to go to the restroom.  You may lie in the bed or on a couch on your back, your stomach, your left side or your right side.  You may have one pillow under your head.  You may have pillows between your knees while you are on your side or under your knees while you are on your back.  2. DO NOT drive today.  Recline the seat as far back as it will go, while still wearing your seat belt, on the way home.  3. You may get up to go to the bathroom as needed.  You may sit up for 10 minutes to eat.  You may resume your normal diet and medications unless otherwise indicated.  Drink lots of extra fluids today and tomorrow.  4. The incidence of headache, nausea, or vomiting is about 5% (one in 20 patients).  If you develop a headache, lie flat and drink plenty of fluids until the headache goes away.  Caffeinated beverages may be helpful.  If you develop severe nausea and vomiting or a headache that does not go away with flat bed rest, call [phone].  5. You may resume normal activities after your 24 hours of bed rest is over; however, do not exert yourself strongly or do any heavy lifting tomorrow. If when you get up you have a headache when standing, go back to bed and force fluids for another 24 hours.  6. Call your physician for a follow-up appointment.  The results of your myelogram will be sent directly to your physician by the following day.  7. If you have any questions or if complications develop after you arrive home, please call [phone].  Discharge instructions have been explained to the patient.  The patient, or the person responsible for the patient, fully understands these instructions.      May resume Bupropion on Nov. 18, 2015, after 11:00 am.

## 2014-05-04 DIAGNOSIS — T85118A Breakdown (mechanical) of other implanted electronic stimulator of nervous system, initial encounter: Secondary | ICD-10-CM | POA: Diagnosis not present

## 2014-05-09 ENCOUNTER — Ambulatory Visit
Admission: RE | Admit: 2014-05-09 | Discharge: 2014-05-09 | Disposition: A | Discharge status: Home / Self Care | Payer: BC Managed Care – PPO | Source: Ambulatory Visit

## 2014-05-09 DIAGNOSIS — Z1231 Encounter for screening mammogram for malignant neoplasm of breast: Secondary | ICD-10-CM

## 2014-05-21 ENCOUNTER — Other Ambulatory Visit (HOSPITAL_COMMUNITY): Payer: Self-pay | Admitting: Orthopedic Surgery

## 2014-05-21 DIAGNOSIS — Z4789 Encounter for other orthopedic aftercare: Secondary | ICD-10-CM

## 2014-05-25 ENCOUNTER — Ambulatory Visit (HOSPITAL_COMMUNITY)
Admission: RE | Admit: 2014-05-25 | Discharge: 2014-05-25 | Disposition: A | Discharge status: Home / Self Care | Payer: BC Managed Care – PPO | Source: Ambulatory Visit | Attending: Cardiology | Admitting: Cardiology

## 2014-05-25 ENCOUNTER — Other Ambulatory Visit (HOSPITAL_COMMUNITY): Payer: Self-pay | Admitting: Orthopedic Surgery

## 2014-05-25 DIAGNOSIS — R609 Edema, unspecified: Secondary | ICD-10-CM | POA: Diagnosis not present

## 2014-05-25 DIAGNOSIS — Z4789 Encounter for other orthopedic aftercare: Secondary | ICD-10-CM

## 2014-05-25 DIAGNOSIS — R200 Anesthesia of skin: Secondary | ICD-10-CM | POA: Insufficient documentation

## 2014-05-25 NOTE — Progress Notes (Signed)
Lower Extremity Arterial Duplex Completed. °Brianna L Mazza,RVT °

## 2014-06-01 ENCOUNTER — Telehealth (HOSPITAL_COMMUNITY): Payer: Self-pay | Admitting: *Deleted

## 2014-06-05 ENCOUNTER — Other Ambulatory Visit: Payer: Self-pay | Admitting: Cardiology

## 2014-06-05 ENCOUNTER — Emergency Department (INDEPENDENT_AMBULATORY_CARE_PROVIDER_SITE_OTHER)
Admission: EM | Admit: 2014-06-05 | Discharge: 2014-06-05 | Disposition: A | Discharge status: Home / Self Care | Payer: BC Managed Care – PPO | Source: Home / Self Care | Attending: Family Medicine | Admitting: Family Medicine

## 2014-06-05 ENCOUNTER — Encounter (HOSPITAL_COMMUNITY): Payer: Self-pay | Admitting: Emergency Medicine

## 2014-06-05 DIAGNOSIS — M7021 Olecranon bursitis, right elbow: Secondary | ICD-10-CM

## 2014-06-05 MED ORDER — INDOMETHACIN 25 MG PO CAPS: 25.0000 mg | ORAL_CAPSULE | Freq: Three times a day (TID) | ORAL | Status: DC

## 2014-06-05 NOTE — ED Provider Notes (Signed)
CSN: [id number]     Arrival date & time 06/05/14  1117 History   First MD Initiated Contact with Patient 06/05/14 1139     Chief Complaint  Patient presents with  . Extremity Laceration   (Consider location/radiation/quality/duration/timing/severity/associated sxs/prior Treatment) HPI Comments: Patient reports she developed right elbow discomfort that began last night without known injury. Denies previous events or recent new or increased activity. Right hand dominant. Unemployed. No known history of gout. Denies fever, chills or injury to skin. Has tried taking percocet from previous surgical procedure with some relief. PCP: Dr. Arbie Cookey webb Ortho: Dr. Trenton Gammon   The history is provided by the patient.    Past Medical History  Diagnosis Date  . Hypertension   . Diabetes mellitus without complication   . Asthma   . Fatty liver   . Morbid obesity with BMI of 40.0-44.9, adult   . OSA (obstructive sleep apnea)     mild with AHI 11/hr now on CPAP at 20cm H2O   Past Surgical History  Procedure Laterality Date  . Shoulder surgery    . Carpal tunnel release      right  . Back surgery      l4-5  . Spinal cord stimulator implant    . Abdominal hysterectomy     Family History  Problem Relation Age of Onset  . Hypertension Mother   . Diabetes Mother   . Lymphoma Mother   . Breast cancer Mother   . Hypertension Father   . Diabetes Father    History  Substance Use Topics  . Smoking status: Former Research scientist (life sciences)  . Smokeless tobacco: Never Used  . Alcohol Use: Yes     Comment: socially    OB History    No data available     Review of Systems  All other systems reviewed and are negative.   Allergies  Review of patient's allergies indicates no known allergies.  Home Medications   Prior to Admission medications   Medication Sig Start Date End Date Taking? Authorizing Provider  amLODipine (NORVASC) 10 MG tablet  04/11/14   Historical Provider, MD  amLODipine (NORVASC) 5 MG  tablet Take 5 mg by mouth every morning.    Historical Provider, MD  aspirin 81 MG tablet Take 81 mg by mouth every morning.     Historical Provider, MD  Aspirin-Acetaminophen-Caffeine (GOODY HEADACHE PO) Take 1 Package by mouth as needed (headache).    Historical Provider, MD  buPROPion (WELLBUTRIN) 100 MG tablet Take 100 mg by mouth 2 (two) times daily. 04/11/14   Historical Provider, MD  Calcium Carbonate-Vitamin D (CALCIUM 600 + D PO) Take 1 tablet by mouth every morning.     Historical Provider, MD  carboxymethylcellulose (REFRESH PLUS) 0.5 % SOLN Place 1 drop into both eyes 3 (three) times daily as needed (dry eyes).    Historical Provider, MD  doxazosin (CARDURA) 2 MG tablet Take 2 mg by mouth every evening.     Historical Provider, MD  Eszopiclone 3 MG TABS Take 3 mg by mouth at bedtime as needed. Take immediately before bedtime    Historical Provider, MD  gabapentin (NEURONTIN) 800 MG tablet Take 800 mg by mouth 4 (four) times daily as needed (pain).    Historical Provider, MD  glimepiride (AMARYL) 2 MG tablet Take 2 mg by mouth daily before breakfast.    Historical Provider, MD  hydrochlorothiazide (HYDRODIURIL) 25 MG tablet Take 25 mg by mouth every other day.    Historical Provider,  MD  indomethacin (INDOCIN) 25 MG capsule Take 1 capsule (25 mg total) by mouth 3 (three) times daily with meals. X 5 days and then TID prn pain 06/05/14   Audelia Hives Jaylina Ramdass, PA  metFORMIN (GLUCOPHAGE-XR) 500 MG 24 hr tablet Take 2,000 mg by mouth daily with supper.    Historical Provider, MD  metoprolol (TOPROL-XL) 200 MG 24 hr tablet Take 200 mg by mouth every evening.     Historical Provider, MD  potassium chloride SA (K-DUR,KLOR-CON) 20 MEQ tablet Take 1 tablet (20 mEq total) by mouth daily. 05/19/13   Sueanne Margarita, MD   BP 124/75 mmHg  Pulse 70  Temp(Src) 98 F (36.7 C) (Oral)  SpO2 95% Physical Exam  Constitutional: She is oriented to person, place, and time. She appears well-developed and  well-nourished. No distress.  +obese  HENT:  Head: Normocephalic and atraumatic.  Eyes: Conjunctivae are normal.  Cardiovascular: Normal rate.   Pulmonary/Chest: Effort normal.  Musculoskeletal:       Right elbow: She exhibits decreased range of motion. She exhibits no swelling, no effusion, no deformity and no laceration. Tenderness found. Lateral epicondyle and olecranon process tenderness noted.  +point tenderness over right olecranon bursa  Neurological: She is alert and oriented to person, place, and time.  Skin: Skin is warm and dry. No rash noted. No erythema.  Psychiatric: She has a normal mood and affect. Her behavior is normal.  Nursing note and vitals reviewed.   ED Course  Procedures (including critical care time) Labs Review Labs Reviewed - No data to display  Imaging Review No results found.   MDM   1. Olecranon bursitis, right    Exam suggests olecranon bursitis. Will treat with NSAIDs and RICE therapy rather than oral steroid given that patient is a diabetic. Reports normal renal function. Would like to avoid causing hyperglycemia. Patient advised to follow up her orthopedist (Dr. Rolena Infante) if symptoms do not improve.  Exam and vital signs do not suggest septic joint.    Lutricia Feil, [place] 06/05/14 [phone]

## 2014-06-05 NOTE — ED Notes (Signed)
Applied sling to right arm to be used only for icing right elbow

## 2014-06-05 NOTE — ED Notes (Signed)
Right elbow pain, no known injury, 06/04/2014

## 2014-06-05 NOTE — Discharge Instructions (Signed)
Olecranon Bursitis Bursitis is swelling and soreness (inflammation) of a fluid-filled sac (bursa) that covers and protects a joint. Olecranon bursitis occurs over the elbow.  CAUSES Bursitis can be caused by injury, overuse of the joint, arthritis, or infection.  SYMPTOMS   Tenderness, swelling, warmth, or redness over the elbow.  Elbow pain with movement. This is greater with bending the elbow.  Squeaking sound when the bursa is rubbed or moved.  Increasing size of the bursa without pain or discomfort.  Fever with increasing pain and swelling if the bursa becomes infected. HOME CARE INSTRUCTIONS   Put ice on the affected area.  Put ice in a plastic bag.  Place a towel between your skin and the bag.  Leave the ice on for 15-20 minutes each hour while awake. Do this for the first 2 days.  When resting, elevate your elbow above the level of your heart. This helps reduce swelling.  Continue to put the joint through a full range of motion 4 times per day. Rest the injured joint at other times. When the pain lessens, begin normal slow movements and usual activities.  Only take over-the-counter or prescription medicines for pain, discomfort, or fever as directed by your caregiver.  Reduce your intake of milk and related dairy products (cheese, yogurt). They may make your condition worse. SEEK IMMEDIATE MEDICAL CARE IF:   Your pain increases even during treatment.  You have a fever.  You have heat and inflammation over the bursa and elbow.  You have a red line that goes up your arm.  You have pain with movement of your elbow. MAKE SURE YOU:   Understand these instructions.  Will watch your condition.  Will get help right away if you are not doing well or get worse. Document Released: 07/01/2006 Document Revised: 08/24/2011 Document Reviewed: 05/17/2007 Exit[employer] Patient Information 2015 [employer]. This information is not intended to replace advice given to you by your  health care provider. Make sure you discuss any questions you have with your health care provider.  

## 2014-06-09 ENCOUNTER — Encounter (HOSPITAL_COMMUNITY): Payer: Self-pay | Admitting: Emergency Medicine

## 2014-06-09 ENCOUNTER — Emergency Department (INDEPENDENT_AMBULATORY_CARE_PROVIDER_SITE_OTHER)
Admission: EM | Admit: 2014-06-09 | Discharge: 2014-06-09 | Disposition: A | Discharge status: Home / Self Care | Payer: BLUE CROSS/BLUE SHIELD | Source: Home / Self Care | Attending: Family Medicine | Admitting: Family Medicine

## 2014-06-09 DIAGNOSIS — J040 Acute laryngitis: Secondary | ICD-10-CM | POA: Diagnosis not present

## 2014-06-09 DIAGNOSIS — J069 Acute upper respiratory infection, unspecified: Secondary | ICD-10-CM | POA: Diagnosis not present

## 2014-06-09 DIAGNOSIS — B9789 Other viral agents as the cause of diseases classified elsewhere: Principal | ICD-10-CM

## 2014-06-09 DIAGNOSIS — R0982 Postnasal drip: Secondary | ICD-10-CM

## 2014-06-09 MED ORDER — DEXAMETHASONE SODIUM PHOSPHATE 10 MG/ML IJ SOLN
10.0000 mg | Freq: Once | INTRAMUSCULAR | Status: AC
  Administered 2014-06-09: 10 mg via INTRAMUSCULAR

## 2014-06-09 MED ORDER — DEXAMETHASONE SODIUM PHOSPHATE 10 MG/ML IJ SOLN
INTRAMUSCULAR | Status: AC
  Filled 2014-06-09: qty 1

## 2014-06-09 MED ORDER — IPRATROPIUM BROMIDE 0.06 % NA SOLN: 2.0000 | Freq: Four times a day (QID) | NASAL | Status: DC

## 2014-06-09 MED ORDER — FLUTICASONE PROPIONATE 50 MCG/ACT NA SUSP: 2.0000 | Freq: Every day | NASAL | Status: DC

## 2014-06-09 NOTE — Discharge Instructions (Signed)
Your symptoms are likely due to post nasal drip causing irritation to the upper airway THis is likely from irritation from not cleaning your humidifier and from a viral illness You were given steroids which will last for around 2 days.  Please use the nasal atrovent and flonase (before bed) for symptomatic relief Please also consider using mucinex and nasal saline Please call or come back if you are not better or get worse in 7 days.

## 2014-06-09 NOTE — ED Provider Notes (Signed)
CSN: [id number]     Arrival date & time 06/09/14  1552 History   First MD Initiated Contact with Patient 06/09/14 1653     Chief Complaint  Patient presents with  . Laryngitis  . Nasal Congestion   (Consider location/radiation/quality/duration/timing/severity/associated sxs/prior Treatment) HPI  Coughing: Started w/ runny nose4 2 days ago. Initially w/ clear discharge. This morning coughed lots of greenish material this morning. Denies nausea, vomiting, diarreha, sore throat, SOB, wheezing, CP. Getting worse. No fevers. Has not tried anything to make this better.    R elbow pain: following recommendations of NSAIDs, and rest w/o much improvement. F/u appt w/ Dr. Rolena Infante scheduled   Past Medical History  Diagnosis Date  . Hypertension   . Diabetes mellitus without complication   . Asthma   . Fatty liver   . Morbid obesity with BMI of 40.0-44.9, adult   . OSA (obstructive sleep apnea)     mild with AHI 11/hr now on CPAP at 20cm H2O   Past Surgical History  Procedure Laterality Date  . Shoulder surgery    . Carpal tunnel release      right  . Back surgery      l4-5  . Spinal cord stimulator implant    . Abdominal hysterectomy     Family History  Problem Relation Age of Onset  . Hypertension Mother   . Diabetes Mother   . Lymphoma Mother   . Breast cancer Mother   . Hypertension Father   . Diabetes Father    History  Substance Use Topics  . Smoking status: Former Research scientist (life sciences)  . Smokeless tobacco: Never Used  . Alcohol Use: Yes     Comment: socially    OB History    No data available     Review of Systems Per HPI with all other pertinent systems negative.   Allergies  Review of patient's allergies indicates no known allergies.  Home Medications   Prior to Admission medications   Medication Sig Start Date End Date Taking? Authorizing Provider  amLODipine (NORVASC) 10 MG tablet  04/11/14   Historical Provider, MD  amLODipine (NORVASC) 5 MG tablet Take 5 mg by  mouth every morning.    Historical Provider, MD  aspirin 81 MG tablet Take 81 mg by mouth every morning.     Historical Provider, MD  Aspirin-Acetaminophen-Caffeine (GOODY HEADACHE PO) Take 1 Package by mouth as needed (headache).    Historical Provider, MD  buPROPion (WELLBUTRIN) 100 MG tablet Take 100 mg by mouth 2 (two) times daily. 04/11/14   Historical Provider, MD  Calcium Carbonate-Vitamin D (CALCIUM 600 + D PO) Take 1 tablet by mouth every morning.     Historical Provider, MD  carboxymethylcellulose (REFRESH PLUS) 0.5 % SOLN Place 1 drop into both eyes 3 (three) times daily as needed (dry eyes).    Historical Provider, MD  doxazosin (CARDURA) 2 MG tablet Take 2 mg by mouth every evening.     Historical Provider, MD  Eszopiclone 3 MG TABS Take 3 mg by mouth at bedtime as needed. Take immediately before bedtime    Historical Provider, MD  fluticasone (FLONASE) 50 MCG/ACT nasal spray Place 2 sprays into both nostrils at bedtime. 06/09/14   Waldemar Dickens, MD  gabapentin (NEURONTIN) 800 MG tablet Take 800 mg by mouth 4 (four) times daily as needed (pain).    Historical Provider, MD  glimepiride (AMARYL) 2 MG tablet Take 2 mg by mouth daily before breakfast.    Historical Provider,  MD  hydrochlorothiazide (HYDRODIURIL) 25 MG tablet Take 25 mg by mouth every other day.    Historical Provider, MD  indomethacin (INDOCIN) 25 MG capsule Take 1 capsule (25 mg total) by mouth 3 (three) times daily with meals. X 5 days and then TID prn pain 06/05/14   Audelia Hives Presson, PA  ipratropium (ATROVENT) 0.06 % nasal spray Place 2 sprays into both nostrils 4 (four) times daily. 06/09/14   Waldemar Dickens, MD  metFORMIN (GLUCOPHAGE-XR) 500 MG 24 hr tablet Take 2,000 mg by mouth daily with supper.    Historical Provider, MD  metoprolol (TOPROL-XL) 200 MG 24 hr tablet Take 200 mg by mouth every evening.     Historical Provider, MD  potassium chloride SA (K-DUR,KLOR-CON) 20 MEQ tablet TAKE 1 TABLET BY MOUTH  EVERY DAY 06/05/14   Sueanne Margarita, MD   BP 142/86 mmHg  Pulse 78  Temp(Src) 97.4 F (36.3 C) (Oral)  Resp 16  SpO2 95% Physical Exam  Constitutional: She appears well-developed and well-nourished. No distress.  HENT:  Head: Normocephalic and atraumatic.  Skin: She is not diaphoretic.    ED Course  Procedures (including critical care time) Labs Review Labs Reviewed - No data to display  Imaging Review No results found.   MDM   1. Viral URI with cough   2. Post-nasal drip   3. Laryngitis    Likely from viral and allergic irritation. -  Pt to clean water trap more often Decadron 10mg  IM in office Start nasal atrovent, flonase, mucinex, nasal saline. reutrn if not improving in 7 days or worsens for possible ABX.  F/u Ortho ro R elbow.   Precautions given and all questions answered  Linna Darner, MD Family Medicine 06/09/2014, 5:18 PM      Waldemar Dickens, MD 06/09/14 [phone]

## 2014-06-09 NOTE — ED Notes (Signed)
Pt states that she has had congestion for 2 day with loss of voice started today.

## 2014-07-30 ENCOUNTER — Encounter: Payer: Self-pay | Admitting: Cardiology

## 2014-11-20 ENCOUNTER — Other Ambulatory Visit: Payer: Self-pay | Admitting: Cardiology

## 2014-12-12 DIAGNOSIS — E119 Type 2 diabetes mellitus without complications: Secondary | ICD-10-CM | POA: Diagnosis not present

## 2014-12-12 DIAGNOSIS — Z713 Dietary counseling and surveillance: Secondary | ICD-10-CM | POA: Diagnosis not present

## 2014-12-12 DIAGNOSIS — E1165 Type 2 diabetes mellitus with hyperglycemia: Secondary | ICD-10-CM | POA: Diagnosis not present

## 2014-12-12 DIAGNOSIS — F330 Major depressive disorder, recurrent, mild: Secondary | ICD-10-CM | POA: Diagnosis not present

## 2014-12-12 DIAGNOSIS — Z6841 Body Mass Index (BMI) 40.0 and over, adult: Secondary | ICD-10-CM | POA: Diagnosis not present

## 2014-12-12 DIAGNOSIS — G4700 Insomnia, unspecified: Secondary | ICD-10-CM | POA: Diagnosis not present

## 2014-12-24 ENCOUNTER — Encounter (HOSPITAL_COMMUNITY): Payer: Self-pay | Admitting: Emergency Medicine

## 2014-12-24 ENCOUNTER — Emergency Department (INDEPENDENT_AMBULATORY_CARE_PROVIDER_SITE_OTHER)
Admission: EM | Admit: 2014-12-24 | Discharge: 2014-12-24 | Disposition: A | Discharge status: Home / Self Care | Payer: BLUE CROSS/BLUE SHIELD | Source: Home / Self Care | Attending: Family Medicine | Admitting: Family Medicine

## 2014-12-24 DIAGNOSIS — S90121A Contusion of right lesser toe(s) without damage to nail, initial encounter: Secondary | ICD-10-CM

## 2014-12-24 MED ORDER — HYDROCODONE-ACETAMINOPHEN 5-325 MG PO TABS: 1.0000 | ORAL_TABLET | Freq: Four times a day (QID) | ORAL | Status: DC | PRN

## 2014-12-24 NOTE — Discharge Instructions (Signed)
Ice for 2-3 days and shoe and pain medicine as needed, activity as tolerated.

## 2014-12-24 NOTE — ED Notes (Signed)
Pt jammed her right pinky toe into the tire of her car last night.  Since then, she has had increased pain, swelling and bruising.
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
## 2014-12-26 ENCOUNTER — Other Ambulatory Visit: Payer: Self-pay | Admitting: Cardiology

## 2015-01-09 DIAGNOSIS — Z5181 Encounter for therapeutic drug level monitoring: Secondary | ICD-10-CM | POA: Diagnosis not present

## 2015-01-09 DIAGNOSIS — Z713 Dietary counseling and surveillance: Secondary | ICD-10-CM | POA: Diagnosis not present

## 2015-01-09 DIAGNOSIS — Z6841 Body Mass Index (BMI) 40.0 and over, adult: Secondary | ICD-10-CM | POA: Diagnosis not present

## 2015-02-11 DIAGNOSIS — Z713 Dietary counseling and surveillance: Secondary | ICD-10-CM | POA: Diagnosis not present

## 2015-02-11 DIAGNOSIS — Z6834 Body mass index (BMI) 34.0-34.9, adult: Secondary | ICD-10-CM | POA: Diagnosis not present

## 2015-02-11 DIAGNOSIS — Z6841 Body Mass Index (BMI) 40.0 and over, adult: Secondary | ICD-10-CM | POA: Diagnosis not present

## 2015-05-08 ENCOUNTER — Other Ambulatory Visit: Payer: Self-pay | Admitting: Cardiology

## 2015-12-09 DIAGNOSIS — M1990 Unspecified osteoarthritis, unspecified site: Secondary | ICD-10-CM | POA: Diagnosis not present

## 2015-12-11 ENCOUNTER — Telehealth: Payer: Self-pay | Admitting: Podiatry

## 2015-12-11 NOTE — Telephone Encounter (Signed)
Lm with female to have pt call me back to schedule an appt.

## 2016-01-06 ENCOUNTER — Ambulatory Visit: Payer: BLUE CROSS/BLUE SHIELD | Admitting: Podiatry

## 2016-01-10 ENCOUNTER — Ambulatory Visit (INDEPENDENT_AMBULATORY_CARE_PROVIDER_SITE_OTHER): Payer: BLUE CROSS/BLUE SHIELD

## 2016-01-10 ENCOUNTER — Encounter: Payer: Self-pay | Admitting: Podiatry

## 2016-01-10 ENCOUNTER — Ambulatory Visit (INDEPENDENT_AMBULATORY_CARE_PROVIDER_SITE_OTHER): Payer: BLUE CROSS/BLUE SHIELD | Admitting: Podiatry

## 2016-01-10 VITALS — BP 154/85 | HR 69 | Resp 18

## 2016-01-10 DIAGNOSIS — M7740 Metatarsalgia, unspecified foot: Secondary | ICD-10-CM

## 2016-01-10 DIAGNOSIS — R52 Pain, unspecified: Secondary | ICD-10-CM | POA: Diagnosis not present

## 2016-01-10 DIAGNOSIS — G629 Polyneuropathy, unspecified: Secondary | ICD-10-CM

## 2016-01-10 DIAGNOSIS — B351 Tinea unguium: Secondary | ICD-10-CM | POA: Diagnosis not present

## 2016-01-10 MED ORDER — NONFORMULARY OR COMPOUNDED ITEM: 2 refills | Status: DC

## 2016-01-10 NOTE — Progress Notes (Signed)
   Subjective:    Patient ID: [name], female    DOB: Nov 07, 1965, 50 y.o.   MRN: [id number]  HPI  50 year old female presents the also concerns of right big toenail thickening discoloration. She's been using over-the-counter treatment for the past year this has been helping quite a bit and she has noticed a new clear nail come in. Denies any pain or drainage of the toenail. She states that she feels that she may be losing sensation to both of her feet but she denies any numbness or tingling or any burning pain or any symptoms of neuropathy. She denies any claudication symptoms as well.  Review of Systems  All other systems reviewed and are negative.      Objective:   Physical Exam General: AAO x3, NAD  Dermatological: Right hallux toe nails hypertrophic, dystrophic and somewhat discolored however the proximal nail appears to be more clear any of nail coming in. There is no tendernessany redness or drainage. No open lesions identified at this time. No pre-ulcerative lesions.  Vascular: Dorsalis Pedis artery and Posterior Tibial artery pedal pulses are 2/4 bilateral with immedate capillary fill time. Pedal hair growth present. No varicosities and no lower extremity edema present bilateral. There is no pain with calf compression, swelling, warmth, erythema.   Neruologic: Sensation mildly decreased with Simms Weinstein monofilament to the forefoot bilaterally. Vibratory sensation intact.  Musculoskeletal: I'll discomfort submetatarsal 1-5 bilaterally. No specific area pinpoint bony tenderness or pain the vibratory sensation. MMT 5/5.  Gait: Unassisted, Nonantalgic.      Assessment & Plan:  50 year old female right hallux onychodystrophy likely onychomycosis, early neuropathy, metatarsalgia -Treatment options discussed including all alternatives, risks, and complications -Etiology of symptoms were discussed -X-rays were obtained and reviewed with the patient. No evidence  of acute fracture or stress fracture this time. -Metatarsal offloading pads were dispensed. Discussed shoe gear modifications as well. -Discussed neuropathy as well as treatments. She is having no symptoms below. On treatment continue to monitor the area. -Order compound cream for onychomycosis. -Follow-up as scheduled or sooner if any issues are to arise. Call any questions or concerns the meantime.  Celesta Gentile, DPM

## 2016-01-17 DIAGNOSIS — F330 Major depressive disorder, recurrent, mild: Secondary | ICD-10-CM | POA: Diagnosis not present

## 2016-01-17 DIAGNOSIS — Z6841 Body Mass Index (BMI) 40.0 and over, adult: Secondary | ICD-10-CM | POA: Diagnosis not present

## 2016-02-11 DIAGNOSIS — J069 Acute upper respiratory infection, unspecified: Secondary | ICD-10-CM | POA: Diagnosis not present

## 2016-02-16 ENCOUNTER — Observation Stay (HOSPITAL_COMMUNITY)
Admission: EM | Admit: 2016-02-16 | Discharge: 2016-02-18 | Disposition: A | Discharge status: Home / Self Care | Payer: BLUE CROSS/BLUE SHIELD | Attending: Internal Medicine | Admitting: Internal Medicine

## 2016-02-16 ENCOUNTER — Emergency Department (HOSPITAL_COMMUNITY): Payer: BLUE CROSS/BLUE SHIELD

## 2016-02-16 ENCOUNTER — Encounter (HOSPITAL_COMMUNITY): Payer: Self-pay | Admitting: Emergency Medicine

## 2016-02-16 DIAGNOSIS — Z87891 Personal history of nicotine dependence: Secondary | ICD-10-CM | POA: Insufficient documentation

## 2016-02-16 DIAGNOSIS — J45909 Unspecified asthma, uncomplicated: Secondary | ICD-10-CM | POA: Diagnosis not present

## 2016-02-16 DIAGNOSIS — G4733 Obstructive sleep apnea (adult) (pediatric): Secondary | ICD-10-CM | POA: Diagnosis present

## 2016-02-16 DIAGNOSIS — R079 Chest pain, unspecified: Secondary | ICD-10-CM | POA: Diagnosis present

## 2016-02-16 DIAGNOSIS — R0789 Other chest pain: Secondary | ICD-10-CM | POA: Diagnosis not present

## 2016-02-16 DIAGNOSIS — Z7984 Long term (current) use of oral hypoglycemic drugs: Secondary | ICD-10-CM | POA: Diagnosis not present

## 2016-02-16 DIAGNOSIS — Z79899 Other long term (current) drug therapy: Secondary | ICD-10-CM | POA: Insufficient documentation

## 2016-02-16 DIAGNOSIS — Z7982 Long term (current) use of aspirin: Secondary | ICD-10-CM | POA: Diagnosis not present

## 2016-02-16 DIAGNOSIS — Z6841 Body Mass Index (BMI) 40.0 and over, adult: Secondary | ICD-10-CM | POA: Insufficient documentation

## 2016-02-16 DIAGNOSIS — I10 Essential (primary) hypertension: Secondary | ICD-10-CM | POA: Diagnosis present

## 2016-02-16 DIAGNOSIS — E876 Hypokalemia: Secondary | ICD-10-CM

## 2016-02-16 DIAGNOSIS — E119 Type 2 diabetes mellitus without complications: Secondary | ICD-10-CM | POA: Diagnosis not present

## 2016-02-16 DIAGNOSIS — R072 Precordial pain: Secondary | ICD-10-CM | POA: Diagnosis present

## 2016-02-16 LAB — BASIC METABOLIC PANEL
ANION GAP: 7 (ref 5–15)
BUN: 10 mg/dL (ref 6–20)
CO2: 25 mmol/L (ref 22–32)
Calcium: 9.2 mg/dL (ref 8.9–10.3)
Chloride: 105 mmol/L (ref 101–111)
Creatinine, Ser: 0.53 mg/dL (ref 0.44–1.00)
GFR calc Af Amer: >60 mL/min (ref >60)
Glucose, Bld: 99 mg/dL (ref 65–99)
POTASSIUM: 3.4 mmol/L — AB (ref 3.5–5.1)
SODIUM: 137 mmol/L (ref 135–145)

## 2016-02-16 LAB — I-STAT TROPONIN, ED: TROPONIN I, POC: 0 ng/mL (ref 0.00–0.08)

## 2016-02-16 LAB — CBC
HEMATOCRIT: 39.2 % (ref 36.0–46.0)
HEMOGLOBIN: 12.7 g/dL (ref 12.0–15.0)
MCH: 28.2 pg (ref 26.0–34.0)
MCHC: 32.4 g/dL (ref 30.0–36.0)
MCV: 87.1 fL (ref 78.0–100.0)
Platelets: 262 10*3/uL (ref 150–400)
RBC: 4.5 MIL/uL (ref 3.87–5.11)
RDW: 13.7 % (ref 11.5–15.5)
WBC: 7.2 10*3/uL (ref 4.0–10.5)

## 2016-02-16 MED ORDER — MORPHINE SULFATE (PF) 4 MG/ML IV SOLN
4.0000 mg | Freq: Once | INTRAVENOUS | Status: AC
  Administered 2016-02-16: 4 mg via INTRAVENOUS
  Filled 2016-02-16: qty 1

## 2016-02-16 MED ORDER — NITROGLYCERIN 0.4 MG SL SUBL
0.4000 mg | SUBLINGUAL_TABLET | SUBLINGUAL | Status: DC | PRN
  Administered 2016-02-16 – 2016-02-17 (×5): 0.4 mg via SUBLINGUAL
  Filled 2016-02-16 (×4): qty 1

## 2016-02-16 MED ORDER — GI COCKTAIL ~~LOC~~
30.0000 mL | Freq: Once | ORAL | Status: AC
  Administered 2016-02-16: 30 mL via ORAL
  Filled 2016-02-16: qty 30

## 2016-02-16 NOTE — ED Triage Notes (Signed)
Pt reports left sided chest pain reported as tightness/sharpness. States she has been outside most of the day at the water park with family and when she got home felt chest pain while in the shower.

## 2016-02-16 NOTE — ED Notes (Signed)
Pt transported to xray 

## 2016-02-16 NOTE — ED Provider Notes (Signed)
[place] DEPT Provider Note   CSN: [id number] Arrival date & time: 02/16/16  2127     History   Chief Complaint Chief Complaint  Patient presents with  . Chest Pain    HPI [name] is a 50 y.o. female.  The history is provided by the patient. No language interpreter was used.  Chest Pain   This is a new problem. The current episode started 1 to 2 hours ago. The problem occurs constantly. The problem has not changed since onset.Associated with: Began spontaneously. The pain is present in the substernal region. The pain is at a severity of 4/10. The pain is moderate. The quality of the pain is described as pressure-like. The pain does not radiate. The symptoms are aggravated by certain positions. Associated symptoms include nausea and shortness of breath. Pertinent negatives include no abdominal pain, no back pain, no cough, no fever, no headaches, no lower extremity edema, no vomiting and no weakness. She has tried nitroglycerin and antacids for the symptoms. The treatment provided mild relief. Risk factors include obesity. Past medical history comments: HTN, DM2  Her family medical history is significant for CAD.  Procedure history is positive for stress echo.    Past Medical History:  Diagnosis Date  . Asthma   . Diabetes mellitus without complication ([place])   . Fatty liver   . Hypertension   . Morbid obesity with BMI of 40.0-44.9, adult ([place])   . OSA (obstructive sleep apnea)    mild with AHI 11/hr now on CPAP at 20cm H2O    Patient Active Problem List   Diagnosis Date Noted  . Chest pain 02/16/2016  . Morbid obesity with BMI of 40.0-44.9, adult ([place]) 05/15/2013  . Fatty liver   . OSA (obstructive sleep apnea)   . Hypertension   . Diabetes mellitus without complication ([place])   . Asthma     Past Surgical History:  Procedure Laterality Date  . ABDOMINAL HYSTERECTOMY    . BACK SURGERY     l4-5  . CARPAL TUNNEL RELEASE     right  . SHOULDER  SURGERY    . SPINAL CORD STIMULATOR IMPLANT      OB History    No data available       Home Medications    Prior to Admission medications   Medication Sig Start Date End Date Taking? Authorizing Provider  amLODipine (NORVASC) 10 MG tablet Take 10 mg by mouth every morning.  04/11/14  Yes Historical Provider, MD  aspirin 81 MG tablet Take 81 mg by mouth every evening. chewable   Yes Historical Provider, MD  Aspirin-Acetaminophen-Caffeine (GOODY HEADACHE PO) Take 1 Package by mouth as needed (headache).   Yes Historical Provider, MD  Calcium Carbonate-Vitamin D (CALCIUM 600 + D PO) Take 2 tablets by mouth every morning.    Yes Historical Provider, MD  carboxymethylcellulose (REFRESH PLUS) 0.5 % SOLN Place 1 drop into both eyes 3 (three) times daily as needed (dry eyes).   Yes Historical Provider, MD  Eszopiclone 3 MG TABS Take 3 mg by mouth at bedtime as needed (sleep). Take immediately before bedtime    Yes Historical Provider, MD  glimepiride (AMARYL) 2 MG tablet Take 4 mg by mouth daily before breakfast.    Yes Historical Provider, MD  hydrochlorothiazide (HYDRODIURIL) 25 MG tablet Take 25 mg by mouth daily as needed (fluid).    Yes Historical Provider, MD  metFORMIN (GLUCOPHAGE-XR) 500 MG 24 hr tablet Take 2,000 mg by mouth  daily with supper.   Yes Historical Provider, MD  metoprolol (TOPROL-XL) 200 MG 24 hr tablet Take 200 mg by mouth every evening.    Yes Historical Provider, MD  NONFORMULARY OR COMPOUNDED [place] compound:  Onychomycosis Nail Lacquer - fluconazole 2%, Terbinafine 1%, DMSO, apply to affected area daily. 01/10/16  Yes Trula Slade, DPM  phentermine 30 MG capsule Take 30 mg by mouth every morning.   Yes Historical Provider, MD  sertraline (ZOLOFT) 50 MG tablet Take 75 mg by mouth every morning.   Yes Historical Provider, MD    Family History Family History  Problem Relation Age of Onset  . Hypertension Mother   . Diabetes Mother   . Lymphoma  Mother   . Breast cancer Mother   . Hypertension Father   . Diabetes Father     Social History Social History  Substance Use Topics  . Smoking status: Former Research scientist (life sciences)  . Smokeless tobacco: Never Used  . Alcohol use Yes     Comment: socially      Allergies   Review of patient's allergies indicates no known allergies.   Review of Systems Review of Systems  Constitutional: Negative for chills and fever.  HENT: Negative for congestion, rhinorrhea and sore throat.   Eyes: Negative for visual disturbance.  Respiratory: Positive for shortness of breath. Negative for cough and wheezing.   Cardiovascular: Positive for chest pain. Negative for leg swelling.  Gastrointestinal: Positive for nausea. Negative for abdominal pain, diarrhea and vomiting.  Genitourinary: Negative for dysuria, flank pain, vaginal bleeding and vaginal discharge.  Musculoskeletal: Negative for back pain and neck pain.  Skin: Negative for rash.  Allergic/Immunologic: Negative for immunocompromised state.  Neurological: Negative for syncope, weakness and headaches.  Hematological: Does not bruise/bleed easily.  All other systems reviewed and are negative.    Physical Exam Updated Vital Signs BP 128/81   Pulse 60   Temp 98.2 F (36.8 C) (Oral)   Resp 18   Ht 5\' 6"  (1.676 m)   Wt 251 lb (113.9 kg)   SpO2 94%   BMI 40.51 kg/m   Physical Exam  Constitutional: She is oriented to person, place, and time. She appears well-developed and well-nourished. No distress.  HENT:  Head: Normocephalic and atraumatic.  Eyes: Conjunctivae are normal.  Neck: Neck supple.  Cardiovascular: Normal rate, regular rhythm and normal heart sounds.  Exam reveals no friction rub.   No murmur heard. Pulmonary/Chest: Effort normal and breath sounds normal. No respiratory distress. She has no wheezes. She has no rales.  Abdominal: She exhibits no distension.  Musculoskeletal: She exhibits no edema.  Neurological: She is alert  and oriented to person, place, and time. She exhibits normal muscle tone.  Skin: Skin is warm. Capillary refill takes less than 2 seconds.  Psychiatric: She has a normal mood and affect.  Nursing note and vitals reviewed.    ED Treatments / Results  Labs (all labs ordered are listed, but only abnormal results are displayed) Labs Reviewed  BASIC METABOLIC PANEL - Abnormal; Notable for the following:       Result Value   Potassium 3.4 (*)    All other components within normal limits  CBC  TROPONIN I  TROPONIN I  TROPONIN I  BASIC METABOLIC PANEL  I-STAT TROPOININ, ED    EKG  EKG Interpretation None       Radiology Dg Chest 2 View  Result Date: 02/16/2016 CLINICAL DATA:  Initial evaluation for acute left-sided chest  pain. EXAM: CHEST  2 VIEW COMPARISON:  Prior radiograph from 01/03/2013. FINDINGS: Mild cardiomegaly is stable. Mediastinal silhouette within normal limits. Tortuosity the intrathoracic aorta noted. The lungs are normally inflated. No airspace consolidation, pleural effusion, or pulmonary edema is identified. There is no pneumothorax. No acute osseous abnormality identified. Electrode related the spinal stimulator device noted, stable. IMPRESSION: 1. No active cardiopulmonary disease. 2. Stable mild cardiomegaly without pulmonary edema. Electronically Signed   By: Jeannine Boga M.D.   On: 02/16/2016 22:19    Procedures Procedures (including critical care time)  Medications Ordered in ED Medications  nitroGLYCERIN (NITROSTAT) SL tablet 0.4 mg (0.4 mg Sublingual Given 02/16/16 2306)  metoprolol succinate (TOPROL-XL) 24 hr tablet 200 mg (not administered)  aspirin tablet 81 mg (not administered)  zolpidem (AMBIEN) tablet 5 mg (not administered)  carboxymethylcellulose (REFRESH PLUS) 0.5 % ophthalmic solution 1 drop (not administered)  amLODipine (NORVASC) tablet 10 mg (not administered)  sertraline (ZOLOFT) tablet 75 mg (not administered)  acetaminophen  (TYLENOL) tablet 650 mg (not administered)  ondansetron (ZOFRAN) injection 4 mg (not administered)  enoxaparin (LOVENOX) injection 40 mg (not administered)  morphine 2 MG/ML injection 2 mg (not administered)  gi cocktail (Maalox,Lidocaine,Donnatal) (not administered)  potassium chloride SA (K-DUR,KLOR-CON) CR tablet 20 mEq (not administered)  gi cocktail (Maalox,Lidocaine,Donnatal) (30 mLs Oral Given 02/16/16 2239)  morphine 4 MG/ML injection 4 mg (4 mg Intravenous Given 02/16/16 2312)     Initial Impression / Assessment and Plan / ED Course  I have reviewed the triage vital signs and the nursing notes.  Pertinent labs & imaging results that were available during my care of the patient were reviewed by me and considered in my medical decision making (see chart for details).  Clinical Course   50 yo AAF with PMHx of HTN, DM2, strong family h/o CAD (age<55) who p/w substernal chest pressure and SOB. On arrival, VSS and WNL. EKG is non-ischemic with no acute changes. Suspect atypical CP, though ACS is on DDx. HEART score is 5. Will plan for delta trop, labs, imaging, further evaluation. Pain is not sharp, tearing, pulses symmetric - doubt dissection. No tachypnea, tachycardia, LE edema, or signs of DVT or PE. Abdomen soft, NT, ND without signs of cholecystitis or pancreatitis.  Initial trop negative, labs unremarkable. CP free after morphine. Will admit to Hospitalist.  Final Clinical Impressions(s) / ED Diagnoses   Final diagnoses:  Other chest pain  Essential hypertension  Morbid obesity, unspecified obesity type [facility])    New Prescriptions New Prescriptions   No medications on file     Duffy Bruce, MD 02/17/16 0116

## 2016-02-16 NOTE — ED Notes (Addendum)
Pt reports she came home after a day of being outside at water park. States she feels a constant dull cramping that will start to get sharp in the left side of her chest. States she noticed it first while taking a shower. States it doesn't move anywhere, stays in left side of chest. Hx of HTN and Diabetes. States pain doesn't change with movement/deep breaths.

## 2016-02-16 NOTE — ED Notes (Signed)
MD at bedside. 

## 2016-02-17 ENCOUNTER — Observation Stay (HOSPITAL_BASED_OUTPATIENT_CLINIC_OR_DEPARTMENT_OTHER): Payer: BLUE CROSS/BLUE SHIELD

## 2016-02-17 ENCOUNTER — Observation Stay (HOSPITAL_COMMUNITY): Payer: BLUE CROSS/BLUE SHIELD

## 2016-02-17 ENCOUNTER — Encounter (HOSPITAL_COMMUNITY): Payer: Self-pay

## 2016-02-17 DIAGNOSIS — I10 Essential (primary) hypertension: Secondary | ICD-10-CM | POA: Diagnosis not present

## 2016-02-17 DIAGNOSIS — G4733 Obstructive sleep apnea (adult) (pediatric): Secondary | ICD-10-CM

## 2016-02-17 DIAGNOSIS — R0789 Other chest pain: Secondary | ICD-10-CM | POA: Diagnosis not present

## 2016-02-17 DIAGNOSIS — E876 Hypokalemia: Secondary | ICD-10-CM

## 2016-02-17 DIAGNOSIS — R079 Chest pain, unspecified: Secondary | ICD-10-CM

## 2016-02-17 DIAGNOSIS — J45909 Unspecified asthma, uncomplicated: Secondary | ICD-10-CM | POA: Diagnosis not present

## 2016-02-17 LAB — BASIC METABOLIC PANEL
ANION GAP: 7 (ref 5–15)
BUN: 8 mg/dL (ref 6–20)
CO2: 26 mmol/L (ref 22–32)
Calcium: 9 mg/dL (ref 8.9–10.3)
Chloride: 105 mmol/L (ref 101–111)
Creatinine, Ser: 0.7 mg/dL (ref 0.44–1.00)
Glucose, Bld: 133 mg/dL — ABNORMAL HIGH (ref 65–99)
POTASSIUM: 3.7 mmol/L (ref 3.5–5.1)
SODIUM: 138 mmol/L (ref 135–145)

## 2016-02-17 LAB — NM MYOCAR MULTI W/SPECT W/WALL MOTION / EF
CHL CUP RESTING HR STRESS: 70 {beats}/min
CSEPPHR: 95 {beats}/min
Estimated workload: 1 METS
Exercise duration (min): 5 min
Exercise duration (sec): 14 s

## 2016-02-17 LAB — TROPONIN I
Troponin I: <0.03 ng/mL (ref <0.03)
Troponin I: <0.03 ng/mL (ref <0.03)

## 2016-02-17 LAB — MRSA PCR SCREENING: MRSA BY PCR: NEGATIVE

## 2016-02-17 LAB — GLUCOSE, CAPILLARY
GLUCOSE-CAPILLARY: 94 mg/dL (ref 65–99)
GLUCOSE-CAPILLARY: 115 mg/dL — AB (ref 65–99)
GLUCOSE-CAPILLARY: 138 mg/dL — AB (ref 65–99)
Glucose-Capillary: 117 mg/dL — ABNORMAL HIGH (ref 65–99)

## 2016-02-17 MED ORDER — TECHNETIUM TC 99M TETROFOSMIN IV KIT
10.0000 | PACK | Freq: Once | INTRAVENOUS | Status: AC | PRN
  Administered 2016-02-17: 10 via INTRAVENOUS

## 2016-02-17 MED ORDER — ZOLPIDEM TARTRATE 5 MG PO TABS: 5.0000 mg | ORAL_TABLET | Freq: Every evening | ORAL | Status: DC | PRN

## 2016-02-17 MED ORDER — METOPROLOL SUCCINATE ER 25 MG PO TB24
200.0000 mg | ORAL_TABLET | Freq: Every evening | ORAL | Status: DC
  Administered 2016-02-17: 200 mg via ORAL
  Filled 2016-02-17: qty 8

## 2016-02-17 MED ORDER — SERTRALINE HCL 50 MG PO TABS
75.0000 mg | ORAL_TABLET | Freq: Every day | ORAL | Status: DC
  Administered 2016-02-17 – 2016-02-18 (×2): 75 mg via ORAL
  Filled 2016-02-17 (×2): qty 2

## 2016-02-17 MED ORDER — MORPHINE SULFATE (PF) 2 MG/ML IV SOLN
1.0000 mg | INTRAVENOUS | Status: DC | PRN
  Administered 2016-02-17: 1 mg via INTRAVENOUS
  Filled 2016-02-17: qty 1

## 2016-02-17 MED ORDER — GI COCKTAIL ~~LOC~~: 30.0000 mL | Freq: Four times a day (QID) | ORAL | Status: DC | PRN

## 2016-02-17 MED ORDER — ENOXAPARIN SODIUM 40 MG/0.4ML ~~LOC~~ SOLN
40.0000 mg | SUBCUTANEOUS | Status: DC
  Administered 2016-02-17 – 2016-02-18 (×2): 40 mg via SUBCUTANEOUS
  Filled 2016-02-17 (×2): qty 0.4

## 2016-02-17 MED ORDER — PANTOPRAZOLE SODIUM 40 MG PO TBEC
40.0000 mg | DELAYED_RELEASE_TABLET | Freq: Every day | ORAL | Status: DC
  Administered 2016-02-17 – 2016-02-18 (×2): 40 mg via ORAL
  Filled 2016-02-17 (×2): qty 1

## 2016-02-17 MED ORDER — INSULIN ASPART 100 UNIT/ML ~~LOC~~ SOLN
0.0000 [IU] | Freq: Three times a day (TID) | SUBCUTANEOUS | Status: DC
  Administered 2016-02-18: 1 [IU] via SUBCUTANEOUS

## 2016-02-17 MED ORDER — ONDANSETRON HCL 4 MG/2ML IJ SOLN: 4.0000 mg | Freq: Four times a day (QID) | INTRAMUSCULAR | Status: DC | PRN

## 2016-02-17 MED ORDER — ACETAMINOPHEN 325 MG PO TABS: 650.0000 mg | ORAL_TABLET | ORAL | Status: DC | PRN

## 2016-02-17 MED ORDER — ASPIRIN 81 MG PO CHEW
81.0000 mg | CHEWABLE_TABLET | Freq: Every evening | ORAL | Status: DC
  Administered 2016-02-17: 81 mg via ORAL
  Filled 2016-02-17: qty 1

## 2016-02-17 MED ORDER — AMLODIPINE BESYLATE 5 MG PO TABS
10.0000 mg | ORAL_TABLET | Freq: Every day | ORAL | Status: DC
  Administered 2016-02-17 – 2016-02-18 (×2): 10 mg via ORAL
  Filled 2016-02-17 (×2): qty 2

## 2016-02-17 MED ORDER — REGADENOSON 0.4 MG/5ML IV SOLN
0.4000 mg | Freq: Once | INTRAVENOUS | Status: AC
  Administered 2016-02-17: 0.4 mg via INTRAVENOUS
  Filled 2016-02-17: qty 5

## 2016-02-17 MED ORDER — TECHNETIUM TC 99M TETROFOSMIN IV KIT
30.0000 | PACK | Freq: Once | INTRAVENOUS | Status: AC | PRN
  Administered 2016-02-17: 30 via INTRAVENOUS

## 2016-02-17 MED ORDER — POLYVINYL ALCOHOL 1.4 % OP SOLN
1.0000 [drp] | Freq: Three times a day (TID) | OPHTHALMIC | Status: DC | PRN
Start: 2016-02-17 — End: 2016-02-18
  Filled 2016-02-17: qty 15

## 2016-02-17 MED ORDER — MORPHINE SULFATE (PF) 2 MG/ML IV SOLN
2.0000 mg | INTRAVENOUS | Status: DC | PRN
  Administered 2016-02-17: 2 mg via INTRAVENOUS
  Filled 2016-02-17: qty 1

## 2016-02-17 MED ORDER — POTASSIUM CHLORIDE CRYS ER 20 MEQ PO TBCR
20.0000 meq | EXTENDED_RELEASE_TABLET | ORAL | Status: AC
Start: 2016-02-17 — End: 2016-02-17
  Administered 2016-02-17: 20 meq via ORAL
  Filled 2016-02-17: qty 1

## 2016-02-17 NOTE — Progress Notes (Signed)
Patient states that she will place CPAP on herself. Humidification chamber filled and RT will monitor as needed.

## 2016-02-17 NOTE — Progress Notes (Signed)
Patient presented for Lexiscan. Tolerated procedure well. Pending final stress imaging result.  

## 2016-02-17 NOTE — Progress Notes (Signed)
PROGRESS NOTE    [name]  [id number] DOB: [birth year] DOA: 02/16/2016 PCP: Jonathon Bellows, MD    Brief Narrative:  [name] is a 50 y.o. female with medical history significant of HTN  DM type II,  morbid obesity, OSA on CPAP; presents with complaints of chest pain. Symptoms started last night around 7 PM while the patient was taking a shower. Pain was located on her left side of her chest and states that it was like she chronic cramp in her chest and felt like a "Charlie horse". Initially thought symptoms were caused by gas and she tried massaging the area without relief of symptoms. Also reports trying taking Tums, drinking ginger and burping without relief of symptoms. Pain progressively worsened and became more sharp in nature rating the pain at its peak a 8 out of 10. The pain did not radiate anywhere. Denies having any difficulty breathing, loss of consciousness, recent trauma/ strenuous activity, or diaphoresis. She is followed by Dr. Radford Pax of cardiology and reports having had a stress test many years ago that was negative. Her last echocardiogram was in 2014 with EF of 56.1%. Due to the pain worsening her husband called EMS and she was given 324 mg of aspirin and a nitroglycerin which helped relieve pain symptoms to a 6 out of 10. Patient states that she is been on phentermine for the last 3-4 months, and did not previously have any complications.    Assessment & Plan:   Active Problems:   Hypertension   OSA (obstructive sleep apnea)   Chest pain   Hypokalemia   Chest pain: Acute. Heart score = 5. Patient sees Dr. Radford Pax of cardiology as an outpatient. -EKG similar to prior EKG.  - Check echocardiogram - Held the phentermine -troponin negative.  -stres test intermediate risk, await cardiology recommendations.  Still with chest pain, morphine PRN  Essential hypertension - Continue amlodipine   Hypokalemia - resolved.   Diabetes  mellitus type 2 - Continue to Hold the metformin and Amaryl - Hypoglycemic protocols - Sensitive sliding scale insulin q meals  Weight loss  OSA on CPAP - CPAP per RT     DVT prophylaxis: Lovenox.  Code Status: full code.  Family Communication: husband at bedside.  Disposition Plan: home at time of discharge    Consultants:   Cardiology   Procedures:   Stress test;   Antimicrobials:  none   Subjective: Still complaining of chest pain, intermittent   Objective: Vitals:   02/17/16 0115 02/17/16 0218 02/17/16 0438 02/17/16 0749  BP: 147/80 (!) 151/89 139/83   Pulse: 60 (!) 50 (!) 52   Resp: 12 18 10    Temp:  98.6 F (37 C) 97.8 F (36.6 C) 97.9 F (36.6 C)  TempSrc:  Oral Axillary Axillary  SpO2: 98% 96% 96% 97%  Weight:  114.3 kg (251 lb 15.8 oz) 114.7 kg (252 lb 14.4 oz)   Height:  5\' 6"  (1.676 m)     No intake or output data in the 24 hours ending 02/17/16 0852 Filed Weights   02/16/16 2134 02/17/16 0218 02/17/16 0438  Weight: 113.9 kg (251 lb) 114.3 kg (251 lb 15.8 oz) 114.7 kg (252 lb 14.4 oz)    Examination:  General exam: Appears calm and comfortable  Respiratory system: Clear to auscultation. Respiratory effort normal. Cardiovascular system: S1 & S2 heard, RRR. No JVD, murmurs, rubs, gallops or clicks. No pedal edema. Gastrointestinal system: Abdomen is nondistended, soft and nontender.  No organomegaly or masses felt. Normal bowel sounds heard. Central nervous system: Alert and oriented. No focal neurological deficits. Extremities: Symmetric 5 x 5 power. Skin: No rashes, lesions or ulcers Psychiatry: Judgement and insight appear normal. Mood & affect appropriate.     Data Reviewed: I have personally reviewed following labs and imaging studies  CBC:  Recent Labs Lab 02/16/16 2135  WBC 7.2  HGB 12.7  HCT 39.2  MCV 87.1  PLT 99991111   Basic Metabolic Panel:  Recent Labs Lab 02/16/16 2135  NA 137  K 3.4*  CL 105  CO2 25  GLUCOSE  99  BUN 10  CREATININE 0.53  CALCIUM 9.2   GFR: Estimated Creatinine Clearance (by C-G formula based on SCr of 0.8 mg/dL) Female: 109.4 mL/min Female: 133 mL/min Liver Function Tests: No results for input(s): AST, ALT, ALKPHOS, BILITOT, PROT, ALBUMIN in the last 168 hours. No results for input(s): LIPASE, AMYLASE in the last 168 hours. No results for input(s): AMMONIA in the last 168 hours. Coagulation Profile: No results for input(s): INR, PROTIME in the last 168 hours. Cardiac Enzymes:  Recent Labs Lab 02/17/16 0257 02/17/16 0606  TROPONINI <0.03 <0.03   BNP (last 3 results) No results for input(s): PROBNP in the last 8760 hours. HbA1C: No results for input(s): HGBA1C in the last 72 hours. CBG:  Recent Labs Lab 02/17/16 0829  GLUCAP 94   Lipid Profile: No results for input(s): CHOL, HDL, LDLCALC, TRIG, CHOLHDL, LDLDIRECT in the last 72 hours. Thyroid Function Tests: No results for input(s): TSH, T4TOTAL, FREET4, T3FREE, THYROIDAB in the last 72 hours. Anemia Panel: No results for input(s): VITAMINB12, FOLATE, FERRITIN, TIBC, IRON, RETICCTPCT in the last 72 hours. Sepsis Labs: No results for input(s): PROCALCITON, LATICACIDVEN in the last 168 hours.  Recent Results (from the past 240 hour(s))  MRSA PCR Screening     Status: None   Collection Time: 02/17/16  2:22 AM  Result Value Ref Range Status   MRSA by PCR NEGATIVE NEGATIVE Final    Comment:        The GeneXpert MRSA Assay (FDA approved for NASAL specimens only), is one component of a comprehensive MRSA colonization surveillance program. It is not intended to diagnose MRSA infection nor to guide or monitor treatment for MRSA infections.          Radiology Studies: Dg Chest 2 View  Result Date: 02/16/2016 CLINICAL DATA:  Initial evaluation for acute left-sided chest pain. EXAM: CHEST  2 VIEW COMPARISON:  Prior radiograph from 01/03/2013. FINDINGS: Mild cardiomegaly is stable. Mediastinal silhouette  within normal limits. Tortuosity the intrathoracic aorta noted. The lungs are normally inflated. No airspace consolidation, pleural effusion, or pulmonary edema is identified. There is no pneumothorax. No acute osseous abnormality identified. Electrode related the spinal stimulator device noted, stable. IMPRESSION: 1. No active cardiopulmonary disease. 2. Stable mild cardiomegaly without pulmonary edema. Electronically Signed   By: Jeannine Boga M.D.   On: 02/16/2016 22:19        Scheduled Meds: . amLODipine  10 mg Oral Daily  . aspirin  81 mg Oral QPM  . enoxaparin (LOVENOX) injection  40 mg Subcutaneous Q24H  . insulin aspart  0-9 Units Subcutaneous TID WC  . metoprolol  200 mg Oral QPM  . sertraline  75 mg Oral Daily   Continuous Infusions:    LOS: 0 days    Time spent: 35 minutes     Elmarie Shiley, MD [facility] Pager [phone]  If 7PM-7AM,  please contact night-coverage [email or web address] Password [email or web address] 02/17/2016, 8:52 AM

## 2016-02-17 NOTE — Progress Notes (Signed)
Pt c/o of 3 out of 10 non-radiating lt sided cp described as aching. Pt given 1 SL nitro & is currently in no pain. Cardiology PA at bedside. Will continue to monitor the pt. Hoover Brunette, RN

## 2016-02-17 NOTE — Consult Note (Signed)
Patient ID: [name] MRN: [id number], DOB/AGE: 50-11-1965   Admit date: 02/16/2016   Reason for Consult: Chest Pain Requesting MD: Dr. Fuller Plan, Internal Medicine   Primary Physician: Jonathon Bellows, MD Primary Cardiologist: Dr. Radford Pax (last OV in 2014)  Pt. Profile:  50 y/o female with no prior cardiac history, but multiple risk factors including HTN, T2DM, prior tobacco history, family h/o CAD and personal history of obesity as well as a h/o OSA and asthma, admitted for chest pain observation.   Problem List  Past Medical History:  Diagnosis Date  . Asthma   . Diabetes mellitus without complication ([place])   . Fatty liver   . Hypertension   . Morbid obesity with BMI of 40.0-44.9, adult ([place])   . OSA (obstructive sleep apnea)    mild with AHI 11/hr now on CPAP at 20cm H2O    Past Surgical History:  Procedure Laterality Date  . ABDOMINAL HYSTERECTOMY    . BACK SURGERY     l4-5  . CARPAL TUNNEL RELEASE     right  . SHOULDER SURGERY    . SPINAL CORD STIMULATOR IMPLANT       Allergies  No Known Allergies  HPI  50 y/o female with no prior cardiac history, but multiple risk factors including HTN, T2DM, prior tobacco history, family h/o CAD and personal history of obesity as well as a h/o OSA and asthma, admitted for chest pain observation.   She has been followed in the past by Dr. Radford Pax. Last OV was in 2014. She had a NST in 2011 that was low risk but did show breat attenuation. 2D echo in 2014 showed normal LVEF, estimated at 56%.   She was in her usual state of health until yesterday around 7:30 PM last night when she developed substernal, cramping discomfort while taking a shower. She did not eat a meal prior to development of SSCP. No other associated symptoms. It did not radiate. No exacerbating factors. She tried TUMs, ginger ale and belching w/o any relief.  She has never had pain like this before. No previous history of chest pain, and no  history of exertional CP. She dose admit to occasional exertional dyspnea with moderate to heavy activity, which she attributes to being overweight/ deconditioned.  On arrival to ED, she was still with CP. Relieved some with morphine, but lingered on throughout the evening and into the mid morning hours. She has recurrent pain around 8:30 am and was given SL NGT which relieved the pain. Currently CP free.   Cardiac enzymes are negative x 3. EKG shows NSR with ? Atypical RBBB. CXR shows stable mild cardiomegaly w/o edema. No active cardiopulmonary disease. BMP shows slight hypokalemia with K of 3.4. No other notable lab findings. SCr/BUN normal at 0.53/10.   Home Medications  Prior to Admission medications   Medication Sig Start Date End Date Taking? Authorizing Provider  amLODipine (NORVASC) 10 MG tablet Take 10 mg by mouth every morning.  04/11/14  Yes Historical Provider, MD  aspirin 81 MG tablet Take 81 mg by mouth every evening. chewable   Yes Historical Provider, MD  Aspirin-Acetaminophen-Caffeine (GOODY HEADACHE PO) Take 1 Package by mouth as needed (headache).   Yes Historical Provider, MD  Calcium Carbonate-Vitamin D (CALCIUM 600 + D PO) Take 2 tablets by mouth every morning.    Yes Historical Provider, MD  carboxymethylcellulose (REFRESH PLUS) 0.5 % SOLN Place 1 drop into both eyes 3 (three) times daily  as needed (dry eyes).   Yes Historical Provider, MD  Eszopiclone 3 MG TABS Take 3 mg by mouth at bedtime as needed (sleep). Take immediately before bedtime    Yes Historical Provider, MD  glimepiride (AMARYL) 2 MG tablet Take 4 mg by mouth daily before breakfast.    Yes Historical Provider, MD  hydrochlorothiazide (HYDRODIURIL) 25 MG tablet Take 25 mg by mouth daily as needed (fluid).    Yes Historical Provider, MD  metFORMIN (GLUCOPHAGE-XR) 500 MG 24 hr tablet Take 2,000 mg by mouth daily with supper.   Yes Historical Provider, MD  metoprolol (TOPROL-XL) 200 MG 24 hr tablet Take 200 mg by  mouth every evening.    Yes Historical Provider, MD  NONFORMULARY OR COMPOUNDED [place] compound:  Onychomycosis Nail Lacquer - fluconazole 2%, Terbinafine 1%, DMSO, apply to affected area daily. 01/10/16  Yes Trula Slade, DPM  phentermine 30 MG capsule Take 30 mg by mouth every morning.   Yes Historical Provider, MD  sertraline (ZOLOFT) 50 MG tablet Take 75 mg by mouth every morning.   Yes Historical Provider, MD    Family History  Family History  Problem Relation Age of Onset  . Hypertension Mother   . Diabetes Mother   . Lymphoma Mother   . Breast cancer Mother   . Hypertension Father   . Diabetes Father     Social History  Social History   Social History  . Marital status: Married    Spouse name: N/A  . Number of children: N/A  . Years of education: N/A   Occupational History  . Not on file.   Social History Main Topics  . Smoking status: Former Research scientist (life sciences)  . Smokeless tobacco: Never Used  . Alcohol use Yes     Comment: socially   . Drug use: No  . Sexual activity: Not on file   Other Topics Concern  . Not on file   Social History Narrative  . No narrative on file     Review of Systems General:  No chills, fever, night sweats or weight changes.  Cardiovascular: + chest pain, +dyspnea on exertion, no edema, orthopnea, palpitations, paroxysmal nocturnal dyspnea. Dermatological: No rash, lesions/masses Respiratory: No cough, dyspnea Urologic: No hematuria, dysuria Abdominal:   No nausea, vomiting, diarrhea, bright red blood per rectum, melena, or hematemesis Neurologic:  No visual changes, wkns, changes in mental status. All other systems reviewed and are otherwise negative except as noted above.  Physical Exam  Blood pressure 139/83, pulse (!) 52, temperature 97.9 F (36.6 C), temperature source Axillary, resp. rate 10, height 5\' 6"  (1.676 m), weight 252 lb 14.4 oz (114.7 kg), SpO2 97 %.  General: Pleasant, NAD, moderately obese  Psych:  Normal affect. Neuro: Alert and oriented X 3. Moves all extremities spontaneously. HEENT: Normal  Neck: Supple without bruits or JVD. Lungs:  Resp regular and unlabored, CTA. Heart: RRR no s3, s4, or murmurs. Abdomen: Soft, non-tender, non-distended, BS + x 4.  Extremities: No clubbing, cyanosis or edema. DP/PT/Radials 2+ and equal bilaterally.  Labs  Troponin [facility] Test)  Recent Labs  02/16/16 2148  TROPIPOC 0.00    Recent Labs  02/17/16 0257 02/17/16 0606  TROPONINI <0.03 <0.03   Lab Results  Component Value Date   WBC 7.2 02/16/2016   HGB 12.7 02/16/2016   HCT 39.2 02/16/2016   MCV 87.1 02/16/2016   PLT 262 02/16/2016    Recent Labs Lab 02/16/16 2135  NA 137  K 3.4*  CL 105  CO2 25  BUN 10  CREATININE 0.53  CALCIUM 9.2  GLUCOSE 99   No results found for: CHOL, HDL, LDLCALC, TRIG No results found for: DDIMER   Radiology/Studies  Dg Chest 2 View  Result Date: 02/16/2016 CLINICAL DATA:  Initial evaluation for acute left-sided chest pain. EXAM: CHEST  2 VIEW COMPARISON:  Prior radiograph from 01/03/2013. FINDINGS: Mild cardiomegaly is stable. Mediastinal silhouette within normal limits. Tortuosity the intrathoracic aorta noted. The lungs are normally inflated. No airspace consolidation, pleural effusion, or pulmonary edema is identified. There is no pneumothorax. No acute osseous abnormality identified. Electrode related the spinal stimulator device noted, stable. IMPRESSION: 1. No active cardiopulmonary disease. 2. Stable mild cardiomegaly without pulmonary edema. Electronically Signed   By: Jeannine Boga M.D.   On: 02/16/2016 22:19    ECG  NSR, ? Atypical RBBB   Echocardiogram - pending   ASSESSMENT AND PLAN  Active Problems:   Hypertension   OSA (obstructive sleep apnea)   Chest pain   Hypokalemia   1. Chest Pain at Rest: patient with multiple risk factors: T2DM, HTN, remote tobacco use, family history (father with MI in his 37s)  + obesity. She had resting SSCP described and a crushing/ cramping sensation unrelieved with antacids. EKG and enzymes unremarkable. She is currently CP free. Recommend NST to assess for coronary ischemia, however she did have a glass of orange juice today but states she did not eat anything on her food tray.  Will discuss with MD timing of stress test. May need to complete as an outpatient. Keep NPO until decision is reached by MD. Continue with plans for 2D echo today.    Signed, Lyda Jester, PA-C 02/17/2016, 7:56 AM  The patient was seen and examined, and I agree with the history, physical exam, assessment and plan as documented above which has been discussed with B. Simmons, with modifications as noted below. Pt with multiple CV risk factors admitted with chest pain. Unremarkable ECG and troponins. Will plan for Lexiscan Myoview stress test today and echocardiogram for further evaluation. Pt in agreement with plan.  Kate Sable, MD, [facility]  02/17/2016 9:47 AM

## 2016-02-17 NOTE — Progress Notes (Signed)
Discussed with Dr. Harl Bowie. Stress test with intermediate risk. No reversible ischemia or infraction.  Moderate left ventricular dilatation with lateral wall. EF of 49%.   Will need echocardiogram prior to discharge.

## 2016-02-17 NOTE — H&P (Signed)
History and Physical    [name] [id number] DOB: [birth year] DOA: 02/16/2016  Referring MD/NP/PA: Dr. Ellender Hose PCP: Jonathon Bellows, MD  Patient coming from: Home  Chief Complaint: Chest pain  HPI: [name] is a 50 y.o. female with medical history significant of HTN  DM type II,  morbid obesity, OSA on CPAP; presents with complaints of chest pain. Symptoms started last night around 7 PM while the patient was taking a shower. Pain was located on her left side of her chest and states that it was like she chronic cramp in her chest and felt like a "Charlie horse". Initially thought symptoms were caused by gas and she tried massaging the area without relief of symptoms. Also reports trying taking Tums, drinking ginger ale, and burping without relief of symptoms. Pain progressively worsened and became more sharp in nature rating the pain at its peak a 8 out of 10. The pain did not radiate anywhere. Denies having any difficulty breathing, loss of consciousness, recent trauma/ strenuous activity, or diaphoresis. She is followed by Dr. Radford Pax of cardiology and reports having had a stress test many years ago that was negative. Her last echocardiogram was in 2014 with EF of 56.1%. Due to the pain worsening her husband called EMS and she was given 324 mg of aspirin and a nitroglycerin which helped relieve pain symptoms to a 6 out of 10. Patient states that she is been on phentermine for the last 3-4 months, and did not previously have any complications.   ED Course:  Initial workup in the emergency department showed no significant signs of ischemia on EKG and initial troponins were negative. Lab work was relatively unremarkable except for potassium of 3.4. [facility] called to admit for chest pain rule out. She was given additional nitroglycerin and morphine which helped give some relief to pain symptoms. Pain  is currently rated to be a 2 out of 10.  Review of Systems: As per HPI  otherwise 10 point review of systems negative.   Past Medical History:  Diagnosis Date  . Asthma   . Diabetes mellitus without complication ([place])   . Fatty liver   . Hypertension   . Morbid obesity with BMI of 40.0-44.9, adult ([place])   . OSA (obstructive sleep apnea)    mild with AHI 11/hr now on CPAP at 20cm H2O    Past Surgical History:  Procedure Laterality Date  . ABDOMINAL HYSTERECTOMY    . BACK SURGERY     l4-5  . CARPAL TUNNEL RELEASE     right  . SHOULDER SURGERY    . SPINAL CORD STIMULATOR IMPLANT       reports that she has quit smoking. She has never used smokeless tobacco. She reports that she drinks alcohol. She reports that she does not use drugs.  No Known Allergies  Family History  Problem Relation Age of Onset  . Hypertension Mother   . Diabetes Mother   . Lymphoma Mother   . Breast cancer Mother   . Hypertension Father   . Diabetes Father     Prior to Admission medications   Medication Sig Start Date End Date Taking? Authorizing Provider  amLODipine (NORVASC) 10 MG tablet Take 10 mg by mouth every morning.  04/11/14  Yes Historical Provider, MD  aspirin 81 MG tablet Take 81 mg by mouth every evening. chewable   Yes Historical Provider, MD  Aspirin-Acetaminophen-Caffeine (GOODY HEADACHE PO) Take 1 Package by mouth as needed (headache).  Yes Historical Provider, MD  Calcium Carbonate-Vitamin D (CALCIUM 600 + D PO) Take 2 tablets by mouth every morning.    Yes Historical Provider, MD  carboxymethylcellulose (REFRESH PLUS) 0.5 % SOLN Place 1 drop into both eyes 3 (three) times daily as needed (dry eyes).   Yes Historical Provider, MD  Eszopiclone 3 MG TABS Take 3 mg by mouth at bedtime as needed (sleep). Take immediately before bedtime    Yes Historical Provider, MD  glimepiride (AMARYL) 2 MG tablet Take 4 mg by mouth daily before breakfast.    Yes Historical Provider, MD  hydrochlorothiazide (HYDRODIURIL) 25 MG tablet Take 25 mg by mouth daily as needed  (fluid).    Yes Historical Provider, MD  metFORMIN (GLUCOPHAGE-XR) 500 MG 24 hr tablet Take 2,000 mg by mouth daily with supper.   Yes Historical Provider, MD  metoprolol (TOPROL-XL) 200 MG 24 hr tablet Take 200 mg by mouth every evening.    Yes Historical Provider, MD  NONFORMULARY OR COMPOUNDED [place] compound:  Onychomycosis Nail Lacquer - fluconazole 2%, Terbinafine 1%, DMSO, apply to affected area daily. 01/10/16  Yes Trula Slade, DPM  phentermine 30 MG capsule Take 30 mg by mouth every morning.   Yes Historical Provider, MD  sertraline (ZOLOFT) 50 MG tablet Take 75 mg by mouth every morning.   Yes Historical Provider, MD    Physical Exam:    Constitutional: NAD, calm, comfortable Vitals:   02/16/16 2301 02/16/16 2311 02/16/16 2330 02/17/16 0015  BP: 138/91 138/83 132/84 128/81  Pulse: 74 64 (!) 59 60  Resp: 18 18 18 18   Temp:      TempSrc:      SpO2: 96% 96% 96% 94%  Weight:      Height:       Eyes: PERRL, lids and conjunctivae normal ENMT: Mucous membranes are moist. Posterior pharynx clear of any exudate or lesions.Normal dentition.  Neck: normal, supple, no masses, no thyromegaly Respiratory: clear to auscultation bilaterally, no wheezing, no crackles. Normal respiratory effort. No accessory muscle use.  Cardiovascular: Regular rate and rhythm, no murmurs / rubs / gallops. No extremity edema. 2+ pedal pulses. No carotid bruits.  Abdomen: no tenderness, no masses palpated. No hepatosplenomegaly. Bowel sounds positive.  Musculoskeletal: no clubbing / cyanosis. No joint deformity upper and lower extremities. Good ROM, no contractures. Normal muscle tone.  Skin: no rashes, lesions, ulcers. No induration Neurologic: CN 2-12 grossly intact. Sensation intact, DTR normal. Strength 5/5 in all 4.  Psychiatric: Normal judgment and insight. Alert and oriented x 3. Normal mood.     Labs on Admission: I have personally reviewed following labs and imaging  studies  CBC:  Recent Labs Lab 02/16/16 2135  WBC 7.2  HGB 12.7  HCT 39.2  MCV 87.1  PLT 99991111   Basic Metabolic Panel:  Recent Labs Lab 02/16/16 2135  NA 137  K 3.4*  CL 105  CO2 25  GLUCOSE 99  BUN 10  CREATININE 0.53  CALCIUM 9.2   GFR: Estimated Creatinine Clearance (by C-G formula based on SCr of 0.8 mg/dL) Female: 108.9 mL/min Female: 132.4 mL/min Liver Function Tests: No results for input(s): AST, ALT, ALKPHOS, BILITOT, PROT, ALBUMIN in the last 168 hours. No results for input(s): LIPASE, AMYLASE in the last 168 hours. No results for input(s): AMMONIA in the last 168 hours. Coagulation Profile: No results for input(s): INR, PROTIME in the last 168 hours. Cardiac Enzymes: No results for input(s): CKTOTAL, CKMB, CKMBINDEX, TROPONINI in  the last 168 hours. BNP (last 3 results) No results for input(s): PROBNP in the last 8760 hours. HbA1C: No results for input(s): HGBA1C in the last 72 hours. CBG: No results for input(s): GLUCAP in the last 168 hours. Lipid Profile: No results for input(s): CHOL, HDL, LDLCALC, TRIG, CHOLHDL, LDLDIRECT in the last 72 hours. Thyroid Function Tests: No results for input(s): TSH, T4TOTAL, FREET4, T3FREE, THYROIDAB in the last 72 hours. Anemia Panel: No results for input(s): VITAMINB12, FOLATE, FERRITIN, TIBC, IRON, RETICCTPCT in the last 72 hours. Urine analysis:    Component Value Date/Time   COLORURINE YELLOW 01/03/2013 1703   APPEARANCEUR CLEAR 01/03/2013 1703   LABSPEC 1.011 01/03/2013 1703   PHURINE 5.5 01/03/2013 1703   GLUCOSEU NEGATIVE 01/03/2013 1703   HGBUR NEGATIVE 01/03/2013 1703   BILIRUBINUR NEGATIVE 01/03/2013 1703   KETONESUR NEGATIVE 01/03/2013 1703   PROTEINUR NEGATIVE 01/03/2013 1703   UROBILINOGEN 0.2 01/03/2013 1703   NITRITE NEGATIVE 01/03/2013 1703   LEUKOCYTESUR NEGATIVE 01/03/2013 1703   Sepsis Labs: No results found for this or any previous visit (from the past 240 hour(s)).   Radiological  Exams on Admission: Dg Chest 2 View  Result Date: 02/16/2016 CLINICAL DATA:  Initial evaluation for acute left-sided chest pain. EXAM: CHEST  2 VIEW COMPARISON:  Prior radiograph from 01/03/2013. FINDINGS: Mild cardiomegaly is stable. Mediastinal silhouette within normal limits. Tortuosity the intrathoracic aorta noted. The lungs are normally inflated. No airspace consolidation, pleural effusion, or pulmonary edema is identified. There is no pneumothorax. No acute osseous abnormality identified. Electrode related the spinal stimulator device noted, stable. IMPRESSION: 1. No active cardiopulmonary disease. 2. Stable mild cardiomegaly without pulmonary edema. Electronically Signed   By: Jeannine Boga M.D.   On: 02/16/2016 22:19    EKG: Independently reviewed. Sinus rhythm with RBBB  Assessment/Plan Chest pain: Acute. Heart score = 5. Patient sees Dr. Radford Pax of cardiology as an outpatient. - Admit to telemetry bed - Trend cardiac enzymes - Check echocardiogram - Held the phentermine - Will need to consult cards in am  Essential hypertension - Continue amlodipine   Hypokalemia - Give 20 mEq of potassium chloride 1 dose now - Continue to monitor and replace as needed  Diabetes mellitus type 2 - Held the metformin and Amaryl - Hypoglycemic protocols - Sensitive sliding scale insulin q meals  Weight loss  OSA on CPAP - CPAP per RT    DVT prophylaxis: Lovenox  Code Status: Full  Family Communication: Discussed overall plan with the patient and husband at  Disposition Plan: Possible discharge home if workup negative Consults called:  Admission status: Telemetry observation  Norval Morton MD [facility] Pager [phone]  If 7PM-7AM, please contact night-coverage [email or web address] Password [email or web address]  02/17/2016, 12:41 AM

## 2016-02-17 NOTE — Progress Notes (Signed)
Placed patient on CPAP with no complications. Patient is tolerating it well and RT will continue to monitor as needed.

## 2016-02-18 ENCOUNTER — Observation Stay (HOSPITAL_BASED_OUTPATIENT_CLINIC_OR_DEPARTMENT_OTHER): Payer: BLUE CROSS/BLUE SHIELD

## 2016-02-18 DIAGNOSIS — R079 Chest pain, unspecified: Secondary | ICD-10-CM | POA: Diagnosis not present

## 2016-02-18 DIAGNOSIS — R0789 Other chest pain: Secondary | ICD-10-CM | POA: Diagnosis not present

## 2016-02-18 LAB — D-DIMER, QUANTITATIVE: D-Dimer, Quant: 0.36 ug/mL-FEU (ref 0.00–0.50)

## 2016-02-18 LAB — ECHOCARDIOGRAM COMPLETE
Height: 66 in
Weight: 4009.6 oz

## 2016-02-18 LAB — GLUCOSE, CAPILLARY
GLUCOSE-CAPILLARY: 115 mg/dL — AB (ref 65–99)
GLUCOSE-CAPILLARY: 131 mg/dL — AB (ref 65–99)

## 2016-02-18 MED ORDER — TRAMADOL HCL 50 MG PO TABS: 50.0000 mg | ORAL_TABLET | Freq: Four times a day (QID) | ORAL | 0 refills | Status: DC | PRN

## 2016-02-18 MED ORDER — PANTOPRAZOLE SODIUM 40 MG PO TBEC: 40.0000 mg | DELAYED_RELEASE_TABLET | Freq: Every day | ORAL | 0 refills | Status: DC

## 2016-02-18 NOTE — Discharge Summary (Signed)
Physician Discharge Summary  [name] [id number] DOB: 08-Feb-1966 DOA: 02/16/2016  PCP: Jonathon Bellows, MD  Admit date: 02/16/2016 Discharge date: 02/18/2016  Admitted From: Home  Disposition:  Home   Recommendations for Outpatient Follow-up:  1. Follow up with PCP in 1-2 weeks 2. Please obtain BMP/CBC in one week 3. Follow up with PCP for further evaluation of pain if persist     Discharge Condition: Stable.  CODE STATUS: Full code.  Diet recommendation: Heart Healthy  Brief/Interim Summary:  Brief Narrative: [name] a 50 y.o.femalewith medical history significant ofHTN DM type II, morbid obesity, OSA on CPAP; presents with complaints of chest pain. Symptoms started last night around 7 PM while the patient was taking a shower. Pain was located on her left side of her chest and states that it was like she chronic cramp in her chest and felt like a "Charlie horse". Initially thought symptoms were caused by gas and she tried massaging the area without relief of symptoms. Also reports trying taking Tums, drinking ginger and burping without relief of symptoms. Pain progressively worsened and became more sharp in nature rating the pain at its peak a 8 out of 10. The pain did not radiate anywhere. Denies having any difficulty breathing, loss of consciousness, recent trauma/ strenuous activity, or diaphoresis. She is followed by Dr. Radford Pax of cardiology and reports having had a stress test many years ago that was negative. Her last echocardiogram was in 2014with EF of 56.1%. Due to the pain worsening her husband called EMS and she was given 324mg  of aspirin and a nitroglycerin which helped relieve pain symptoms to a 6 out of 10. Patient states that she is been on phentermine for the last 3-4 months,and did not previously haveany complications.    Assessment & Plan:  Chest pain: Acute. Heart score =5. Patient sees Dr. Radford Pax of cardiology as an  outpatient. -EKG similar to prior EKG.  - discontinue  phentermine -troponin negative.  -stres test intermediate risk, no reversible ischemia or infarct, Ef on ECHO with normal EF.  -D dimer negative. Will provide tramadol for pain PRN short course.   Essential hypertension - Continue amlodipine   Hypokalemia - resolved.   Diabetes mellitus type 2 - resume at  metformin and Amaryl - Hypoglycemic protocols - Sensitive sliding scale insulin q meals  Weight loss OSA on CPAP - CPAP per RT     Discharge Diagnoses:  Active Problems:   Hypertension   OSA (obstructive sleep apnea)   Chest pain   Hypokalemia    Discharge Instructions  Discharge Instructions    Diet - low sodium heart healthy    Complete by:  As directed   Increase activity slowly    Complete by:  As directed       Medication List    STOP taking these medications   phentermine 30 MG capsule     TAKE these medications   amLODipine 10 MG tablet Commonly known as:  NORVASC Take 10 mg by mouth every morning.   aspirin 81 MG tablet Take 81 mg by mouth every evening. chewable   CALCIUM 600 + D PO Take 2 tablets by mouth every morning.   carboxymethylcellulose 0.5 % Soln Commonly known as:  REFRESH PLUS Place 1 drop into both eyes 3 (three) times daily as needed (dry eyes).   Eszopiclone 3 MG Tabs Take 3 mg by mouth at bedtime as needed (sleep). Take immediately before bedtime   glimepiride  2 MG tablet Commonly known as:  AMARYL Take 4 mg by mouth daily before breakfast.   GOODY HEADACHE PO Take 1 Package by mouth as needed (headache).   hydrochlorothiazide 25 MG tablet Commonly known as:  HYDRODIURIL Take 25 mg by mouth daily as needed (fluid).   metFORMIN 500 MG 24 hr tablet Commonly known as:  GLUCOPHAGE-XR Take 2,000 mg by mouth daily with supper.   metoprolol 200 MG 24 hr tablet Commonly known as:  TOPROL-XL Take 200 mg by mouth every evening.   NONFORMULARY OR COMPOUNDED  [place] compound:  Onychomycosis Nail Lacquer - fluconazole 2%, Terbinafine 1%, DMSO, apply to affected area daily.   pantoprazole 40 MG tablet Commonly known as:  PROTONIX Take 1 tablet (40 mg total) by mouth daily.   sertraline 50 MG tablet Commonly known as:  ZOLOFT Take 75 mg by mouth every morning.   traMADol 50 MG tablet Commonly known as:  ULTRAM Take 1 tablet (50 mg total) by mouth every 6 (six) hours as needed for moderate pain.       No Known Allergies  Consultations:  Cardiology    Procedures/Studies: Dg Chest 2 View  Result Date: 02/16/2016 CLINICAL DATA:  Initial evaluation for acute left-sided chest pain. EXAM: CHEST  2 VIEW COMPARISON:  Prior radiograph from 01/03/2013. FINDINGS: Mild cardiomegaly is stable. Mediastinal silhouette within normal limits. Tortuosity the intrathoracic aorta noted. The lungs are normally inflated. No airspace consolidation, pleural effusion, or pulmonary edema is identified. There is no pneumothorax. No acute osseous abnormality identified. Electrode related the spinal stimulator device noted, stable. IMPRESSION: 1. No active cardiopulmonary disease. 2. Stable mild cardiomegaly without pulmonary edema. Electronically Signed   By: Jeannine Boga M.D.   On: 02/16/2016 22:19   Nm Myocar Multi W/spect W/wall Motion / Ef  Result Date: 02/17/2016 CLINICAL DATA:  50 year old female with chest pain. EXAM: MYOCARDIAL IMAGING WITH SPECT (REST AND PHARMACOLOGIC-STRESS) GATED LEFT VENTRICULAR WALL MOTION STUDY LEFT VENTRICULAR EJECTION FRACTION TECHNIQUE: Standard myocardial SPECT imaging was performed after resting intravenous injection of 10 mCi Tc-66m tetrofosmin. Subsequently, intravenous infusion of Lexiscan was performed under the supervision of the Cardiology staff. At peak effect of the drug, 30 mCi Tc-36m tetrofosmin was injected intravenously and standard myocardial SPECT imaging was performed. Quantitative gated imaging was  also performed to evaluate left ventricular wall motion, and estimate left ventricular ejection fraction. COMPARISON:  None. FINDINGS: Perfusion: No decreased activity in the left ventricle on stress imaging to suggest reversible ischemia or infarction. Wall Motion: Lateral wall hypokinesis identified. Left Ventricular Ejection Fraction: 49 % End diastolic volume [id number] ml End systolic volume 65 ml IMPRESSION: 1. No reversible ischemia or infarction. 2. Moderate left ventricular dilatation with lateral wall hypokinesis. 3. Left ventricular ejection fraction 49% 4. Non invasive risk stratification*: Intermediate *2012 Appropriate Use Criteria for Coronary Revascularization Focused Update: J Am Coll Cardiol. [id number]. [email or web address]://[email or web address] Electronically Signed   By: Margarette Canada M.D.   On: 02/17/2016 15:10      Subjective: Mild episode of chest pain, also has some chest pain on palpation of chest   Discharge Exam: Vitals:   02/17/16 2351 02/18/16 0516  BP: 129/71 (!) 153/87  Pulse: 71 63  Resp: (!) 21 18  Temp: 97.8 F (36.6 C) 98.1 F (36.7 C)   Vitals:   02/17/16 1953 02/17/16 2010 02/17/16 2351 02/18/16 0516  BP:  (!) 147/93 129/71 (!) 153/87  Pulse: 68 64 71 63  Resp: 15 16 (!) 21 18  Temp:  97.7 F (36.5 C) 97.8 F (36.6 C) 98.1 F (36.7 C)  TempSrc:  Oral Oral Oral  SpO2: 96% 98% 100% 99%  Weight:    113.7 kg (250 lb 9.6 oz)  Height:        General: Pt is alert, awake, not in acute distress Cardiovascular: RRR, S1/S2 +, no rubs, no gallops Respiratory: CTA bilaterally, no wheezing, no rhonchi Abdominal: Soft, NT, ND, bowel sounds + Extremities: no edema, no cyanosis    The results of significant diagnostics from this hospitalization (including imaging, microbiology, ancillary and laboratory) are listed below for reference.     Microbiology: Recent Results (from the past 240 hour(s))  MRSA PCR Screening     Status: None    Collection Time: 02/17/16  2:22 AM  Result Value Ref Range Status   MRSA by PCR NEGATIVE NEGATIVE Final    Comment:        The GeneXpert MRSA Assay (FDA approved for NASAL specimens only), is one component of a comprehensive MRSA colonization surveillance program. It is not intended to diagnose MRSA infection nor to guide or monitor treatment for MRSA infections.      Labs: BNP (last 3 results) No results for input(s): BNP in the last 8760 hours. Basic Metabolic Panel:  Recent Labs Lab 02/16/16 2135 02/17/16 1003  NA 137 138  K 3.4* 3.7  CL 105 105  CO2 25 26  GLUCOSE 99 133*  BUN 10 8  CREATININE 0.53 0.70  CALCIUM 9.2 9.0   Liver Function Tests: No results for input(s): AST, ALT, ALKPHOS, BILITOT, PROT, ALBUMIN in the last 168 hours. No results for input(s): LIPASE, AMYLASE in the last 168 hours. No results for input(s): AMMONIA in the last 168 hours. CBC:  Recent Labs Lab 02/16/16 2135  WBC 7.2  HGB 12.7  HCT 39.2  MCV 87.1  PLT 262   Cardiac Enzymes:  Recent Labs Lab 02/17/16 0257 02/17/16 0606 02/17/16 0957  TROPONINI <0.03 <0.03 <0.03   BNP: Invalid input(s): POCBNP CBG:  Recent Labs Lab 02/17/16 1115 02/17/16 1654 02/17/16 2125 02/18/16 0718 02/18/16 1107  GLUCAP 117* 115* 138* 115* 131*   D-Dimer  Recent Labs  02/18/16 1226  DDIMER 0.36   Hgb A1c No results for input(s): HGBA1C in the last 72 hours. Lipid Profile No results for input(s): CHOL, HDL, LDLCALC, TRIG, CHOLHDL, LDLDIRECT in the last 72 hours. Thyroid function studies No results for input(s): TSH, T4TOTAL, T3FREE, THYROIDAB in the last 72 hours.  Invalid input(s): FREET3 Anemia work up No results for input(s): VITAMINB12, FOLATE, FERRITIN, TIBC, IRON, RETICCTPCT in the last 72 hours. Urinalysis    Component Value Date/Time   COLORURINE YELLOW 01/03/2013 1703   APPEARANCEUR CLEAR 01/03/2013 1703   LABSPEC 1.011 01/03/2013 1703   PHURINE 5.5 01/03/2013 1703    GLUCOSEU NEGATIVE 01/03/2013 1703   HGBUR NEGATIVE 01/03/2013 1703   BILIRUBINUR NEGATIVE 01/03/2013 1703   KETONESUR NEGATIVE 01/03/2013 1703   PROTEINUR NEGATIVE 01/03/2013 1703   UROBILINOGEN 0.2 01/03/2013 1703   NITRITE NEGATIVE 01/03/2013 1703   LEUKOCYTESUR NEGATIVE 01/03/2013 1703   Sepsis Labs Invalid input(s): PROCALCITONIN,  WBC,  LACTICIDVEN Microbiology Recent Results (from the past 240 hour(s))  MRSA PCR Screening     Status: None   Collection Time: 02/17/16  2:22 AM  Result Value Ref Range Status   MRSA by PCR NEGATIVE NEGATIVE Final    Comment:        The GeneXpert MRSA Assay (FDA approved for  NASAL specimens only), is one component of a comprehensive MRSA colonization surveillance program. It is not intended to diagnose MRSA infection nor to guide or monitor treatment for MRSA infections.      Time coordinating discharge: Over 30 minutes  SIGNED:   Elmarie Shiley, MD  [facility] 02/18/2016, 1:25 PM Pager [phone]  If 7PM-7AM, please contact night-coverage [email or web address] Password [email or web address]

## 2016-02-18 NOTE — Progress Notes (Signed)
  Echocardiogram 2D Echocardiogram has been performed.  Aggie Cosier 02/18/2016, 9:37 AM

## 2016-02-18 NOTE — Progress Notes (Signed)
Patient ID: [name], female   DOB: 06/17/1965, 50 y.o.   MRN: [id number]  Echo reviewed Normal  Report in Epic EF 55-60%   Ok to d/c home   [employer]

## 2016-02-18 NOTE — Progress Notes (Signed)
Patient Name: [name] Date of Encounter: 02/18/2016  Hospital Problem List     Active Problems:   Hypertension   OSA (obstructive sleep apnea)   Chest pain   Hypokalemia    Subjective   Feels well this morning, still reports mild chest discomfort, points to the center of her chest.   Inpatient Medications    . amLODipine  10 mg Oral Daily  . aspirin  81 mg Oral QPM  . enoxaparin (LOVENOX) injection  40 mg Subcutaneous Q24H  . insulin aspart  0-9 Units Subcutaneous TID WC  . metoprolol  200 mg Oral QPM  . pantoprazole  40 mg Oral Daily  . sertraline  75 mg Oral Daily    Vital Signs    Vitals:   02/17/16 1953 02/17/16 2010 02/17/16 2351 02/18/16 0516  BP:  (!) 147/93 129/71 (!) 153/87  Pulse: 68 64 71 63  Resp: 15 16 (!) 21 18  Temp:  97.7 F (36.5 C) 97.8 F (36.6 C) 98.1 F (36.7 C)  TempSrc:  Oral Oral Oral  SpO2: 96% 98% 100% 99%  Weight:    250 lb 9.6 oz (113.7 kg)  Height:        Intake/Output Summary (Last 24 hours) at 02/18/16 0821 Last data filed at 02/17/16 2230  Gross per 24 hour  Intake              600 ml  Output                0 ml  Net              600 ml   Filed Weights   02/17/16 0218 02/17/16 0438 02/18/16 0516  Weight: 251 lb 15.8 oz (114.3 kg) 252 lb 14.4 oz (114.7 kg) 250 lb 9.6 oz (113.7 kg)    Physical Exam    General: Pleasant moderately obese AA female, NAD. Neuro: Alert and oriented X 3. Moves all extremities spontaneously. Psych: Normal affect. HEENT:  Normal  Neck: Supple without bruits or JVD. Lungs:  Resp regular and unlabored, CTA. Heart: RRR no s3, s4, or murmurs. Abdomen: Soft, non-tender, non-distended, BS + x 4.  Extremities: No clubbing, cyanosis or edema. DP/PT/Radials 2+ and equal bilaterally.  Labs    CBC  Recent Labs  02/16/16 2135  WBC 7.2  HGB 12.7  HCT 39.2  MCV 87.1  PLT 99991111   Basic Metabolic Panel  Recent Labs  02/16/16 2135 02/17/16 1003  NA 137 138  K 3.4* 3.7  CL  105 105  CO2 25 26  GLUCOSE 99 133*  BUN 10 8  CREATININE 0.53 0.70  CALCIUM 9.2 9.0   Liver Function Tests No results for input(s): AST, ALT, ALKPHOS, BILITOT, PROT, ALBUMIN in the last 72 hours. No results for input(s): LIPASE, AMYLASE in the last 72 hours. Cardiac Enzymes  Recent Labs  02/17/16 0257 02/17/16 0606 02/17/16 0957  TROPONINI <0.03 <0.03 <0.03   BNP Invalid input(s): POCBNP D-Dimer No results for input(s): DDIMER in the last 72 hours. Hemoglobin A1C No results for input(s): HGBA1C in the last 72 hours. Fasting Lipid Panel No results for input(s): CHOL, HDL, LDLCALC, TRIG, CHOLHDL, LDLDIRECT in the last 72 hours. Thyroid Function Tests No results for input(s): TSH, T4TOTAL, T3FREE, THYROIDAB in the last 72 hours.  Invalid input(s): FREET3  Telemetry    SB  ECG    N/A  Radiology    Nm Myocar Multi W/spect W/wall Motion /  Ef  Result Date: 02/17/2016 CLINICAL DATA:  50 year old female with chest pain. EXAM: MYOCARDIAL IMAGING WITH SPECT (REST AND PHARMACOLOGIC-STRESS) GATED LEFT VENTRICULAR WALL MOTION STUDY LEFT VENTRICULAR EJECTION FRACTION TECHNIQUE: Standard myocardial SPECT imaging was performed after resting intravenous injection of 10 mCi Tc-31m tetrofosmin. Subsequently, intravenous infusion of Lexiscan was performed under the supervision of the Cardiology staff. At peak effect of the drug, 30 mCi Tc-63m tetrofosmin was injected intravenously and standard myocardial SPECT imaging was performed. Quantitative gated imaging was also performed to evaluate left ventricular wall motion, and estimate left ventricular ejection fraction. COMPARISON:  None. FINDINGS: Perfusion: No decreased activity in the left ventricle on stress imaging to suggest reversible ischemia or infarction. Wall Motion: Lateral wall hypokinesis identified. Left Ventricular Ejection Fraction: 49 % End diastolic volume [id number] ml End systolic volume 65 ml IMPRESSION: 1. No reversible ischemia  or infarction. 2. Moderate left ventricular dilatation with lateral wall hypokinesis. 3. Left ventricular ejection fraction 49% 4. Non invasive risk stratification*: Intermediate *2012 Appropriate Use Criteria for Coronary Revascularization Focused Update: J Am Coll Cardiol. [id number]. http://content.airportbarriers.com.aspx?articleid=[email or web address] Electronically Signed   By: Margarette Canada M.D.   On: 02/17/2016 15:10    Assessment & Plan    50 y/o female with no prior cardiac history, but multiple risk factors including HTN, T2DM, prior tobacco history, family h/o CAD and personal history of obesity as well as a h/o OSA and asthma, admitted for chest pain observation.   1. Chest pain: Trop cycled neg x3. Underwent a [employer] yesterday, intermediate risk with no reversible ischemia with EF of 49%. Last 2D echo from 2014 showed normal EF of 56%. -- Still pending a 2D echo today.  2. HTN: Generally controlled, continue BB and norvasc. No room for titration of BB given SB.     Signed, Reino Bellis NP-C Pager [phone]  Patient examined chart reviewed. No pain r/o myovue non ischemic echo just done Will read and if EF normal can go home. Exam benign except for obesity  Jenkins Rouge

## 2016-02-21 ENCOUNTER — Ambulatory Visit: Payer: Medicare Other | Admitting: Podiatry

## 2016-03-02 ENCOUNTER — Ambulatory Visit: Payer: Medicare Other | Admitting: Podiatry

## 2016-04-25 ENCOUNTER — Other Ambulatory Visit
Admission: RE | Admit: 2016-04-25 | Discharge: 2016-04-25 | Disposition: A | Discharge status: Home / Self Care | Payer: BLUE CROSS/BLUE SHIELD | Source: Ambulatory Visit | Attending: Family Medicine | Admitting: Family Medicine

## 2016-04-25 DIAGNOSIS — M79605 Pain in left leg: Secondary | ICD-10-CM | POA: Insufficient documentation

## 2016-04-25 LAB — FIBRIN DERIVATIVES D-DIMER (ARMC ONLY): FIBRIN DERIVATIVES D-DIMER (ARMC): 334 (ref 0–499)

## 2016-09-04 ENCOUNTER — Encounter (HOSPITAL_COMMUNITY): Payer: Self-pay | Admitting: Emergency Medicine

## 2016-09-04 ENCOUNTER — Emergency Department (HOSPITAL_COMMUNITY): Payer: BLUE CROSS/BLUE SHIELD

## 2016-09-04 ENCOUNTER — Emergency Department (HOSPITAL_COMMUNITY)
Admission: EM | Admit: 2016-09-04 | Discharge: 2016-09-04 | Disposition: A | Discharge status: Home / Self Care | Payer: BLUE CROSS/BLUE SHIELD | Attending: Emergency Medicine | Admitting: Emergency Medicine

## 2016-09-04 DIAGNOSIS — Z7982 Long term (current) use of aspirin: Secondary | ICD-10-CM | POA: Diagnosis not present

## 2016-09-04 DIAGNOSIS — R0789 Other chest pain: Secondary | ICD-10-CM | POA: Diagnosis not present

## 2016-09-04 DIAGNOSIS — I10 Essential (primary) hypertension: Secondary | ICD-10-CM | POA: Insufficient documentation

## 2016-09-04 DIAGNOSIS — Z87891 Personal history of nicotine dependence: Secondary | ICD-10-CM | POA: Diagnosis not present

## 2016-09-04 DIAGNOSIS — J45909 Unspecified asthma, uncomplicated: Secondary | ICD-10-CM | POA: Insufficient documentation

## 2016-09-04 DIAGNOSIS — E119 Type 2 diabetes mellitus without complications: Secondary | ICD-10-CM | POA: Diagnosis not present

## 2016-09-04 DIAGNOSIS — Z79899 Other long term (current) drug therapy: Secondary | ICD-10-CM | POA: Diagnosis not present

## 2016-09-04 DIAGNOSIS — R072 Precordial pain: Secondary | ICD-10-CM | POA: Diagnosis present

## 2016-09-04 LAB — BASIC METABOLIC PANEL WITH GFR
Anion gap: 13 (ref 5–15)
BUN: <5 mg/dL — ABNORMAL LOW (ref 6–20)
CO2: 26 mmol/L (ref 22–32)
Calcium: 9.6 mg/dL (ref 8.9–10.3)
Chloride: 100 mmol/L — ABNORMAL LOW (ref 101–111)
Creatinine, Ser: 0.71 mg/dL (ref 0.44–1.00)
GFR calc Af Amer: >60 mL/min
GFR calc non Af Amer: >60 mL/min
Glucose, Bld: 389 mg/dL — ABNORMAL HIGH (ref 65–99)
Potassium: 4.2 mmol/L (ref 3.5–5.1)
Sodium: 139 mmol/L (ref 135–145)

## 2016-09-04 LAB — CBC
HCT: 43.1 % (ref 36.0–46.0)
Hemoglobin: 14.2 g/dL (ref 12.0–15.0)
MCH: 27.8 pg (ref 26.0–34.0)
MCHC: 32.9 g/dL (ref 30.0–36.0)
MCV: 84.5 fL (ref 78.0–100.0)
PLATELETS: 273 10*3/uL (ref 150–400)
RBC: 5.1 MIL/uL (ref 3.87–5.11)
RDW: 13.2 % (ref 11.5–15.5)
WBC: 5.1 10*3/uL (ref 4.0–10.5)

## 2016-09-04 LAB — I-STAT TROPONIN, ED
Troponin i, poc: 0 ng/mL (ref 0.00–0.08)
Troponin i, poc: 0 ng/mL (ref 0.00–0.08)

## 2016-09-04 MED ORDER — ACETAMINOPHEN 325 MG PO TABS
650.0000 mg | ORAL_TABLET | Freq: Once | ORAL | Status: AC
  Administered 2016-09-04: 650 mg via ORAL
  Filled 2016-09-04: qty 2

## 2016-09-04 MED ORDER — ASPIRIN 81 MG PO CHEW
324.0000 mg | CHEWABLE_TABLET | Freq: Once | ORAL | Status: AC
  Administered 2016-09-04: 324 mg via ORAL
  Filled 2016-09-04: qty 4

## 2016-09-04 MED ORDER — NITROGLYCERIN 0.4 MG SL SUBL
0.4000 mg | SUBLINGUAL_TABLET | SUBLINGUAL | Status: DC | PRN
  Administered 2016-09-04: 0.4 mg via SUBLINGUAL
  Filled 2016-09-04: qty 1

## 2016-09-04 NOTE — ED Notes (Signed)
Patient transported to X-ray 

## 2016-09-04 NOTE — ED Triage Notes (Signed)
Pt states she started having central CP non radiating approx 20 minutes ago. No other symptoms.

## 2016-09-04 NOTE — ED Provider Notes (Signed)
[facility] DEPT Provider Note   CSN: [id number] Arrival date & time: 09/04/16  1038     History   Chief Complaint Chief Complaint  Patient presents with  . Chest Pain    HPI [name] is a 51 y.o. female PMH DM, HTN, OSA, morbid obesity presents for two hours substernal cramping chest pain. Pt states she had just taken her son to school and noted sudden onset chest pain. Pain is non-radiating, no diaphoresis, dyspnea or N/V. Pain is similar to an episode she had in September 2017, for which she was admitted for and a stress echo was negative for inducible ischemia. No hx DVT/PE. No medication PTA and pain is now a dull ache. Pt also notes feeling fatigued for a few days and currently has a headache.   The history is provided by the patient and medical records.  Chest Pain   This is a recurrent problem. The current episode started 1 to 2 hours ago. The problem occurs constantly. The problem has been gradually improving. The pain is associated with rest. The pain is present in the substernal region. The pain is mild. The quality of the pain is described as dull. The pain does not radiate. Duration of episode(s) is 2 hours. Associated symptoms include headaches and malaise/fatigue. Pertinent negatives include no abdominal pain, no back pain, no claudication, no cough, no diaphoresis, no dizziness, no exertional chest pressure, no fever, no hemoptysis, no leg pain, no lower extremity edema, no nausea, no near-syncope, no numbness, no palpitations, no shortness of breath, no syncope, no vomiting and no weakness. She has tried rest for the symptoms. Risk factors include obesity.  Her past medical history is significant for diabetes, hyperlipidemia, hypertension and sleep apnea.  Pertinent negatives for past medical history include no CAD, no COPD, no CHF, no DVT, no MI, no PE, no PVD, no sickle cell disease, no stimulant use and no TIA.  Her family medical history is significant  for CAD, diabetes and hypertension.  Procedure history is positive for echocardiogram and stress echo.  Procedure history is negative for cardiac catheterization.    Past Medical History:  Diagnosis Date  . Asthma   . Diabetes mellitus without complication ([place])   . Fatty liver   . Hypertension   . Morbid obesity with BMI of 40.0-44.9, adult ([place])   . OSA (obstructive sleep apnea)    mild with AHI 11/hr now on CPAP at 20cm H2O    Patient Active Problem List   Diagnosis Date Noted  . Hypokalemia 02/17/2016  . Chest pain 02/16/2016  . Morbid obesity with BMI of 40.0-44.9, adult ([place]) 05/15/2013  . Fatty liver   . OSA (obstructive sleep apnea)   . Hypertension   . Diabetes mellitus without complication ([place])   . Asthma     Past Surgical History:  Procedure Laterality Date  . ABDOMINAL HYSTERECTOMY    . BACK SURGERY     l4-5  . CARPAL TUNNEL RELEASE     right  . SHOULDER SURGERY    . SPINAL CORD STIMULATOR IMPLANT      OB History    No data available       Home Medications    Prior to Admission medications   Medication Sig Start Date End Date Taking? Authorizing Provider  amLODipine (NORVASC) 10 MG tablet Take 10 mg by mouth every morning.  04/11/14   Historical Provider, MD  aspirin 81 MG tablet Take 81 mg by mouth every evening.  chewable    Historical Provider, MD  Aspirin-Acetaminophen-Caffeine (GOODY HEADACHE PO) Take 1 Package by mouth as needed (headache).    Historical Provider, MD  Calcium Carbonate-Vitamin D (CALCIUM 600 + D PO) Take 2 tablets by mouth every morning.     Historical Provider, MD  carboxymethylcellulose (REFRESH PLUS) 0.5 % SOLN Place 1 drop into both eyes 3 (three) times daily as needed (dry eyes).    Historical Provider, MD  Eszopiclone 3 MG TABS Take 3 mg by mouth at bedtime as needed (sleep). Take immediately before bedtime     Historical Provider, MD  glimepiride (AMARYL) 2 MG tablet Take 4 mg by mouth daily before breakfast.      Historical Provider, MD  hydrochlorothiazide (HYDRODIURIL) 25 MG tablet Take 25 mg by mouth daily as needed (fluid).     Historical Provider, MD  metFORMIN (GLUCOPHAGE-XR) 500 MG 24 hr tablet Take 2,000 mg by mouth daily with supper.    Historical Provider, MD  metoprolol (TOPROL-XL) 200 MG 24 hr tablet Take 200 mg by mouth every evening.     Historical Provider, MD  NONFORMULARY OR COMPOUNDED [place] compound:  Onychomycosis Nail Lacquer - fluconazole 2%, Terbinafine 1%, DMSO, apply to affected area daily. 01/10/16   Trula Slade, DPM  pantoprazole (PROTONIX) 40 MG tablet Take 1 tablet (40 mg total) by mouth daily. 02/18/16   Belkys A Regalado, MD  sertraline (ZOLOFT) 50 MG tablet Take 75 mg by mouth every morning.    Historical Provider, MD  traMADol (ULTRAM) 50 MG tablet Take 1 tablet (50 mg total) by mouth every 6 (six) hours as needed for moderate pain. 02/18/16   Elmarie Shiley, MD    Family History Family History  Problem Relation Age of Onset  . Hypertension Mother   . Diabetes Mother   . Lymphoma Mother   . Breast cancer Mother   . Hypertension Father   . Diabetes Father     Social History Social History  Substance Use Topics  . Smoking status: Former Research scientist (life sciences)  . Smokeless tobacco: Never Used  . Alcohol use Yes     Comment: socially      Allergies   Patient has no known allergies.   Review of Systems Review of Systems  Constitutional: Positive for fatigue and malaise/fatigue. Negative for diaphoresis and fever.  Respiratory: Negative for cough, hemoptysis, chest tightness and shortness of breath.   Cardiovascular: Positive for chest pain. Negative for palpitations, claudication, leg swelling, syncope and near-syncope.  Gastrointestinal: Negative for abdominal pain, nausea and vomiting.  Musculoskeletal: Negative for back pain, myalgias, neck pain and neck stiffness.  Neurological: Positive for headaches. Negative for dizziness, facial asymmetry,  weakness, light-headedness and numbness.  All other systems reviewed and are negative.    Physical Exam Updated Vital Signs BP (!) 159/104   Pulse 74   Temp 98 F (36.7 C) (Oral)   Resp 17   Ht 5\' 6"  (1.676 m)   Wt 113.4 kg   SpO2 97%   BMI 40.35 kg/m   Physical Exam  Constitutional: She is oriented to person, place, and time. She appears well-developed and well-nourished. No distress.  HENT:  Head: Normocephalic and atraumatic.  Eyes: Conjunctivae and EOM are normal.  Neck: Normal range of motion. Neck supple.  Cardiovascular: Normal rate and regular rhythm.   No murmur heard. Pulmonary/Chest: Effort normal and breath sounds normal. No respiratory distress.  Abdominal: Soft. There is no tenderness.  Musculoskeletal: Normal range of motion.  She exhibits no edema.  Neurological: She is alert and oriented to person, place, and time.  Skin: Skin is warm and dry. Capillary refill takes less than 2 seconds.  Psychiatric: She has a normal mood and affect.  Nursing note and vitals reviewed.    ED Treatments / Results  Labs (all labs ordered are listed, but only abnormal results are displayed) Labs Reviewed  BASIC METABOLIC PANEL - Abnormal; Notable for the following:       Result Value   Chloride 100 (*)    Glucose, Bld 389 (*)    BUN <5 (*)    All other components within normal limits  CBC  I-STAT TROPOININ, ED  I-STAT TROPOININ, ED    EKG  EKG Interpretation  Date/Time:  Friday September 04 2016 10:43:31 EDT Ventricular Rate:  99 PR Interval:  144 QRS Duration: 104 QT Interval:  374 QTC Calculation: 479 R Axis:   47 Text Interpretation:  Normal sinus rhythm Normal ECG No significant change since last tracing Confirmed by [facility] MD, Junie Panning ([id number]) on 09/04/2016 11:34:17 AM Also confirmed by Billy Fischer MD, ERIN ([id number])  on 09/04/2016 11:56:32 AM       Radiology Dg Chest 2 View  Result Date: 09/04/2016 CLINICAL DATA:  Cramping and mid chest pain for the past  30 minutes. Smoker. EXAM: CHEST  2 VIEW COMPARISON:  02/16/2016. FINDINGS: Normal sized heart. Tortuous aorta. Clear lungs. Stable neural stimulator leads in the thoracic spinal valve. IMPRESSION: No acute abnormality. Electronically Signed   By: Claudie Revering M.D.   On: 09/04/2016 11:09    Procedures Procedures (including critical care time)  Medications Ordered in ED Medications  nitroGLYCERIN (NITROSTAT) SL tablet 0.4 mg (0.4 mg Sublingual Given 09/04/16 1203)  acetaminophen (TYLENOL) tablet 650 mg (650 mg Oral Given 09/04/16 1146)  aspirin chewable tablet 324 mg (324 mg Oral Given 09/04/16 1204)     Initial Impression / Assessment and Plan / ED Course  I have reviewed the triage vital signs and the nursing notes.  Pertinent labs & imaging results that were available during my care of the patient were reviewed by me and considered in my medical decision making (see chart for details).    51 y.o. female presents for two hours substernal chest pain. VSS, NAD. Unable to [facility] due to age, Well's negative. Doubt PE or PNA. Given tylenol for HA. - EKG NSR, no ischemic changes, similar to previous studies - CXR unremarkable - CBC, BMP wnl Doubt myocardial infarction given negative delta troponin. Chest pain is pinpoint and reproducible on exam. Recent stress echo negative for inducible ischemia. Discussed with patient, who wishes to follow-up as out-patient, believe this is reasonable given HEAR score 3. Advised to follow-up within one week. Pt voiced understanding and agreement with plan.   Discussed with my attending physician, Dr Billy Fischer  Final Clinical Impressions(s) / ED Diagnoses   Final diagnoses:  Atypical chest pain    New Prescriptions Discharge Medication List as of 09/04/2016  3:34 PM       Monico Blitz, MD 09/04/16 [place], MD 09/07/16 2225

## 2016-09-17 DIAGNOSIS — Z7984 Long term (current) use of oral hypoglycemic drugs: Secondary | ICD-10-CM | POA: Diagnosis not present

## 2016-09-17 DIAGNOSIS — Z79899 Other long term (current) drug therapy: Secondary | ICD-10-CM | POA: Diagnosis not present

## 2016-09-17 DIAGNOSIS — M79672 Pain in left foot: Secondary | ICD-10-CM | POA: Diagnosis not present

## 2016-09-17 DIAGNOSIS — Z90711 Acquired absence of uterus with remaining cervical stump: Secondary | ICD-10-CM | POA: Diagnosis not present

## 2016-09-17 DIAGNOSIS — E119 Type 2 diabetes mellitus without complications: Secondary | ICD-10-CM | POA: Diagnosis not present

## 2016-09-17 DIAGNOSIS — S93522A Sprain of metatarsophalangeal joint of left great toe, initial encounter: Secondary | ICD-10-CM | POA: Diagnosis not present

## 2016-09-17 DIAGNOSIS — I10 Essential (primary) hypertension: Secondary | ICD-10-CM | POA: Diagnosis not present

## 2016-09-17 DIAGNOSIS — S99922A Unspecified injury of left foot, initial encounter: Secondary | ICD-10-CM | POA: Diagnosis not present

## 2016-11-18 ENCOUNTER — Other Ambulatory Visit: Payer: Self-pay | Admitting: Obstetrics and Gynecology

## 2016-11-18 DIAGNOSIS — Z1231 Encounter for screening mammogram for malignant neoplasm of breast: Secondary | ICD-10-CM

## 2016-12-03 DIAGNOSIS — J069 Acute upper respiratory infection, unspecified: Secondary | ICD-10-CM | POA: Diagnosis not present

## 2016-12-07 DIAGNOSIS — J069 Acute upper respiratory infection, unspecified: Secondary | ICD-10-CM | POA: Diagnosis not present

## 2016-12-07 DIAGNOSIS — H6691 Otitis media, unspecified, right ear: Secondary | ICD-10-CM | POA: Diagnosis not present

## 2016-12-09 ENCOUNTER — Ambulatory Visit
Admission: RE | Admit: 2016-12-09 | Discharge: 2016-12-09 | Disposition: A | Discharge status: Home / Self Care | Payer: BLUE CROSS/BLUE SHIELD | Source: Ambulatory Visit | Attending: Obstetrics and Gynecology | Admitting: Obstetrics and Gynecology

## 2016-12-09 DIAGNOSIS — Z1231 Encounter for screening mammogram for malignant neoplasm of breast: Secondary | ICD-10-CM

## 2017-01-08 ENCOUNTER — Ambulatory Visit
Admission: RE | Admit: 2017-01-08 | Discharge: 2017-01-08 | Disposition: A | Discharge status: Home / Self Care | Payer: BLUE CROSS/BLUE SHIELD | Source: Ambulatory Visit | Attending: Cardiology | Admitting: Cardiology

## 2017-01-08 ENCOUNTER — Encounter: Payer: Self-pay | Admitting: Cardiology

## 2017-01-08 ENCOUNTER — Ambulatory Visit (INDEPENDENT_AMBULATORY_CARE_PROVIDER_SITE_OTHER): Payer: BLUE CROSS/BLUE SHIELD | Admitting: Cardiology

## 2017-01-08 VITALS — BP 164/96 | HR 75 | Ht 66.0 in | Wt 254.8 lb

## 2017-01-08 DIAGNOSIS — R05 Cough: Secondary | ICD-10-CM | POA: Diagnosis not present

## 2017-01-08 DIAGNOSIS — R0602 Shortness of breath: Secondary | ICD-10-CM

## 2017-01-08 DIAGNOSIS — I739 Peripheral vascular disease, unspecified: Secondary | ICD-10-CM | POA: Diagnosis not present

## 2017-01-08 DIAGNOSIS — R059 Cough, unspecified: Secondary | ICD-10-CM

## 2017-01-08 LAB — BASIC METABOLIC PANEL
BUN/Creatinine Ratio: 14 (ref 9–23)
BUN: 9 mg/dL (ref 6–24)
CO2: 26 mmol/L (ref 20–29)
CREATININE: 0.63 mg/dL (ref 0.57–1.00)
Calcium: 9.1 mg/dL (ref 8.7–10.2)
Chloride: 101 mmol/L (ref 96–106)
GFR calc Af Amer: 121 mL/min/{1.73_m2} (ref >59)
GFR calc non Af Amer: 105 mL/min/{1.73_m2} (ref >59)
GLUCOSE: 133 mg/dL — AB (ref 65–99)
Potassium: 4.3 mmol/L (ref 3.5–5.2)
Sodium: 142 mmol/L (ref 134–144)

## 2017-01-08 LAB — PRO B NATRIURETIC PEPTIDE: NT-PRO BNP: 24 pg/mL (ref 0–249)

## 2017-01-08 MED ORDER — ASPIRIN EC 81 MG PO TBEC: 81.0000 mg | DELAYED_RELEASE_TABLET | Freq: Every day | ORAL | Status: AC

## 2017-01-08 NOTE — Progress Notes (Signed)
01/08/2017 Arlys Scatena   05/31/1966  [id number]  Primary Physician Maurice Small, MD Primary Cardiologist: Dr. Radford Pax    Reason for Visit/CC: Persistent Cough and bilateral LEE? Claudication   HPI:  Chela Sutphen is a 51 y.o. female  with no prior cardiac history, but multiple risk factors including HTN, T2DM, prior tobacco history, family h/o CAD and personal history of obesity as well as a h/o OSA and asthma. In Sep. 2017, she was admitted for CP w/u. She ruled out for MI and had a negative NST and normal 2D echo. EF was 55-60%. She was also being followed by Dr. Radford Pax for OSA but has not been seen since 04/2014. She admits that she has not been fully complaint with her CPAP.   She presents to clinic with 2 main complaints. The first is a persistent dry cough. She wants to be sure this is not heart related. She denies sputum production. She is not on an ACE-I. No fever or chills. She is a former smoker but quit in 2204. She also notes SOB walking up stairs but she is obese. No resting dyspnea. She has noticed mild LEE. She has HCTZ on her med list, but only takes this PRN, although she notes she has not taken this recently. Her BP is elevated at 164/69.  Her other complaint is leg cramping, L>R. This is worse with ambulation. She is a former smoker and has h/o DM. She has palpable pulses on exam but they are weak.     Current Meds  Medication Sig  . amLODipine (NORVASC) 10 MG tablet Take 10 mg by mouth every morning.   . Aspirin-Acetaminophen-Caffeine (GOODY HEADACHE PO) Take 1 Package by mouth as needed (headache).  . Calcium Carbonate-Vitamin D (CALCIUM 600 + D PO) Take 2 tablets by mouth every morning.   . carboxymethylcellulose (REFRESH PLUS) 0.5 % SOLN Place 1 drop into both eyes 3 (three) times daily as needed (dry eyes).  . Eszopiclone 3 MG TABS Take 3 mg by mouth at bedtime as needed (sleep). Take immediately before bedtime   . glimepiride (AMARYL) 4 MG  tablet Take 4 mg by mouth daily.  . hydrochlorothiazide (HYDRODIURIL) 25 MG tablet Take 25 mg by mouth daily as needed (fluid).   . metFORMIN (GLUCOPHAGE-XR) 500 MG 24 hr tablet Take 2,000 mg by mouth daily with supper.  . metoprolol (TOPROL-XL) 200 MG 24 hr tablet Take 200 mg by mouth every evening.   . [DISCONTINUED] aspirin 81 MG tablet Take 81 mg by mouth every evening. chewable   No Known Allergies Past Medical History:  Diagnosis Date  . Asthma   . Diabetes mellitus without complication ([place])   . Fatty liver   . Hypertension   . Morbid obesity with BMI of 40.0-44.9, adult ([place])   . OSA (obstructive sleep apnea)    mild with AHI 11/hr now on CPAP at 20cm H2O   Family History  Problem Relation Age of Onset  . Hypertension Mother   . Diabetes Mother   . Lymphoma Mother   . Breast cancer Mother   . Hypertension Father   . Diabetes Father   . Breast cancer Maternal Grandmother    Past Surgical History:  Procedure Laterality Date  . ABDOMINAL HYSTERECTOMY    . BACK SURGERY     l4-5  . CARPAL TUNNEL RELEASE     right  . SHOULDER SURGERY    . SPINAL CORD STIMULATOR IMPLANT  Social History   Social History  . Marital status: Married    Spouse name: N/A  . Number of children: N/A  . Years of education: N/A   Occupational History  . Not on file.   Social History Main Topics  . Smoking status: Former Research scientist (life sciences)  . Smokeless tobacco: Never Used  . Alcohol use Yes     Comment: socially   . Drug use: No  . Sexual activity: Not on file   Other Topics Concern  . Not on file   Social History Narrative  . No narrative on file     Review of Systems: General: negative for chills, fever, night sweats or weight changes.  Cardiovascular: negative for chest pain, dyspnea on exertion, edema, orthopnea, palpitations, paroxysmal nocturnal dyspnea or shortness of breath Dermatological: negative for rash Respiratory: negative for cough or wheezing Urologic: negative for  hematuria Abdominal: negative for nausea, vomiting, diarrhea, bright red blood per rectum, melena, or hematemesis Neurologic: negative for visual changes, syncope, or dizziness All other systems reviewed and are otherwise negative except as noted above.   Physical Exam:  Blood pressure (!) 164/96, pulse 75, height 5\' 6"  (1.676 m), weight 254 lb 12.8 oz (115.6 kg).  General appearance: alert, cooperative and no distress Neck: no carotid bruit and no JVD Lungs: clear to auscultation bilaterally Heart: regular rate and rhythm, S1, S2 normal, no murmur, click, rub or gallop Extremities: extremities normal, atraumatic, no cyanosis or edema Pulses: 2+ and symmetric Skin: Skin color, texture, turgor normal. No rashes or lesions Neurologic: Grossly normal  EKG not performed -- personally reviewed   ASSESSMENT AND PLAN:   1. Dry Persistent Cough: lung exam unremarkable, but with HTN and mild LEE, ? Mild volume overload. We will check a BNP today and if elevated, would advised that she start taking HCTZ daily, which will also help with BP (has only been using PRN). Check BMP today for baseline assessment of renal function and lytes. She is also a former smoker. We will check a CXR.   2. Bilateral LEE pain: worse with ambulation. ?claudication. She has risk factors for PVD: HTN, prior tobacco use and DM. We will arrange for bilateral LE arterial dopplers.   3. HTN: BP is elevated. Check BMP today. If stable, restart HCTZ.   Follow-Up with me in 2 weeks.   Brittainy [name], MHS [facility] 01/08/2017 5:30 PM

## 2017-01-08 NOTE — Patient Instructions (Addendum)
Medication Instructions:  Your physician recommends that you continue on your current medications as directed. Please refer to the Current Medication list given to you today.   Labwork: TODAY - BMET, BNP   Testing/Procedures: Your physician has requested that you have a lower extremity arterial duplex. This test is an ultrasound of the arteries in the legs or arms. It looks at arterial blood flow in the legs and arms. Allow one hour for Lower and Upper Arterial scans. There are no restrictions or special instructions  A chest x-ray takes a picture of the organs and structures inside the chest, including the heart, lungs, and blood vessels. This test can show several things, including, whether the heart is enlarges; whether fluid is building up in the lungs; and whether pacemaker / defibrillator leads are still in place.   Follow-Up: Your physician recommends that you schedule a follow-up appointment in: 3-4 weeks after your arterial duplex   If you need a refill on your cardiac medications before your next appointment, please call your pharmacy.   Thank you for choosing [facility]! Christen Bame, RN [phone]

## 2017-01-18 ENCOUNTER — Other Ambulatory Visit: Payer: Self-pay | Admitting: Cardiology

## 2017-01-18 DIAGNOSIS — I739 Peripheral vascular disease, unspecified: Secondary | ICD-10-CM

## 2017-01-26 DIAGNOSIS — E785 Hyperlipidemia, unspecified: Secondary | ICD-10-CM | POA: Diagnosis not present

## 2017-01-26 DIAGNOSIS — J4520 Mild intermittent asthma, uncomplicated: Secondary | ICD-10-CM | POA: Diagnosis not present

## 2017-01-26 DIAGNOSIS — G4700 Insomnia, unspecified: Secondary | ICD-10-CM | POA: Diagnosis not present

## 2017-01-26 DIAGNOSIS — F331 Major depressive disorder, recurrent, moderate: Secondary | ICD-10-CM | POA: Diagnosis not present

## 2017-01-26 DIAGNOSIS — I517 Cardiomegaly: Secondary | ICD-10-CM | POA: Diagnosis not present

## 2017-01-26 DIAGNOSIS — G4733 Obstructive sleep apnea (adult) (pediatric): Secondary | ICD-10-CM | POA: Diagnosis not present

## 2017-01-26 DIAGNOSIS — I10 Essential (primary) hypertension: Secondary | ICD-10-CM | POA: Diagnosis not present

## 2017-01-26 DIAGNOSIS — E119 Type 2 diabetes mellitus without complications: Secondary | ICD-10-CM | POA: Diagnosis not present

## 2017-01-26 DIAGNOSIS — D171 Benign lipomatous neoplasm of skin and subcutaneous tissue of trunk: Secondary | ICD-10-CM | POA: Diagnosis not present

## 2017-01-29 ENCOUNTER — Ambulatory Visit (HOSPITAL_COMMUNITY)
Admission: RE | Admit: 2017-01-29 | Discharge: 2017-01-29 | Disposition: A | Discharge status: Home / Self Care | Payer: BLUE CROSS/BLUE SHIELD | Source: Ambulatory Visit | Attending: Cardiovascular Disease | Admitting: Cardiovascular Disease

## 2017-01-29 DIAGNOSIS — R05 Cough: Secondary | ICD-10-CM | POA: Insufficient documentation

## 2017-01-29 DIAGNOSIS — R059 Cough, unspecified: Secondary | ICD-10-CM

## 2017-01-29 DIAGNOSIS — R0602 Shortness of breath: Secondary | ICD-10-CM | POA: Insufficient documentation

## 2017-01-29 DIAGNOSIS — I739 Peripheral vascular disease, unspecified: Secondary | ICD-10-CM | POA: Insufficient documentation

## 2017-02-08 ENCOUNTER — Telehealth: Payer: Self-pay | Admitting: Cardiology

## 2017-02-08 NOTE — Telephone Encounter (Signed)
-----   Message from [place], [place] sent at 02/03/2017  5:12 PM EDT ----- Leg ultrasounds were normal. No signs of blood flow abnormalities.

## 2017-02-08 NOTE — Telephone Encounter (Signed)
[name] Is returning a call .Marland Kitchen Thanks

## 2017-02-08 NOTE — Telephone Encounter (Signed)
Returned pts call and we discussed her leg [place] results (vas [place] tbi) See result note

## 2017-02-25 ENCOUNTER — Ambulatory Visit (INDEPENDENT_AMBULATORY_CARE_PROVIDER_SITE_OTHER): Payer: BLUE CROSS/BLUE SHIELD | Admitting: Cardiology

## 2017-02-25 ENCOUNTER — Encounter: Payer: Self-pay | Admitting: Cardiology

## 2017-02-25 VITALS — BP 116/70 | HR 67 | Ht 66.0 in | Wt 258.8 lb

## 2017-02-25 DIAGNOSIS — G4733 Obstructive sleep apnea (adult) (pediatric): Secondary | ICD-10-CM

## 2017-02-25 NOTE — Progress Notes (Signed)
02/25/2017 [name]   Sep 16, 1965  [id number]  Primary Physician Maurice Small, MD Primary Cardiologist: Dr. Radford Pax   Reason for Visit/CC: F/u for bilateral Leg pain and cough   HPI:  [name] is a 51 y.o. female with no prior cardiac history, but multiple risk factors including HTN, T2DM, prior tobacco history, family h/o CAD and personal history ofobesity as well as a h/o OSA and asthma. In Sep. 2017, she was admitted for CP w/u. She ruled out for MI and had a negative NST and normal 2D echo. EF was 55-60%. She was also being followed by Dr. Radford Pax for OSA but has not been seen since 04/2014. She has been compliant however with her CPAP. She notes however that she does not have a SD card to go with her machine.   She was seen 6 weeks ago on 01/08/17 with complaint of persistent cough (pt wanted to be sure not heart related/ not on ACE-I). She also complained of bilateral LE leg pain w/ activity that was concerning for claudication. We ordered bilateral LEE arterial dopplers. Study was normal. No PVD.   Given her chronic persistent cough and h/o smoking CXR was obtained and unremarkable. BNP was also checked and WNL. BMP with normal K at 4.3.   She presents back to clinic today for f/u. She is doing well. Cough resolved ? If asthma related. Her BP is well controlled. No chest pain or dyspnea.   Current Meds  Medication Sig  . amLODipine (NORVASC) 10 MG tablet Take 10 mg by mouth every morning.   Marland Kitchen aspirin EC 81 MG tablet Take 1 tablet (81 mg total) by mouth daily.  . Aspirin-Acetaminophen-Caffeine (GOODY HEADACHE PO) Take 1 Package by mouth as needed (headache).  . Calcium Carbonate-Vitamin D (CALCIUM 600 + D PO) Take 2 tablets by mouth every morning.   . carboxymethylcellulose (REFRESH PLUS) 0.5 % SOLN Place 1 drop into both eyes 3 (three) times daily as needed (dry eyes).  Marland Kitchen escitalopram (LEXAPRO) 10 MG tablet Take 10 mg by mouth daily.   . Eszopiclone 3  MG TABS Take 3 mg by mouth at bedtime as needed (sleep). Take immediately before bedtime   . glimepiride (AMARYL) 4 MG tablet Take 4 mg by mouth daily.  . hydrochlorothiazide (HYDRODIURIL) 25 MG tablet Take 25 mg by mouth daily as needed (fluid).   . metFORMIN (GLUCOPHAGE-XR) 500 MG 24 hr tablet Take 2,000 mg by mouth daily with supper.  . metoprolol (TOPROL-XL) 200 MG 24 hr tablet Take 200 mg by mouth every evening.    No Known Allergies Past Medical History:  Diagnosis Date  . Asthma   . Diabetes mellitus without complication ([place])   . Fatty liver   . Hypertension   . Morbid obesity with BMI of 40.0-44.9, adult ([place])   . OSA (obstructive sleep apnea)    mild with AHI 11/hr now on CPAP at 20cm H2O   Family History  Problem Relation Age of Onset  . Hypertension Mother   . Diabetes Mother   . Lymphoma Mother   . Breast cancer Mother   . Hypertension Father   . Diabetes Father   . Breast cancer Maternal Grandmother    Past Surgical History:  Procedure Laterality Date  . ABDOMINAL HYSTERECTOMY    . BACK SURGERY     l4-5  . CARPAL TUNNEL RELEASE     right  . SHOULDER SURGERY    . SPINAL CORD STIMULATOR IMPLANT  Social History   Social History  . Marital status: Married    Spouse name: N/A  . Number of children: N/A  . Years of education: N/A   Occupational History  . Not on file.   Social History Main Topics  . Smoking status: Former Research scientist (life sciences)  . Smokeless tobacco: Never Used  . Alcohol use Yes     Comment: socially   . Drug use: No  . Sexual activity: Not on file   Other Topics Concern  . Not on file   Social History Narrative  . No narrative on file     Review of Systems: General: negative for chills, fever, night sweats or weight changes.  Cardiovascular: negative for chest pain, dyspnea on exertion, edema, orthopnea, palpitations, paroxysmal nocturnal dyspnea or shortness of breath Dermatological: negative for rash Respiratory: negative for cough or  wheezing Urologic: negative for hematuria Abdominal: negative for nausea, vomiting, diarrhea, bright red blood per rectum, melena, or hematemesis Neurologic: negative for visual changes, syncope, or dizziness All other systems reviewed and are otherwise negative except as noted above.   Physical Exam:  Blood pressure 116/70, pulse 67, height 5\' 6"  (1.676 m), weight 258 lb 12.8 oz (117.4 kg), SpO2 96 %.  General appearance: alert, cooperative, no distress and moderately obese Neck: no carotid bruit and no JVD Lungs: clear to auscultation bilaterally Heart: regular rate and rhythm, S1, S2 normal, no murmur, click, rub or gallop Extremities: extremities normal, atraumatic, no cyanosis or edema Pulses: 2+ and symmetric Skin: Skin color, texture, turgor normal. No rashes or lesions Neurologic: Grossly normal  EKG not performed -- personally reviewed   ASSESSMENT AND PLAN:   Patient stable from a cardiac standpoint. No CP or dyspnea. Low risk NST and normal 2D echo September 2017. BP is well controlled and cardiac risk factors followed/ managed by PCP. Pt with OSA and needs to get back in routinely with Dr. Radford Pax in her sleep clinic. Pt may also need new equipment. ? Issue with SD card for CPAP machine. Will route info to sleep clinic RN to contact patient and assist. F/u with Dr. Radford Pax in sleep clinic in 3-6 months.    [name] [name], MHS [facility] 02/25/2017 12:32 PM

## 2017-02-25 NOTE — Patient Instructions (Addendum)
Medication Instructions:  Your physician recommends that you continue on your current medications as directed. Please refer to the Current Medication list given to you today.   Labwork: None ordered  Testing/Procedures: None ordered  Follow-Up: Your physician wants you to follow-up in: 6 MONTHS WITH DR. TURNER.  You will receive a reminder letter in the mail two months in advance. If you don't receive a letter, please call our office to schedule the follow-up appointment.    Any Other Special Instructions Will Be Listed Below (If Applicable).    If you need a refill on your cardiac medications before your next appointment, please call your pharmacy.   

## 2017-02-26 ENCOUNTER — Telehealth: Payer: Self-pay | Admitting: *Deleted

## 2017-02-26 NOTE — Telephone Encounter (Signed)
-----   Message from Consuelo Pandy, [place] sent at 02/25/2017 12:36 PM EDT ----- Can you please contact patient about issues with her CPAP. She also needs to get back in with Dr. Radford Pax in 3-6 months for sleep clinic. Thanks.

## 2017-02-26 NOTE — Telephone Encounter (Signed)
Reached out to patient to offer assistance for her CPAP issues.  Patient informed me that she and her husband both have the old machines (S-9) and they both have trouble replacing the chip once they remove it to get a download.  They have talked with and visited [facility] on several occasions with their chips to get new ones  and those don't fit in their machines either but they were told each time that the chip would fit. Patient and her husband grew tired and quit trying to get a replacement chip so neither machine has a chip.  Reached out to [facility] spoke to [place] about the problem and she stated there are two places located on the back of the machine but only one slot fits the chip.  Apolonio Schneiders will reach out to the patient to offer help over the phone and if needed have the patient come into the store for help.

## 2017-03-11 ENCOUNTER — Encounter: Payer: Self-pay | Admitting: Cardiology

## 2017-03-25 ENCOUNTER — Telehealth: Payer: Self-pay | Admitting: *Deleted

## 2017-03-25 NOTE — Telephone Encounter (Signed)
-----   Message from Sueanne Margarita, MD sent at 03/18/2017  9:27 AM EDT ----- Good AHI on CPAP but needs to improve compliance

## 2017-03-25 NOTE — Telephone Encounter (Signed)
Informed patient of compliance results and verbalized understanding was indicated. Patient understands that her apnea events are in normal range at 0.9. Patient has been encouraged to improve her compliance. Patient states her mask had a tear on the bottom and she has been waiting for her new supplies to come. Patient has her new mask now and she is back in compliance. Patient was grateful for the call and thanked me.

## 2017-05-11 ENCOUNTER — Encounter: Payer: Self-pay | Admitting: Podiatry

## 2017-05-11 ENCOUNTER — Ambulatory Visit (INDEPENDENT_AMBULATORY_CARE_PROVIDER_SITE_OTHER): Payer: Medicare Other

## 2017-05-11 ENCOUNTER — Ambulatory Visit (INDEPENDENT_AMBULATORY_CARE_PROVIDER_SITE_OTHER): Payer: Medicare Other | Admitting: Podiatry

## 2017-05-11 DIAGNOSIS — M659 Synovitis and tenosynovitis, unspecified: Secondary | ICD-10-CM | POA: Diagnosis not present

## 2017-05-11 DIAGNOSIS — S92505S Nondisplaced unspecified fracture of left lesser toe(s), sequela: Secondary | ICD-10-CM

## 2017-05-13 NOTE — Progress Notes (Signed)
Subjective: [name] presents the office today for concerns of left second toe pain which is been ongoing since April 2018.  She states that she had stepped out of hot tub/tub and she slipped bending her toe and she had pain.  She went to urgent care and she was told that she had turf toe.  She is given a surgical shoe which she is told to be nonweightbearing.  She did wear the surgical shoe for 6 weeks before coming out of it but she did not have follow-up she is continued to have pain and she points to the distal aspect of the digit where she gets her symptoms.  She states that her toe in general "feels funny". She has no other concerns today.  Denies any systemic complaints such as fevers, chills, nausea, vomiting. No acute changes since last appointment, and no other complaints at this time.   Objective: AAO x3, NAD DP/PT pulses palpable bilaterally, CRT less than 3 seconds There is minimal swelling to the left second toe.  The toes and rectus position.  There is tenderness to the distal phalanx and middle phalanx of the digit.  Is no pain with MPJ and or with MPJ range of motion.  There is no erythema or increased warmth.  There is trace swelling to the digit. No open lesions or pre-ulcerative lesions.  No pain with calf compression, swelling, warmth, erythema  Assessment: Concern for old fracture left second toe  Plan: -All treatment options discussed with the patient including all alternatives, risks, complications.  -X-rays were obtained and reviewed.  There is an old fracture of the distal and middle phalanx of the left second digit no evidence of acute fracture. -Dispensed a graphite insert to put inside of her shoe -Anti-inflammatories as needed -Follow-up as scheduled or sooner if needed -Patient encouraged to call the office with any questions, concerns, change in symptoms.   Trula Slade DPM

## 2017-06-09 ENCOUNTER — Ambulatory Visit: Payer: Medicare Other | Admitting: Podiatry

## 2017-06-10 ENCOUNTER — Ambulatory Visit (INDEPENDENT_AMBULATORY_CARE_PROVIDER_SITE_OTHER): Payer: Medicare Other | Admitting: Podiatry

## 2017-06-10 ENCOUNTER — Ambulatory Visit (INDEPENDENT_AMBULATORY_CARE_PROVIDER_SITE_OTHER): Payer: Medicare Other

## 2017-06-10 DIAGNOSIS — S92505S Nondisplaced unspecified fracture of left lesser toe(s), sequela: Secondary | ICD-10-CM

## 2017-06-14 NOTE — Progress Notes (Signed)
Subjective: Ms. presents [name] presents the office today for concerns of left second toe follow-up for fracture.  She states that she is doing much better today when a graphite insert inside the shoe.  She still gets some minimal occasional discomfort but overall she is feeling much better.  She has not has any significant redness or any swelling.  She has no new concerns today. Denies any systemic complaints such as fevers, chills, nausea, vomiting. No acute changes since last appointment, and no other complaints at this time.   Objective: AAO x3, NAD DP/PT pulses palpable bilaterally, CRT less than 3 seconds There is very minimal tenderness to palpation to the distal aspect of the second toe and there is no overlying edema, erythema, increase in warmth.  There is no skin breakdown.  There is no other area of tenderness identified at this time. No open lesions or pre-ulcerative lesions.  No pain with calf compression, swelling, warmth, erythema  Assessment: Healing fracture left second toe   Plan: -All treatment options discussed with the patient including all alternatives, risks, complications.  -X-rays were obtained and reviewed.  There is evidence of healing to the fracture of the second digit.  There is no evidence of acute fracture identified otherwise. -At this time she can start to transition to regular shoe as able.  Gradually increase activity level.  She still having discomfort the next 3-4 weeks to call the office or sooner if there is any worsening or changes.  She agrees with this plan has no further questions. -Patient encouraged to call the office with any questions, concerns, change in symptoms.   [name] DPM

## 2017-11-30 ENCOUNTER — Ambulatory Visit (INDEPENDENT_AMBULATORY_CARE_PROVIDER_SITE_OTHER): Payer: Managed Care, Other (non HMO) | Admitting: Cardiology

## 2017-11-30 ENCOUNTER — Encounter: Payer: Self-pay | Admitting: Cardiology

## 2017-11-30 ENCOUNTER — Encounter (INDEPENDENT_AMBULATORY_CARE_PROVIDER_SITE_OTHER): Payer: Self-pay

## 2017-11-30 VITALS — BP 149/88 | HR 76 | Ht 66.0 in | Wt 258.4 lb

## 2017-11-30 DIAGNOSIS — I208 Other forms of angina pectoris: Secondary | ICD-10-CM

## 2017-11-30 DIAGNOSIS — Z6841 Body Mass Index (BMI) 40.0 and over, adult: Secondary | ICD-10-CM

## 2017-11-30 DIAGNOSIS — R079 Chest pain, unspecified: Secondary | ICD-10-CM

## 2017-11-30 DIAGNOSIS — I10 Essential (primary) hypertension: Secondary | ICD-10-CM | POA: Diagnosis not present

## 2017-11-30 DIAGNOSIS — I2089 Other forms of angina pectoris: Secondary | ICD-10-CM

## 2017-11-30 DIAGNOSIS — G4733 Obstructive sleep apnea (adult) (pediatric): Secondary | ICD-10-CM

## 2017-11-30 NOTE — Patient Instructions (Addendum)
Medication Instructions:  Your physician recommends that you continue on your current medications as directed. Please refer to the Current Medication list given to you today.  If you need a refill on your cardiac medications, please contact your pharmacy first.  Labwork: Your physician recommends that you return for lab within 7 days prior to scheduled coronary CT scan for BMET    Testing/Procedures: Please arrive at the [facility] main entrance of [facility] at (30-45 minutes prior to test start time)  [facility] [address], [place] [address] [phone]  Proceed to the [facility] Radiology Department (First Floor).  Please follow these instructions carefully (unless otherwise directed):  On the Night Before the Test: . Drink plenty of water. . Do not consume any caffeinated/decaffeinated beverages or chocolate 12 hours prior to your test. . Do not take any antihistamines 12 hours prior to your test. . If you take Metformin do not take 24 hours prior to test.  On the Day of the Test: . Drink plenty of water. Do not drink any water within one hour of the test. . Do not eat any food 4 hours prior to the test. . You may take your regular medications prior to the test.  After the Test: . Drink plenty of water. . After receiving IV contrast, you may experience a mild flushed feeling. This is normal. . On occasion, you may experience a mild rash up to 24 hours after the test. This is not dangerous. If this occurs, you can take Benadryl 25 mg and increase your fluid intake. . If you experience trouble breathing, this can be serious. If it is severe call 911 IMMEDIATELY. If it is mild, please call our office. . If you take any of these medications: Glipizide/Metformin, Avandament, Glucavance, please do not take 48 hours after completing test.  Follow-Up: Your physician wants you to follow-up in: 1 year with Dr. Radford Pax. You will receive a reminder  letter in the mail two months in advance. If you don't receive a letter, please call our office to schedule the follow-up appointment.  Any Other Special Instructions Will Be Listed Below (If Applicable).   Thank you for choosing [facility], RN  [phone]  If you need a refill on your cardiac medications before your next appointment, please call your pharmacy.

## 2017-11-30 NOTE — Progress Notes (Signed)
Cardiology Office Note:    Date:  11/30/2017   ID:  Mavery Milling, DOB 1965/11/12, [name]  PCP:  Maurice Small, MD  Cardiologist:  No primary care provider on file.    Referring MD: Maurice Small, MD   Chief Complaint  Patient presents with  . Sleep Apnea  . Hypertension    History of Present Illness:    [name] is a 52 y.o. female with a hx of  HTN, T2DM, prior tobacco history, family h/o CAD and personal history ofobesity as well as a h/o OSA and asthma. In Sep. 2017, she was admitted for CP w/u. She ruled out for MI and had a negative NST and normal 2D echo. EF was 55-60%.  She also has OSA and is using CPAP.    She is here today for followup and is doing well.  She denies any chest pain or pressure, SOB, DOE, PND, orthopnea, LE edema, dizziness, palpitations or syncope. She is compliant with her meds and is tolerating meds with no SE.  She is doing well with her CPAP device.  She tolerates the mask and feels the pressure is adequate.  Since going on CPAP she feels rested in the am and has no significant daytime sleepiness.  She denies any significant mouth or nasal dryness or nasal congestion.  She does not think that he snores.       Past Medical History:  Diagnosis Date  . Asthma   . Diabetes mellitus without complication ([place])   . Fatty liver   . Hypertension   . Morbid obesity with BMI of 40.0-44.9, adult ([place])   . OSA (obstructive sleep apnea)    mild with AHI 11/hr now on CPAP at 20cm H2O    Past Surgical History:  Procedure Laterality Date  . ABDOMINAL HYSTERECTOMY    . BACK SURGERY     l4-5  . CARPAL TUNNEL RELEASE     right  . SHOULDER SURGERY    . SPINAL CORD STIMULATOR IMPLANT      Current Medications: Current Meds  Medication Sig  . amLODipine (NORVASC) 10 MG tablet Take 10 mg by mouth every morning.   Marland Kitchen aspirin EC 81 MG tablet Take 1 tablet (81 mg total) by mouth daily.  . Aspirin-Acetaminophen-Caffeine (GOODY  HEADACHE PO) Take 1 Package by mouth as needed (headache).  . Calcium Carbonate-Vitamin D (CALCIUM 600 + D PO) Take 2 tablets by mouth every morning.   . carboxymethylcellulose (REFRESH PLUS) 0.5 % SOLN Place 1 drop into both eyes 3 (three) times daily as needed (dry eyes).  Marland Kitchen escitalopram (LEXAPRO) 10 MG tablet Take 10 mg by mouth daily.   . Eszopiclone 3 MG TABS Take 3 mg by mouth at bedtime as needed (sleep). Take immediately before bedtime   . glimepiride (AMARYL) 4 MG tablet Take 4 mg by mouth daily.  . metFORMIN (GLUCOPHAGE-XR) 500 MG 24 hr tablet Take 2,000 mg by mouth daily with supper.  . metoprolol (TOPROL-XL) 200 MG 24 hr tablet Take 200 mg by mouth every evening.      Allergies:   Patient has no known allergies.   Social History   Socioeconomic History  . Marital status: Married    Spouse name: Not on file  . Number of children: Not on file  . Years of education: Not on file  . Highest education level: Not on file  Occupational History  . Not on file  Social Needs  . Financial  resource strain: Not on file  . Food insecurity:    Worry: Not on file    Inability: Not on file  . Transportation needs:    Medical: Not on file    Non-medical: Not on file  Tobacco Use  . Smoking status: Former Research scientist (life sciences)  . Smokeless tobacco: Never Used  Substance and Sexual Activity  . Alcohol use: Yes    Comment: socially   . Drug use: No  . Sexual activity: Not on file  Lifestyle  . Physical activity:    Days per week: Not on file    Minutes per session: Not on file  . Stress: Not on file  Relationships  . Social connections:    Talks on phone: Not on file    Gets together: Not on file    Attends religious service: Not on file    Active member of club or organization: Not on file    Attends meetings of clubs or organizations: Not on file    Relationship status: Not on file  Other Topics Concern  . Not on file  Social History Narrative  . Not on file     Family History: The  patient's family history includes Breast cancer in her maternal grandmother and mother; Diabetes in her father and mother; Hypertension in her father and mother; Lymphoma in her mother.  ROS:   Please see the history of present illness.    ROS  All other systems reviewed and negative.   EKGs/Labs/Other Studies Reviewed:    The following studies were reviewed today: PAP download  EKG:  EKG is not ordered today.    Recent Labs: 01/08/2017: BUN 9; Creatinine, Ser 0.63; NT-Pro BNP 24; Potassium 4.3; Sodium 142   Recent Lipid Panel No results found for: CHOL, TRIG, HDL, CHOLHDL, VLDL, LDLCALC, LDLDIRECT  Physical Exam:    VS:  BP (!) 149/88   Pulse 76   Ht 5\' 6"  (1.676 m)   Wt 258 lb 6.4 oz (117.2 kg)   BMI 41.71 kg/m     Wt Readings from Last 3 Encounters:  11/30/17 258 lb 6.4 oz (117.2 kg)  02/25/17 258 lb 12.8 oz (117.4 kg)  01/08/17 254 lb 12.8 oz (115.6 kg)     GEN:  Well nourished, well developed in no acute distress HEENT: Normal NECK: No JVD; No carotid bruits LYMPHATICS: No lymphadenopathy CARDIAC: RRR, no murmurs, rubs, gallops RESPIRATORY:  Clear to auscultation without rales, wheezing or rhonchi  ABDOMEN: Soft, non-tender, non-distended MUSCULOSKELETAL:  No edema; No deformity  SKIN: Warm and dry NEUROLOGIC:  Alert and oriented x 3 PSYCHIATRIC:  Normal affect   ASSESSMENT:    1. OSA (obstructive sleep apnea)   2. Essential hypertension   3. Morbid obesity with BMI of 40.0-44.9, adult ([facility])   4. Chest pain, unspecified type    PLAN:    In order of problems listed above:  1.  OSA - the patient is tolerating PAP therapy well without any problems. The PAP download was reviewed today and showed an AHI of 1/hr on 20 cm H2O with 24% compliance in using more than 4 hours nightly.  Going to decrease her CPAP to 18 cm H2O to see if she can be more compliant with her device and then get another download in 2 weeks.  The patient has been using and benefiting from  PAP use and will continue to benefit from therapy.  Encouraged her to try to be more compliant with her CPAP therapy and  try to use on a nightly basis.  2.  HTN - BP is well controlled on exam today.  She will continue on amlodipine 10 mg daily and Toprol-XL 200 mg daily.  3.  Obesity - I have encouraged her to get into a routine exercise program and cut back on carbs and portions.   4.  Chest pain -she complains of a squeezing pressure sensation over her left breast when she exerts herself as well as occasionally at night that wakes her up.  She occasionally will have some nausea with it.  She denies any shortness of breath or diaphoresis with the discomfort.  She has not had any dyspnea on exertion.  She had a nuclear stress test 2 years ago for similar discomfort which showed no ischemia.  Since she continues to have symptoms and has cardiac risk factors including diabetes, hypertension, remote smoking history and obesity, I am going to get a coronary CTA with FFR to rule out significant CAD.   Medication Adjustments/Labs and Tests Ordered: Current medicines are reviewed at length with the patient today.  Concerns regarding medicines are outlined above.  No orders of the defined types were placed in this encounter.  No orders of the defined types were placed in this encounter.   Signed, Fransico Him, MD  11/30/2017 11:32 AM    [place]

## 2018-02-18 DIAGNOSIS — M1990 Unspecified osteoarthritis, unspecified site: Secondary | ICD-10-CM | POA: Diagnosis not present

## 2018-02-18 DIAGNOSIS — I10 Essential (primary) hypertension: Secondary | ICD-10-CM | POA: Diagnosis not present

## 2018-02-18 DIAGNOSIS — E538 Deficiency of other specified B group vitamins: Secondary | ICD-10-CM | POA: Diagnosis not present

## 2018-02-18 DIAGNOSIS — E119 Type 2 diabetes mellitus without complications: Secondary | ICD-10-CM | POA: Diagnosis not present

## 2018-02-18 DIAGNOSIS — R5382 Chronic fatigue, unspecified: Secondary | ICD-10-CM | POA: Diagnosis not present

## 2018-02-18 DIAGNOSIS — M25551 Pain in right hip: Secondary | ICD-10-CM | POA: Diagnosis not present

## 2018-02-18 DIAGNOSIS — Z7984 Long term (current) use of oral hypoglycemic drugs: Secondary | ICD-10-CM | POA: Diagnosis not present

## 2018-02-18 DIAGNOSIS — L720 Epidermal cyst: Secondary | ICD-10-CM | POA: Diagnosis not present

## 2018-03-01 ENCOUNTER — Ambulatory Visit
Admission: RE | Admit: 2018-03-01 | Discharge: 2018-03-01 | Disposition: A | Discharge status: Home / Self Care | Payer: Medicare Other | Source: Ambulatory Visit | Attending: Family Medicine | Admitting: Family Medicine

## 2018-03-01 ENCOUNTER — Other Ambulatory Visit: Payer: Self-pay | Admitting: Family Medicine

## 2018-03-01 DIAGNOSIS — M25551 Pain in right hip: Secondary | ICD-10-CM | POA: Diagnosis not present

## 2018-04-23 ENCOUNTER — Encounter (HOSPITAL_COMMUNITY): Payer: Self-pay | Admitting: Emergency Medicine

## 2018-04-23 ENCOUNTER — Ambulatory Visit (HOSPITAL_COMMUNITY)
Admission: EM | Admit: 2018-04-23 | Discharge: 2018-04-23 | Disposition: A | Discharge status: Home / Self Care | Payer: Medicare Other | Attending: Internal Medicine | Admitting: Internal Medicine

## 2018-04-23 DIAGNOSIS — T148XXA Other injury of unspecified body region, initial encounter: Secondary | ICD-10-CM

## 2018-04-23 DIAGNOSIS — S39012A Strain of muscle, fascia and tendon of lower back, initial encounter: Secondary | ICD-10-CM

## 2018-04-23 DIAGNOSIS — M5386 Other specified dorsopathies, lumbar region: Secondary | ICD-10-CM

## 2018-04-23 MED ORDER — METHOCARBAMOL 750 MG PO TABS: 750.0000 mg | ORAL_TABLET | Freq: Three times a day (TID) | ORAL | 0 refills | Status: DC

## 2018-04-23 NOTE — ED Triage Notes (Signed)
Pt states yesterday morning her knee gave out and she caught herself with her shopping cart, did not fall but feels like she pulled her back, c/o lower back pain, states shes had four surgeries in that same spot.

## 2018-04-23 NOTE — Discharge Instructions (Addendum)
1- use ice on your back for 15-20 minutes every 2 hours for 48h of the injury, then switch to alternation with heat, and after the heat do the stretches we talked about.   2- Work on avoiding sugars and white flower so your diabetes improves. Sugars cause more inflammation in your body.   3- Take the muscle relaxer as indicated, but do not take during the day if you have to drive.   4- Follow up with your back specialist in 7-10 days.   5- Take Tylenol 1000 mg every 6 hours for pain. Anti-inflammatories with your Lexapro may cause stomach bleeding.

## 2018-04-23 NOTE — ED Provider Notes (Signed)
[place]    CSN: [id number] Arrival date & time: 04/23/18  1003     History   Chief Complaint Chief Complaint  Patient presents with  . Back Pain    HPI [name] is a 52 y.o. female.   Who when shopping at [employer] yesterday am she stepped on a sale sign that was on the floor with her R foot which caused her let to slip sideways and as she caught herself to prevent a fall, prevented her to land on the floor. The more she walked during the time she was shopping within 30 minutes noted her back get tight and gradually started feeling r sciatica from buttocks to L mid posterior thigh. When she went home she just laid down and did not treat herself in any way with ice/ heat or mediactions. She did not sleep well last night due to the pain and had to get up several times. This am got up she felt pain on both lower back areas radiating to buttocks and now to L knee. Pain on her lower back is described as achy and tightness. Pain level 6/10. The  Radiation to posterior thighs fees achy. Denies numbness. She has had 4 spine surgeries consisting of lumbar fusion L4-L5, and last one 2009, and had STIM placed 2012. Prior to yesterday she has had flairs of similar pain in the past and she just waits it out til resolves and normally lasts 30 minutes and only happens a few times q 3 months. The last time time was June. She states she reported her injury to the store [employer].      Past Medical History:  Diagnosis Date  . Asthma   . Diabetes mellitus without complication ([place])   . Fatty liver   . Hypertension   . Morbid obesity with BMI of 40.0-44.9, adult ([employer])   . OSA (obstructive sleep apnea)    mild with AHI 11/hr now on CPAP at 20cm H2O    Patient Active Problem List   Diagnosis Date Noted  . Hypokalemia 02/17/2016  . Chest pain 02/16/2016  . Morbid obesity with BMI of 40.0-44.9, adult ([place]) 05/15/2013  . Fatty liver   . OSA (obstructive sleep apnea)   .  Hypertension   . Diabetes mellitus without complication ([place])   . Asthma     Past Surgical History:  Procedure Laterality Date  . ABDOMINAL HYSTERECTOMY    . BACK SURGERY     l4-5  . CARPAL TUNNEL RELEASE     right  . SHOULDER SURGERY    . SPINAL CORD STIMULATOR IMPLANT      OB History   None      Home Medications    Prior to Admission medications   Medication Sig Start Date End Date Taking? Authorizing Provider  amLODipine (NORVASC) 10 MG tablet Take 10 mg by mouth every morning.  04/11/14   [provider]  aspirin EC 81 MG tablet Take 1 tablet (81 mg total) by mouth daily. 01/08/17   Lyda Jester M, PA-C  Aspirin-Acetaminophen-Caffeine (GOODY HEADACHE PO) Take 1 Package by mouth as needed (headache).    [provider]  Calcium Carbonate-Vitamin D (CALCIUM 600 + D PO) Take 2 tablets by mouth every morning.     [provider]  carboxymethylcellulose (REFRESH PLUS) 0.5 % SOLN Place 1 drop into both eyes 3 (three) times daily as needed (dry eyes).    [provider]  escitalopram (LEXAPRO) 10  MG tablet Take 10 mg by mouth daily.  02/22/17   [provider]  Eszopiclone 3 MG TABS Take 3 mg by mouth at bedtime as needed (sleep). Take immediately before bedtime     [provider]  glimepiride (AMARYL) 4 MG tablet Take 4 mg by mouth daily. 11/24/16   [provider]  metFORMIN (GLUCOPHAGE-XR) 500 MG 24 hr tablet Take 2,000 mg by mouth daily with supper.    [provider]  metoprolol (TOPROL-XL) 200 MG 24 hr tablet Take 200 mg by mouth every evening.     [provider]    Family History Family History  Problem Relation Age of Onset  . Hypertension Mother   . Diabetes Mother   . Lymphoma Mother   . Breast cancer Mother   . Hypertension Father   . Diabetes Father   . Breast cancer Maternal Grandmother     Social History Social History   Tobacco Use  . Smoking status: Former Research scientist (life sciences)    . Smokeless tobacco: Never Used  Substance Use Topics  . Alcohol use: Yes    Comment: socially   . Drug use: No     Allergies   Patient has no known allergies.   Review of Systems Review of Systems  Constitutional: Negative for fever.  Respiratory: Negative for shortness of breath.   Cardiovascular: Negative for chest pain.  Gastrointestinal: Negative for abdominal pain and nausea.  Endocrine: Positive for polyuria.       Admits DM is in poor control, her glucose have been somewhere from 200- 400, last time checked was 2 days ago and was in the mid 200's.   Genitourinary: Negative for dysuria, enuresis, frequency and urgency.  Musculoskeletal: Positive for back pain. Negative for arthralgias, gait problem and joint swelling.  Skin: Negative for rash.  Neurological: Negative for numbness.       Has chronic L leg weakness, this is not worse     Physical Exam Triage Vital Signs ED Triage Vitals [04/23/18 1026]  Enc Vitals Group     BP (!) 153/102     Pulse Rate 74     Resp 16     Temp 97.8 F (36.6 C)     Temp src      SpO2 95 %     Weight      Height      Head Circumference      Peak Flow      Pain Score 6     Pain Loc      Pain Edu?      Excl. in [place]?    No data found.  Updated Vital Signs BP (!) 153/102   Pulse 74   Temp 97.8 F (36.6 C)   Resp 16   SpO2 95%   Visual Acuity Right Eye Distance:   Left Eye Distance:   Bilateral Distance:    Right Eye Near:   Left Eye Near:    Bilateral Near:     Physical Exam  Constitutional: She is oriented to person, place, and time. She appears well-developed. No distress.  Moves slow, but able to get up and down from exam table to the floor and raise up from laying down and sitting up with mild pain.   HENT:  Head: Normocephalic.  Right Ear: External ear normal.  Left Ear: External ear normal.  Nose: Nose normal.  Eyes: Conjunctivae are normal. Right eye exhibits no discharge. Left eye exhibits no  discharge. No  scleral icterus.  Neck: Normal range of motion. Neck supple.  Cardiovascular: Normal rate and regular rhythm.  No murmur heard. Pulmonary/Chest: Effort normal and breath sounds normal.  Abdominal: Soft. Bowel sounds are normal. She exhibits no distension and no mass. There is no tenderness. There is no guarding.  Musculoskeletal:  BACK- has well healed 2 scarbs on lower lumbar region. Has minimal pain with palpation on lumbar region. More tender on upper buttocks region bilaterally. ROM of spine is slightly decreased with anterior flexion to 60 degrees due to pain on R lumbosacral region, but no pain radiating to her legs. Lateral flexion is normal, but provoked pain on opposite sides she was flexing to. Posterior flexion was normal with mild pain. Neg SLR  Lower Extremities normal strength and ROM with no pain on hamstring with palpation.   Neurological: She is alert and oriented to person, place, and time. She displays abnormal reflex. No cranial nerve deficit or sensory deficit. She exhibits normal muscle tone. Coordination normal.  Skin: Skin is warm and dry. Capillary refill takes less than 2 seconds. No rash noted. She is not diaphoretic.  Psychiatric: She has a normal mood and affect. Her behavior is normal. Judgment and thought content normal.     UC Treatments / Results  Labs (all labs ordered are listed, but only abnormal results are displayed) Labs Reviewed - No data to display  Initial Impression / Assessment and Plan / UC Course  I have reviewed the triage vital signs and the nursing notes.     Final Clinical Impressions(s) / UC Diagnoses   Final diagnoses:  None   Discharge Instructions   None    ED Prescriptions    None     Controlled Substance Prescriptions [place] Controlled Substance Registry consulted?    Shelby Mattocks, PA-C 04/23/18 1212

## 2018-05-05 ENCOUNTER — Other Ambulatory Visit: Payer: Self-pay | Admitting: Obstetrics and Gynecology

## 2018-05-05 ENCOUNTER — Ambulatory Visit
Admission: RE | Admit: 2018-05-05 | Discharge: 2018-05-05 | Disposition: A | Discharge status: Home / Self Care | Payer: Medicare Other | Source: Ambulatory Visit | Attending: Obstetrics and Gynecology | Admitting: Obstetrics and Gynecology

## 2018-05-05 DIAGNOSIS — Z1231 Encounter for screening mammogram for malignant neoplasm of breast: Secondary | ICD-10-CM

## 2018-05-16 DIAGNOSIS — E1140 Type 2 diabetes mellitus with diabetic neuropathy, unspecified: Secondary | ICD-10-CM | POA: Diagnosis not present

## 2018-05-30 ENCOUNTER — Encounter: Payer: Self-pay | Admitting: Podiatry

## 2018-05-30 ENCOUNTER — Ambulatory Visit (INDEPENDENT_AMBULATORY_CARE_PROVIDER_SITE_OTHER): Payer: Managed Care, Other (non HMO) | Admitting: Podiatry

## 2018-05-30 ENCOUNTER — Ambulatory Visit (INDEPENDENT_AMBULATORY_CARE_PROVIDER_SITE_OTHER): Payer: Managed Care, Other (non HMO)

## 2018-05-30 DIAGNOSIS — M722 Plantar fascial fibromatosis: Secondary | ICD-10-CM

## 2018-05-30 DIAGNOSIS — M7661 Achilles tendinitis, right leg: Secondary | ICD-10-CM

## 2018-05-30 MED ORDER — DICLOFENAC SODIUM 1 % TD GEL: 2.0000 g | Freq: Four times a day (QID) | TRANSDERMAL | 2 refills | Status: DC

## 2018-05-30 MED ORDER — CLOTRIMAZOLE-BETAMETHASONE 1-0.05 % EX CREA: 1.0000 "application " | TOPICAL_CREAM | Freq: Two times a day (BID) | CUTANEOUS | 0 refills | Status: DC

## 2018-05-30 NOTE — Patient Instructions (Signed)

## 2018-06-01 DIAGNOSIS — M722 Plantar fascial fibromatosis: Secondary | ICD-10-CM | POA: Insufficient documentation

## 2018-06-01 NOTE — Progress Notes (Signed)
Subjective: 52 year old female presents the office today for concerns of right heel pain.  She says it hurts on the bottom of her heel and is been getting worse over the last year.  She states he is also been some tingling to the left foot.  She is been going to an endocrinologist because her A1c is 10.7.  She also states that she has had some sciatic issues previous on her left side so she thinks of the numbness can be accommodation of diabetes with her history of nerve injury in the left side.  Also she has been seen a cardiologist and had an ultrasound to check for blockages she reports.  She has no recent injury to her feet and she has no other concerns. Denies any systemic complaints such as fevers, chills, nausea, vomiting. No acute changes since last appointment, and no other complaints at this time.   Objective: AAO x3, NAD DP/PT pulses palpable bilaterally, CRT less than 3 seconds There is mild tenderness palpation on the plantar aspect of the right calcaneus along the insertion of plantar fashion along the plantar lateral aspect of the calcaneus.  There is no pain on the course the peroneal tendon..  Some mild discomfort on the course of the Achilles tendon but overall Thompson test is negative the Achilles tendon appears to be intact..  Flexor, extensor tendons appear to be intact.  Minimal edema but there is no erythema or warmth. Equinus is present.  No open lesions or pre-ulcerative lesions.  No pain with calf compression, swelling, warmth, erythema  Assessment: Achilles tendinitis right foot; Achilles tendinitis  Plan: -All treatment options discussed with the patient including all alternatives, risks, complications.  -X-rays were obtained and reviewed.  No evidence of acute fracture or stress fracture identified today.  Heel spurs are present both inferiorly and posteriorly. -The majority of her symptoms on the left foot are likely due to her previous nerve injury.  I doubt this is  from diabetes as it is a unilateral issue.  She has palpable pulses I doubt this is due to a vascular issue.  We will continue to monitor left foot.  In regards to the right foot I prescribed Voltaren gel. Night splint dispensed with an ice pack.   Discussed traction, icing exercises daily.  Discussed shoe modifications and orthotics. -Patient encouraged to call the office with any questions, concerns, change in symptoms.   Trula Slade DPM

## 2018-06-13 DIAGNOSIS — Z6841 Body Mass Index (BMI) 40.0 and over, adult: Secondary | ICD-10-CM | POA: Diagnosis not present

## 2018-06-13 DIAGNOSIS — M545 Low back pain: Secondary | ICD-10-CM | POA: Diagnosis not present

## 2018-06-16 ENCOUNTER — Encounter: Payer: Self-pay | Admitting: Endocrinology

## 2018-06-20 LAB — HM COLONOSCOPY

## 2018-06-21 HISTORY — PX: COLONOSCOPY: SHX174

## 2018-06-22 ENCOUNTER — Telehealth: Payer: Self-pay

## 2018-06-22 NOTE — Telephone Encounter (Signed)
Will exdrtional angina pay for it?

## 2018-06-22 NOTE — Telephone Encounter (Signed)
Patient needs to have her cardiac CT reordered under unstable angina, instead of chest pain, unspecific. Sending to Dr. Radford Pax.

## 2018-06-27 ENCOUNTER — Ambulatory Visit: Payer: Medicare Other | Admitting: Podiatry

## 2018-06-27 NOTE — Addendum Note (Signed)
Addended by: Sarina Ill on: 06/27/2018 08:09 AM   Modules accepted: Orders

## 2018-06-27 NOTE — Telephone Encounter (Signed)
Yes [name] said it would be covered.

## 2018-06-29 ENCOUNTER — Other Ambulatory Visit: Payer: Medicare Other | Admitting: *Deleted

## 2018-06-29 ENCOUNTER — Encounter (INDEPENDENT_AMBULATORY_CARE_PROVIDER_SITE_OTHER): Payer: Self-pay

## 2018-06-29 DIAGNOSIS — R079 Chest pain, unspecified: Secondary | ICD-10-CM

## 2018-06-29 LAB — BASIC METABOLIC PANEL
BUN/Creatinine Ratio: 11 (ref 9–23)
BUN: 7 mg/dL (ref 6–24)
CO2: 26 mmol/L (ref 20–29)
Calcium: 9.7 mg/dL (ref 8.7–10.2)
Chloride: 99 mmol/L (ref 96–106)
Creatinine, Ser: 0.66 mg/dL (ref 0.57–1.00)
GFR calc Af Amer: 117 mL/min/{1.73_m2} (ref >59)
GFR calc non Af Amer: 102 mL/min/{1.73_m2} (ref >59)
Glucose: 207 mg/dL — ABNORMAL HIGH (ref 65–99)
Potassium: 4.5 mmol/L (ref 3.5–5.2)
Sodium: 140 mmol/L (ref 134–144)

## 2018-06-30 ENCOUNTER — Ambulatory Visit: Payer: Medicare Other | Admitting: Podiatry

## 2018-07-05 ENCOUNTER — Telehealth (HOSPITAL_COMMUNITY): Payer: Self-pay | Admitting: Emergency Medicine

## 2018-07-05 NOTE — Telephone Encounter (Signed)
Left message on voicemail with name and callback number Dell Briner RN Navigator Cardiac Imaging [facility] [phone] Office [phone] Cell  

## 2018-07-06 ENCOUNTER — Ambulatory Visit (HOSPITAL_COMMUNITY): Payer: Managed Care, Other (non HMO)

## 2018-07-06 ENCOUNTER — Telehealth (HOSPITAL_COMMUNITY): Payer: Self-pay | Admitting: Emergency Medicine

## 2018-07-06 ENCOUNTER — Ambulatory Visit (HOSPITAL_COMMUNITY): Admission: RE | Admit: 2018-07-06 | Payer: Managed Care, Other (non HMO) | Source: Ambulatory Visit

## 2018-07-06 NOTE — Telephone Encounter (Signed)
Patient cannot able to make appointment today, will reschedule   Marchia Bond RN Navigator Cardiac Imaging [facility] [phone] Office  [phone] Cell

## 2018-07-15 ENCOUNTER — Telehealth (HOSPITAL_COMMUNITY): Payer: Self-pay | Admitting: Emergency Medicine

## 2018-07-15 NOTE — Telephone Encounter (Signed)
Left message on voicemail with name and callback number Nevah Dalal RN Navigator Cardiac Imaging [facility] [phone] Office [phone] Cell  

## 2018-07-18 ENCOUNTER — Ambulatory Visit (HOSPITAL_COMMUNITY): Admission: RE | Admit: 2018-07-18 | Payer: Managed Care, Other (non HMO) | Source: Ambulatory Visit

## 2018-07-18 ENCOUNTER — Ambulatory Visit (HOSPITAL_COMMUNITY): Payer: Managed Care, Other (non HMO)

## 2018-07-18 DIAGNOSIS — R69 Illness, unspecified: Secondary | ICD-10-CM | POA: Diagnosis not present

## 2018-07-18 DIAGNOSIS — H6691 Otitis media, unspecified, right ear: Secondary | ICD-10-CM | POA: Diagnosis not present

## 2018-07-18 DIAGNOSIS — R509 Fever, unspecified: Secondary | ICD-10-CM | POA: Diagnosis not present

## 2018-07-25 ENCOUNTER — Ambulatory Visit: Payer: Medicare Other | Admitting: Podiatry

## 2018-07-29 ENCOUNTER — Telehealth (HOSPITAL_COMMUNITY): Payer: Self-pay | Admitting: Emergency Medicine

## 2018-07-29 NOTE — Telephone Encounter (Signed)
Left message on voicemail with name and callback number Landon Bassford RN Navigator Cardiac Imaging [facility] [phone] Office [phone] Cell  

## 2018-08-01 ENCOUNTER — Ambulatory Visit (HOSPITAL_COMMUNITY): Payer: Managed Care, Other (non HMO)

## 2018-08-01 ENCOUNTER — Telehealth (HOSPITAL_COMMUNITY): Payer: Self-pay | Admitting: Emergency Medicine

## 2018-08-01 NOTE — Telephone Encounter (Signed)
Received message on my voicemail from patient stating she would not make it to todays appointment and wishes to reschedule.  Information sent to the cardiac CT schedulers. Left message on voicemail with name and callback number Marchia Bond RN Navigator Cardiac [facility] [phone] Office [phone] Cell

## 2018-08-10 ENCOUNTER — Telehealth (HOSPITAL_COMMUNITY): Payer: Self-pay | Admitting: Emergency Medicine

## 2018-08-10 NOTE — Telephone Encounter (Signed)
Reaching out to patient to offer assistance regarding upcoming cardiac imaging study; pt verbalizes understanding of appt date/time, parking situation and where to check in, pre-test NPO status and medications ordered, and verified current allergies; name and call back number provided for further questions should they arise Grace Bond RN Navigator Cardiac Imaging [facility] [phone] office [phone] cell  Pt instructed to hold metformin and glimipride tomorrow and day after

## 2018-08-11 ENCOUNTER — Ambulatory Visit (HOSPITAL_COMMUNITY)
Admission: RE | Admit: 2018-08-11 | Discharge: 2018-08-11 | Disposition: A | Discharge status: Home / Self Care | Payer: Managed Care, Other (non HMO) | Source: Ambulatory Visit | Attending: Cardiology | Admitting: Cardiology

## 2018-08-11 ENCOUNTER — Ambulatory Visit (HOSPITAL_COMMUNITY): Admission: RE | Admit: 2018-08-11 | Payer: Managed Care, Other (non HMO) | Source: Ambulatory Visit

## 2018-08-11 ENCOUNTER — Encounter: Payer: Self-pay | Admitting: Cardiology

## 2018-08-11 DIAGNOSIS — I77810 Thoracic aortic ectasia: Secondary | ICD-10-CM | POA: Insufficient documentation

## 2018-08-11 DIAGNOSIS — Z006 Encounter for examination for normal comparison and control in clinical research program: Secondary | ICD-10-CM

## 2018-08-11 DIAGNOSIS — I208 Other forms of angina pectoris: Secondary | ICD-10-CM | POA: Diagnosis not present

## 2018-08-11 MED ORDER — IOPAMIDOL (ISOVUE-370) INJECTION 76%
80.0000 mL | Freq: Once | INTRAVENOUS | Status: AC | PRN
  Administered 2018-08-11: 80 mL via INTRAVENOUS

## 2018-08-11 MED ORDER — NITROGLYCERIN 0.4 MG SL SUBL
0.8000 mg | SUBLINGUAL_TABLET | Freq: Once | SUBLINGUAL | Status: AC
  Administered 2018-08-11: 0.8 mg via SUBLINGUAL
  Filled 2018-08-11: qty 25

## 2018-08-11 MED ORDER — NITROGLYCERIN 0.4 MG SL SUBL
SUBLINGUAL_TABLET | SUBLINGUAL | Status: AC
  Filled 2018-08-11: qty 2

## 2018-08-11 NOTE — Research (Signed)
Wandra Scot met inclusion and exclusion criteria.  The informed consent form, study requirements and expectations were reviewed with the subject and questions and concerns were addressed prior to the signing of the consent form.  The subject verbalized understanding of the trial requirements.  The subject agreed to participate in the CADFEM trial and signed the informed consent.  The informed consent was obtained prior to performance of any protocol-specific procedures for the subject.  A copy of the signed informed consent was given to the subject and a copy was placed in the subject's medical record.   Dionne Bucy. [employer]

## 2018-08-12 ENCOUNTER — Telehealth: Payer: Self-pay

## 2018-08-12 DIAGNOSIS — I77810 Thoracic aortic ectasia: Secondary | ICD-10-CM

## 2018-08-12 NOTE — Telephone Encounter (Signed)
Spoke with the patient she expressed understanding about her results. She is having her PCP fax there fasting lab records to our office. Repeat echo order placed for 1 year.

## 2018-08-12 NOTE — Telephone Encounter (Signed)
-----   Message from Sueanne Margarita, MD sent at 08/11/2018 12:33 PM EST ----- Please let patient know the coronary CTA showed very mildly dilated a sending aortic root 3.8 cm, only 1 isolated small punctate area of calcium in the mid LAD otherwise no obstructive lesions with essentially coronary arteries.  Aggressive risk factor modification commended. Please get a copy of her last FLP from PCP.  He is get a 2D echo in 1 year to reassess a sending aorta and aortic root.

## 2018-09-16 DIAGNOSIS — Z6841 Body Mass Index (BMI) 40.0 and over, adult: Secondary | ICD-10-CM | POA: Diagnosis not present

## 2018-09-16 DIAGNOSIS — E785 Hyperlipidemia, unspecified: Secondary | ICD-10-CM | POA: Diagnosis not present

## 2018-09-16 DIAGNOSIS — G4733 Obstructive sleep apnea (adult) (pediatric): Secondary | ICD-10-CM | POA: Diagnosis not present

## 2018-09-16 DIAGNOSIS — I10 Essential (primary) hypertension: Secondary | ICD-10-CM | POA: Diagnosis not present

## 2018-09-16 DIAGNOSIS — J4520 Mild intermittent asthma, uncomplicated: Secondary | ICD-10-CM | POA: Diagnosis not present

## 2018-09-16 DIAGNOSIS — Z0000 Encounter for general adult medical examination without abnormal findings: Secondary | ICD-10-CM | POA: Diagnosis not present

## 2018-09-16 DIAGNOSIS — L304 Erythema intertrigo: Secondary | ICD-10-CM | POA: Diagnosis not present

## 2018-09-16 DIAGNOSIS — F331 Major depressive disorder, recurrent, moderate: Secondary | ICD-10-CM | POA: Diagnosis not present

## 2018-09-16 DIAGNOSIS — E119 Type 2 diabetes mellitus without complications: Secondary | ICD-10-CM | POA: Diagnosis not present

## 2018-12-19 DIAGNOSIS — E1140 Type 2 diabetes mellitus with diabetic neuropathy, unspecified: Secondary | ICD-10-CM | POA: Diagnosis not present

## 2018-12-27 DIAGNOSIS — E119 Type 2 diabetes mellitus without complications: Secondary | ICD-10-CM | POA: Diagnosis not present

## 2019-02-09 ENCOUNTER — Other Ambulatory Visit: Payer: Self-pay

## 2019-02-09 ENCOUNTER — Encounter (HOSPITAL_COMMUNITY): Payer: Self-pay

## 2019-02-09 ENCOUNTER — Ambulatory Visit (HOSPITAL_COMMUNITY)
Admission: EM | Admit: 2019-02-09 | Discharge: 2019-02-09 | Disposition: A | Discharge status: Home / Self Care | Payer: Medicare Other | Attending: Family Medicine | Admitting: Family Medicine

## 2019-02-09 ENCOUNTER — Ambulatory Visit (INDEPENDENT_AMBULATORY_CARE_PROVIDER_SITE_OTHER): Payer: Medicare Other

## 2019-02-09 DIAGNOSIS — R109 Unspecified abdominal pain: Secondary | ICD-10-CM | POA: Diagnosis not present

## 2019-02-09 DIAGNOSIS — I10 Essential (primary) hypertension: Secondary | ICD-10-CM | POA: Diagnosis not present

## 2019-02-09 DIAGNOSIS — K5900 Constipation, unspecified: Secondary | ICD-10-CM

## 2019-02-09 DIAGNOSIS — R1012 Left upper quadrant pain: Secondary | ICD-10-CM | POA: Diagnosis not present

## 2019-02-09 DIAGNOSIS — R071 Chest pain on breathing: Secondary | ICD-10-CM | POA: Diagnosis not present

## 2019-02-09 NOTE — ED Triage Notes (Signed)
Patient presents to Urgent Care with complaints of left upper abdominal pain since 3 days ago. Patient reports she has tried different otc meds to reduce gas and acid, no improvement. Pt states it is worse when she lays on the left side or takes a deep breath. Pt denies n/v/d or sob.

## 2019-02-09 NOTE — ED Provider Notes (Signed)
[place]    CSN: [id number] Arrival date & time: 02/09/19  1509       History   Chief Complaint Chief Complaint  Patient presents with  . Abdominal Pain    HPI [name] is a 52 y.o. female.   HPI  Patient is here with left upper quadrant abdominal pain.  Is been constant for 3 days.  Not affected by food.  Normal appetite and eating.  Normal bowels.  No trouble with sense of smell or taste.  No fever or chills.  No body aches.  No exposure to illness.  No nausea or vomiting.  The pain is in a specific area in the left upper belly just at the lower ribs.  Hurts with deep breath.  Better with exhale.  Not affected by position.  She is never had this before.  Normal bowel movement today.  No known food allergies.  No known colon disorders or GERD.  Past Medical History:  Diagnosis Date  . Asthma   . Diabetes mellitus without complication ([place])   . Dilated aortic root ([facility])    25mm by CTA 07/2018  . Fatty liver   . Hypertension   . Morbid obesity with BMI of 40.0-44.9, adult ([place])   . OSA (obstructive sleep apnea)    mild with AHI 11/hr now on CPAP at 20cm H2O    Patient Active Problem List   Diagnosis Date Noted  . Dilated aortic root ([place])   . Plantar fasciitis 06/01/2018  . Hypokalemia 02/17/2016  . Chest pain 02/16/2016  . Morbid obesity with BMI of 40.0-44.9, adult ([place]) 05/15/2013  . Fatty liver   . OSA (obstructive sleep apnea)   . Hypertension   . Diabetes mellitus without complication ([place])   . Asthma     Past Surgical History:  Procedure Laterality Date  . ABDOMINAL HYSTERECTOMY    . BACK SURGERY     l4-5  . CARPAL TUNNEL RELEASE     right  . SHOULDER SURGERY    . SPINAL CORD STIMULATOR IMPLANT      OB History   No obstetric history on file.      Home Medications    Prior to Admission medications   Medication Sig Start Date End Date Taking? Authorizing Provider  amLODipine (NORVASC) 10 MG tablet Take 10 mg by  mouth every morning.  04/11/14   [provider]  aspirin EC 81 MG tablet Take 1 tablet (81 mg total) by mouth daily. 01/08/17   Lyda Jester M, PA-C  Aspirin-Acetaminophen-Caffeine (GOODY HEADACHE PO) Take 1 Package by mouth as needed (headache).    [provider]  Calcium Carbonate-Vitamin D (CALCIUM 600 + D PO) Take 2 tablets by mouth every morning.     [provider]  carboxymethylcellulose (REFRESH PLUS) 0.5 % SOLN Place 1 drop into both eyes 3 (three) times daily as needed (dry eyes).    [provider]  clotrimazole-betamethasone (LOTRISONE) cream Apply 1 application topically 2 (two) times daily. 05/30/18   Trula Slade, DPM  diclofenac sodium (VOLTAREN) 1 % GEL Apply 2 g topically 4 (four) times daily. Rub into affected area of foot 2 to 4 times daily 05/30/18   Trula Slade, DPM  escitalopram (LEXAPRO) 10 MG tablet Take 10 mg by mouth daily.  02/22/17   [provider]  Eszopiclone 3 MG TABS Take 3 mg by mouth at bedtime as needed (sleep). Take immediately before bedtime  [provider]  glimepiride (AMARYL) 4 MG tablet Take 4 mg by mouth daily. 11/24/16   [provider]  metFORMIN (GLUCOPHAGE-XR) 500 MG 24 hr tablet Take 2,000 mg by mouth daily with supper.    [provider]  methocarbamol (ROBAXIN) 750 MG tablet Take 1 tablet (750 mg total) by mouth 3 (three) times daily. 04/23/18   Rodriguez-Southworth, Sunday Spillers, PA-C  metoprolol (TOPROL-XL) 200 MG 24 hr tablet Take 200 mg by mouth every evening.     [provider]    Family History Family History  Problem Relation Age of Onset  . Hypertension Mother   . Diabetes Mother   . Lymphoma Mother   . Breast cancer Mother   . Hypertension Father   . Diabetes Father   . Breast cancer Maternal Grandmother     Social History Social History   Tobacco Use  . Smoking status: Former Research scientist (life sciences)  . Smokeless tobacco: Never Used  Substance  Use Topics  . Alcohol use: Yes    Comment: socially   . Drug use: No     Allergies   Patient has no known allergies.   Review of Systems Review of Systems  Constitutional: Negative for chills and fever.  HENT: Negative for ear pain and sore throat.   Eyes: Negative for pain and visual disturbance.  Respiratory: Negative for cough and shortness of breath.   Cardiovascular: Negative for chest pain and palpitations.  Gastrointestinal: Positive for abdominal pain. Negative for vomiting.  Genitourinary: Negative for dysuria and hematuria.  Musculoskeletal: Negative for arthralgias and back pain.  Skin: Negative for color change and rash.  Neurological: Negative for seizures and syncope.  All other systems reviewed and are negative.    Physical Exam Triage Vital Signs ED Triage Vitals  Enc Vitals Group     BP 02/09/19 1534 (!) 149/91     Pulse Rate 02/09/19 1534 83     Resp 02/09/19 1534 18     Temp 02/09/19 1534 98.6 F (37 C)     Temp Source 02/09/19 1534 Oral     SpO2 02/09/19 1534 100 %     Weight --      Height --      Head Circumference --      Peak Flow --      Pain Score 02/09/19 1533 8     Pain Loc --      Pain Edu? --      Excl. in [place]? --    No data found.  Updated Vital Signs BP (!) 149/91 (BP Location: Left Arm)   Pulse 83   Temp 98.6 F (37 C) (Oral)   Resp 18   SpO2 100%      Physical Exam Constitutional:      General: She is not in acute distress.    Appearance: She is well-developed. She is obese.  HENT:     Head: Normocephalic and atraumatic.  Eyes:     Conjunctiva/sclera: Conjunctivae normal.     Pupils: Pupils are equal, round, and reactive to light.  Neck:     Musculoskeletal: Normal range of motion.  Cardiovascular:     Rate and Rhythm: Normal rate and regular rhythm.     Heart sounds: Normal heart sounds.  Pulmonary:     Effort: Pulmonary effort is normal. No respiratory distress.     Breath sounds: Normal breath sounds.   Abdominal:     General: Bowel sounds are normal. There is no distension.  Palpations: Abdomen is soft. There is no hepatomegaly, splenomegaly or mass.     Tenderness: There is no abdominal tenderness. There is no right CVA tenderness or left CVA tenderness.     Comments: There is no tenderness in the area that the patient describes is painful.  No tenderness with pressure on the ribs.  Musculoskeletal: Normal range of motion.  Skin:    General: Skin is warm and dry.  Neurological:     Mental Status: She is alert.      UC Treatments / Results  Labs (all labs ordered are listed, but only abnormal results are displayed) Labs Reviewed - No data to display  EKG   Radiology Dg Abd Acute W/chest  Result Date: 02/09/2019 CLINICAL DATA:  Abdominal pain EXAM: DG ABDOMEN ACUTE W/ 1V CHEST COMPARISON:  Chest radiograph February 16, 2016 FINDINGS: PA chest: Lungs are clear. Heart is upper normal in size with pulmonary vascularity normal. No adenopathy. Thoracic stimulator lead tips are in the lower thoracic region, stable. Supine and upright abdomen: There is moderate stool in the colon. There is no bowel dilatation or air-fluid level to suggest bowel obstruction. There are phleboliths in the pelvis. There is postoperative change at L4 and L5. Stimulator device is located in the right pelvic region. IMPRESSION: Moderate stool in colon. No bowel obstruction or free air. Postoperative changes noted. No edema or consolidation.  Heart upper normal in size. Electronically Signed   By: Lowella Grip III M.D.   On: 02/09/2019 17:15    Procedures Procedures (including critical care time)  Medications Ordered in UC Medications - No data to display  Initial Impression / Assessment and Plan / UC Course  I have reviewed the triage vital signs and the nursing notes.  Pertinent labs & imaging results that were available during my care of the patient were reviewed by me and considered in my medical  decision making (see chart for details).     Patient has normal x-ray.  Excess stool.  Normal EKG.  With normal intervals, unchanged from prior, no ST or T wave changes.  Her exam is benign.  I believe she probably has a colon disorder with some Gasser constipation.  We will go to work on better diet, high-fiber, more water, probiotics.  She will call her PCP if not better in a few days.  Welcome to come back here if she is worse Final Clinical Impressions(s) / UC Diagnoses   Final diagnoses:  Left upper quadrant pain  Constipation, unspecified constipation type     Discharge Instructions     Increase water in your diet Increase fiber in your diet Consider adding MiraLAX or Metamucil daily Consider adding a probiotic daily I expect to see improvement over the next couple days You may call or return for problems.  Call your doctor if not better by Monday    ED Prescriptions    None     Controlled Substance Prescriptions  Controlled Substance Registry consulted? Not Applicable   Raylene Everts, MD 02/09/19 [phone]

## 2019-02-09 NOTE — Discharge Instructions (Addendum)
Increase water in your diet Increase fiber in your diet Consider adding MiraLAX or Metamucil daily Consider adding a probiotic daily I expect to see improvement over the next couple days You may call or return for problems.  Call your doctor if not better by Monday

## 2019-03-03 ENCOUNTER — Encounter (HOSPITAL_COMMUNITY): Payer: Self-pay

## 2019-03-03 ENCOUNTER — Ambulatory Visit (HOSPITAL_COMMUNITY)
Admission: EM | Admit: 2019-03-03 | Discharge: 2019-03-03 | Disposition: A | Discharge status: Home / Self Care | Payer: Medicare Other | Attending: Family Medicine | Admitting: Family Medicine

## 2019-03-03 ENCOUNTER — Other Ambulatory Visit: Payer: Self-pay

## 2019-03-03 ENCOUNTER — Ambulatory Visit (INDEPENDENT_AMBULATORY_CARE_PROVIDER_SITE_OTHER): Payer: Medicare Other

## 2019-03-03 DIAGNOSIS — M545 Low back pain, unspecified: Secondary | ICD-10-CM

## 2019-03-03 DIAGNOSIS — T148XXA Other injury of unspecified body region, initial encounter: Secondary | ICD-10-CM

## 2019-03-03 DIAGNOSIS — S3991XA Unspecified injury of abdomen, initial encounter: Secondary | ICD-10-CM | POA: Diagnosis not present

## 2019-03-03 MED ORDER — OXYCODONE-ACETAMINOPHEN 5-325 MG PO TABS: 2.0000 | ORAL_TABLET | ORAL | 0 refills | Status: DC | PRN

## 2019-03-03 MED ORDER — TIZANIDINE HCL 4 MG PO TABS: 4.0000 mg | ORAL_TABLET | Freq: Four times a day (QID) | ORAL | 0 refills | Status: DC | PRN
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
## 2019-03-03 NOTE — Discharge Instructions (Signed)
Take ibuprofen for mild to moderate pain.  An alternative would be Aleve 2 pills every 8 hours. Try tizanidine as a muscle relaxer.  This is useful at bedtime. Take oxycodone if needed for severe pain.  Do not drive on oxycodone.  Remember that oxycodone can cause constipation Rest.  Use ice or heat on your back. Follow-up with your personal physician or back specialist if you fail to improve

## 2019-03-13 DIAGNOSIS — M25512 Pain in left shoulder: Secondary | ICD-10-CM | POA: Diagnosis not present

## 2019-03-13 DIAGNOSIS — S39012A Strain of muscle, fascia and tendon of lower back, initial encounter: Secondary | ICD-10-CM | POA: Diagnosis not present

## 2019-03-13 DIAGNOSIS — S29019A Strain of muscle and tendon of unspecified wall of thorax, initial encounter: Secondary | ICD-10-CM | POA: Diagnosis not present

## 2019-03-13 DIAGNOSIS — S134XXA Sprain of ligaments of cervical spine, initial encounter: Secondary | ICD-10-CM | POA: Diagnosis not present

## 2019-03-13 DIAGNOSIS — M542 Cervicalgia: Secondary | ICD-10-CM | POA: Diagnosis not present

## 2019-03-13 DIAGNOSIS — M545 Low back pain: Secondary | ICD-10-CM | POA: Diagnosis not present

## 2019-03-23 DIAGNOSIS — M545 Low back pain: Secondary | ICD-10-CM | POA: Diagnosis not present

## 2019-03-23 DIAGNOSIS — M542 Cervicalgia: Secondary | ICD-10-CM | POA: Diagnosis not present

## 2019-03-23 DIAGNOSIS — M25512 Pain in left shoulder: Secondary | ICD-10-CM | POA: Diagnosis not present

## 2019-03-30 DIAGNOSIS — M542 Cervicalgia: Secondary | ICD-10-CM | POA: Diagnosis not present

## 2019-03-30 DIAGNOSIS — M545 Low back pain: Secondary | ICD-10-CM | POA: Diagnosis not present

## 2019-03-30 DIAGNOSIS — M25512 Pain in left shoulder: Secondary | ICD-10-CM | POA: Diagnosis not present

## 2019-04-03 DIAGNOSIS — M545 Low back pain: Secondary | ICD-10-CM | POA: Diagnosis not present

## 2019-04-03 DIAGNOSIS — M25512 Pain in left shoulder: Secondary | ICD-10-CM | POA: Diagnosis not present

## 2019-04-03 DIAGNOSIS — M542 Cervicalgia: Secondary | ICD-10-CM | POA: Diagnosis not present

## 2019-04-05 DIAGNOSIS — M25512 Pain in left shoulder: Secondary | ICD-10-CM | POA: Diagnosis not present

## 2019-04-05 DIAGNOSIS — M542 Cervicalgia: Secondary | ICD-10-CM | POA: Diagnosis not present

## 2019-04-05 DIAGNOSIS — M545 Low back pain: Secondary | ICD-10-CM | POA: Diagnosis not present

## 2019-04-14 DIAGNOSIS — M25512 Pain in left shoulder: Secondary | ICD-10-CM | POA: Diagnosis not present

## 2019-04-14 DIAGNOSIS — M545 Low back pain: Secondary | ICD-10-CM | POA: Diagnosis not present

## 2019-04-14 DIAGNOSIS — M542 Cervicalgia: Secondary | ICD-10-CM | POA: Diagnosis not present

## 2019-04-24 DIAGNOSIS — M545 Low back pain: Secondary | ICD-10-CM | POA: Diagnosis not present

## 2019-04-24 DIAGNOSIS — G894 Chronic pain syndrome: Secondary | ICD-10-CM | POA: Diagnosis not present

## 2019-04-24 DIAGNOSIS — T849XXA Unspecified complication of internal orthopedic prosthetic device, implant and graft, initial encounter: Secondary | ICD-10-CM | POA: Diagnosis not present

## 2019-04-24 DIAGNOSIS — Z6841 Body Mass Index (BMI) 40.0 and over, adult: Secondary | ICD-10-CM | POA: Diagnosis not present

## 2019-04-25 DIAGNOSIS — G4733 Obstructive sleep apnea (adult) (pediatric): Secondary | ICD-10-CM | POA: Diagnosis not present

## 2019-04-25 DIAGNOSIS — Z6841 Body Mass Index (BMI) 40.0 and over, adult: Secondary | ICD-10-CM | POA: Diagnosis not present

## 2019-04-25 DIAGNOSIS — J4520 Mild intermittent asthma, uncomplicated: Secondary | ICD-10-CM | POA: Diagnosis not present

## 2019-04-25 DIAGNOSIS — L304 Erythema intertrigo: Secondary | ICD-10-CM | POA: Diagnosis not present

## 2019-04-25 DIAGNOSIS — E785 Hyperlipidemia, unspecified: Secondary | ICD-10-CM | POA: Diagnosis not present

## 2019-04-25 DIAGNOSIS — I517 Cardiomegaly: Secondary | ICD-10-CM | POA: Diagnosis not present

## 2019-04-25 DIAGNOSIS — F331 Major depressive disorder, recurrent, moderate: Secondary | ICD-10-CM | POA: Diagnosis not present

## 2019-04-25 DIAGNOSIS — I10 Essential (primary) hypertension: Secondary | ICD-10-CM | POA: Diagnosis not present

## 2019-04-25 DIAGNOSIS — E1140 Type 2 diabetes mellitus with diabetic neuropathy, unspecified: Secondary | ICD-10-CM | POA: Diagnosis not present

## 2019-05-01 DIAGNOSIS — E785 Hyperlipidemia, unspecified: Secondary | ICD-10-CM | POA: Diagnosis not present

## 2019-05-01 DIAGNOSIS — E1140 Type 2 diabetes mellitus with diabetic neuropathy, unspecified: Secondary | ICD-10-CM | POA: Diagnosis not present

## 2019-05-01 DIAGNOSIS — M25512 Pain in left shoulder: Secondary | ICD-10-CM | POA: Diagnosis not present

## 2019-05-01 DIAGNOSIS — I10 Essential (primary) hypertension: Secondary | ICD-10-CM | POA: Diagnosis not present

## 2019-05-03 ENCOUNTER — Other Ambulatory Visit: Payer: Self-pay | Admitting: Orthopedic Surgery

## 2019-05-03 DIAGNOSIS — M25512 Pain in left shoulder: Secondary | ICD-10-CM

## 2019-05-05 DIAGNOSIS — M25512 Pain in left shoulder: Secondary | ICD-10-CM | POA: Diagnosis not present

## 2019-05-05 DIAGNOSIS — M542 Cervicalgia: Secondary | ICD-10-CM | POA: Diagnosis not present

## 2019-05-05 DIAGNOSIS — M545 Low back pain: Secondary | ICD-10-CM | POA: Diagnosis not present

## 2019-05-16 ENCOUNTER — Ambulatory Visit
Admission: RE | Admit: 2019-05-16 | Discharge: 2019-05-16 | Disposition: A | Discharge status: Home / Self Care | Payer: Medicare Other | Source: Ambulatory Visit | Attending: Orthopedic Surgery | Admitting: Orthopedic Surgery

## 2019-05-16 ENCOUNTER — Other Ambulatory Visit: Payer: Self-pay

## 2019-05-16 ENCOUNTER — Ambulatory Visit
Admission: RE | Admit: 2019-05-16 | Discharge: 2019-05-16 | Disposition: A | Discharge status: Home / Self Care | Payer: Managed Care, Other (non HMO) | Source: Ambulatory Visit | Attending: Orthopedic Surgery | Admitting: Orthopedic Surgery

## 2019-05-16 DIAGNOSIS — M25512 Pain in left shoulder: Secondary | ICD-10-CM

## 2019-05-16 DIAGNOSIS — M542 Cervicalgia: Secondary | ICD-10-CM | POA: Diagnosis not present

## 2019-05-16 DIAGNOSIS — M545 Low back pain: Secondary | ICD-10-CM | POA: Diagnosis not present

## 2019-05-16 MED ORDER — IOPAMIDOL (ISOVUE-M 200) INJECTION 41%
12.0000 mL | Freq: Once | INTRAMUSCULAR | Status: AC
  Administered 2019-05-16: 12 mL via INTRA_ARTICULAR

## 2019-05-17 DIAGNOSIS — M7542 Impingement syndrome of left shoulder: Secondary | ICD-10-CM | POA: Diagnosis not present

## 2019-05-17 DIAGNOSIS — M25512 Pain in left shoulder: Secondary | ICD-10-CM | POA: Diagnosis not present

## 2019-05-17 DIAGNOSIS — M19012 Primary osteoarthritis, left shoulder: Secondary | ICD-10-CM | POA: Diagnosis not present

## 2019-05-19 DIAGNOSIS — M542 Cervicalgia: Secondary | ICD-10-CM | POA: Diagnosis not present

## 2019-05-19 DIAGNOSIS — M25512 Pain in left shoulder: Secondary | ICD-10-CM | POA: Diagnosis not present

## 2019-05-19 DIAGNOSIS — M545 Low back pain: Secondary | ICD-10-CM | POA: Diagnosis not present

## 2019-05-31 DIAGNOSIS — M24112 Other articular cartilage disorders, left shoulder: Secondary | ICD-10-CM | POA: Diagnosis not present

## 2019-05-31 DIAGNOSIS — M7552 Bursitis of left shoulder: Secondary | ICD-10-CM | POA: Diagnosis not present

## 2019-05-31 DIAGNOSIS — M19012 Primary osteoarthritis, left shoulder: Secondary | ICD-10-CM | POA: Diagnosis not present

## 2019-05-31 DIAGNOSIS — M7542 Impingement syndrome of left shoulder: Secondary | ICD-10-CM | POA: Diagnosis not present

## 2019-05-31 DIAGNOSIS — M7522 Bicipital tendinitis, left shoulder: Secondary | ICD-10-CM | POA: Diagnosis not present

## 2019-06-22 DIAGNOSIS — M25512 Pain in left shoulder: Secondary | ICD-10-CM | POA: Diagnosis not present

## 2019-06-29 DIAGNOSIS — M25512 Pain in left shoulder: Secondary | ICD-10-CM | POA: Diagnosis not present

## 2019-07-03 DIAGNOSIS — M25512 Pain in left shoulder: Secondary | ICD-10-CM | POA: Diagnosis not present

## 2019-07-04 ENCOUNTER — Other Ambulatory Visit: Payer: Self-pay | Admitting: Obstetrics and Gynecology

## 2019-07-04 DIAGNOSIS — Z1231 Encounter for screening mammogram for malignant neoplasm of breast: Secondary | ICD-10-CM

## 2019-07-06 DIAGNOSIS — M25512 Pain in left shoulder: Secondary | ICD-10-CM | POA: Diagnosis not present

## 2019-07-06 DIAGNOSIS — Z1231 Encounter for screening mammogram for malignant neoplasm of breast: Secondary | ICD-10-CM

## 2019-07-10 DIAGNOSIS — M25512 Pain in left shoulder: Secondary | ICD-10-CM | POA: Diagnosis not present

## 2019-07-12 DIAGNOSIS — Z4789 Encounter for other orthopedic aftercare: Secondary | ICD-10-CM | POA: Diagnosis not present

## 2019-07-12 DIAGNOSIS — M25512 Pain in left shoulder: Secondary | ICD-10-CM | POA: Diagnosis not present

## 2019-07-20 DIAGNOSIS — M25512 Pain in left shoulder: Secondary | ICD-10-CM | POA: Diagnosis not present

## 2019-07-21 ENCOUNTER — Telehealth: Payer: Self-pay | Admitting: *Deleted

## 2019-07-21 NOTE — Telephone Encounter (Signed)
Please call pt to schedule appt for clearance eith with Dr. Radford Pax or APP per Rosaria Ferries, PA.

## 2019-07-21 NOTE — Telephone Encounter (Signed)
Faxed successfully to requesting office via Epic fax function. 

## 2019-07-21 NOTE — Telephone Encounter (Signed)
   Primary Cardiologist:Traci Turner, MD 11/30/2017  Chart reviewed as part of pre-operative protocol coverage. Because of SAMYA ANGELL Campbell's past medical history and time since last visit, he/she will require a follow-up visit in order to better assess preoperative cardiovascular risk.  Pre-op covering staff: - Please schedule appointment and call patient to inform them. - Please contact requesting surgeon's office via preferred method (i.e, phone, fax) to inform them of need for appointment prior to surgery.  If applicable, this message will also be routed to pharmacy pool and/or primary cardiologist for input on holding anticoagulant/antiplatelet agent as requested below so that this information is available at time of patient's appointment.   Rosaria Ferries, PA-C  07/21/2019, 2:46 PM

## 2019-07-21 NOTE — Telephone Encounter (Signed)
   [facility] Pre-operative Risk Assessment    Request for surgical clearance:  1. What type of surgery is being performed? SPINAL CORD STIMULATOR BATTERY REPLACEMENT   2. When is this surgery scheduled? 08/04/19   3. What type of clearance is required (medical clearance vs. Pharmacy clearance to hold med vs. Both)? MEDICAL  4. Are there any medications that need to be held prior to surgery and how long? ASA    5. Practice name and name of physician performing surgery? [facility]; DR. Aguada   6. What is your office phone number [phone]    7.   What is your office fax number [phone] ATTN: SHERRY WILLS  8.   Anesthesia type (None, local, MAC, general) ? GENERAL   [name] 07/21/2019, 12:16 PM  _________________________________________________________________   (provider comments below)

## 2019-07-25 NOTE — Telephone Encounter (Signed)
Pt has been scheduled to see Pecolia Ades, NP 07/27/19 @ 8:45 am. I will send clearance note to NP for upcoming appt. Will send FYI to surgeon Dr. Melina Schools pt has upcoming appt. Will remove from the pre op call back pool.

## 2019-07-26 NOTE — Progress Notes (Addendum)
Cardiology Office Note  Date: 07/27/2019   ID: [name], DOB 06-22-1965, MRN [id number]  PCP:  Maurice Small, MD  Cardiologist:  Fransico Him, MD Electrophysiologist:  None   No chief complaint on file.   History of Present Illness: [name] is a 54 y.o. female with history of hypertension, diabetes type 2, prior tobacco use, family history of CAD, obesity, sleep apnea, asthma.  Patient needs clearance for replacement of spinal cord stimulator battery.  Patient had admission in 2017 in September for chest pain.  She was ruled out for MI and had a negative nuclear stress test as well as a normal 2D echo.  EF was 55 to 60%.  She has obstructive sleep apnea and currently using CPAP  Patient last saw Dr. Radford Pax via office visit and November 30, 2017.  A cardiac CT was ordered.  Mildly dilated aortic root 3.8 cm, Calcium score <1 which is 83 rd percentile for age as most peoplein this age range have scores of 0,  Normal right dominant coronary arteries   Patient denies any recent acute illnesses, hospitalizations, travels.  She is really pending surgery for valve replacement for spinal cord stimulator.  She denies any recent progressive anginal or exertional symptoms, palpitations or arrhythmias, orthostatic symptoms/syncopal or near syncopal symptoms.  Denies any CVA or TIA-like symptoms, dyspeptic symptoms, or blood in stool or urine.  Denies any DVT or PE-like symptoms, or lower extremity edema.  Denies any claudication-like symptoms.  He does complain of some right upper outer leg pain over the last 2 weeks aggravated by sitting and somewhat relieved by standing and ambulating.  He has chronic lower back pain and has a spinal cord stimulator.  Past Medical History:  Diagnosis Date  . Asthma   . Diabetes mellitus without complication ([place])   . Dilated aortic root (HCC)    25mm by CTA 07/2018  . Fatty liver   . Hypertension   . Morbid obesity with BMI of  40.0-44.9, adult ([place])   . OSA (obstructive sleep apnea)    mild with AHI 11/hr now on CPAP at 20cm H2O    Past Surgical History:  Procedure Laterality Date  . ABDOMINAL HYSTERECTOMY    . BACK SURGERY     l4-5  . CARPAL TUNNEL RELEASE     right  . SHOULDER SURGERY    . SPINAL CORD STIMULATOR IMPLANT      Current Outpatient Medications  Medication Sig Dispense Refill  . amLODipine (NORVASC) 10 MG tablet Take 10 mg by mouth every morning.     Marland Kitchen aspirin EC 81 MG tablet Take 1 tablet (81 mg total) by mouth daily.    . Aspirin-Acetaminophen-Caffeine (GOODY HEADACHE PO) Take 1 Package by mouth as needed (headache).    . Calcium Carbonate-Vitamin D (CALCIUM 600 + D PO) Take 2 tablets by mouth every morning.     . carboxymethylcellulose (REFRESH PLUS) 0.5 % SOLN Place 1 drop into both eyes 3 (three) times daily as needed (dry eyes).    Marland Kitchen diclofenac sodium (VOLTAREN) 1 % GEL Apply 2 g topically 4 (four) times daily. Rub into affected area of foot 2 to 4 times daily 100 g 2  . Eszopiclone 3 MG TABS Take 3 mg by mouth at bedtime as needed (sleep). Take immediately before bedtime     . glimepiride (AMARYL) 4 MG tablet Take 4 mg by mouth daily.  11  . meloxicam (MOBIC) 15 MG tablet Take  15 mg by mouth as needed.    . metFORMIN (GLUCOPHAGE-XR) 500 MG 24 hr tablet Take 2,000 mg by mouth daily with supper.    . metoprolol (TOPROL-XL) 200 MG 24 hr tablet Take 200 mg by mouth every evening.     Glory Rosebush ULTRA test strip 1 each by Other route as needed.    Marland Kitchen oxyCODONE (OXY IR/ROXICODONE) 5 MG immediate release tablet Take 5 mg by mouth every 6 (six) hours as needed.    Marland Kitchen oxyCODONE-acetaminophen (PERCOCET/ROXICET) 5-325 MG tablet Take 2 tablets by mouth every 4 (four) hours as needed for severe pain. 15 tablet 0  . tiZANidine (ZANAFLEX) 4 MG tablet Take 1-2 tablets (4-8 mg total) by mouth every 6 (six) hours as needed for muscle spasms. 21 tablet 0   No current facility-administered medications for  this visit.   Allergies:  Patient has no known allergies.   Social History: The patient  reports that she has quit smoking. She has never used smokeless tobacco. She reports current alcohol use. She reports that she does not use drugs.   Family History: The patient's family history includes Breast cancer in her maternal grandmother and mother; Diabetes in her father and mother; Hypertension in her father and mother; Lymphoma in her mother.   ROS:  Please see the history of present illness. Otherwise, complete review of systems is positive for none.  All other systems are reviewed and negative.   Physical Exam: VS:  There were no vitals taken for this visit., BMI There is no height or weight on file to calculate BMI.  Wt Readings from Last 3 Encounters:  11/30/17 258 lb 6.4 oz (117.2 kg)  02/25/17 258 lb 12.8 oz (117.4 kg)  01/08/17 254 lb 12.8 oz (115.6 kg)    General: Patient appears comfortable at rest. Neck: Supple, no elevated JVP or carotid bruits, no thyromegaly. Lungs: Clear to auscultation, nonlabored breathing at rest. Cardiac: Regular rate and rhythm, no S3 or significant systolic murmur, no pericardial rub. Extremities: No pitting edema, distal pulses 2+. Skin: Warm and dry. Musculoskeletal: No kyphosis. Neuropsychiatric: Alert and oriented x3, affect grossly appropriate.  ECG:  An ECG dated 07/27/2019 was personally reviewed today and demonstrated:  NSR rate of 68. No acute ST or T wave changes noted  Recent Labwork: No results found for requested labs within last 8760 hours.  No results found for: CHOL, TRIG, HDL, CHOLHDL, VLDL, LDLCALC, LDLDIRECT  Other Studies Reviewed Today:  Coronary CT performed on August 11, 2018 FINDINGS: IMPRESSION: 1.  Mildly dilated aortic root 3.8 cm 2. Calcium score <1 which is 108 rd percentile for age as most people in this age range have scores of 0 3.  Normal right dominant coronary arteries   Non cardiac findings 4 mm  noncalcified pulmonary nodule is seen in the right middle lobe on image 12/13. No other suspicious pulmonary nodules seen in visualized portions of the lower lung fields. The visualized portions of the mediastinum and chest wall are unremarkable.  IMPRESSION: 4 mm indeterminate pulmonary nodule in right middle lobe. No follow-up needed if patient is low-risk. Non-contrast chest CT can be considered in 12 months if patient is high-risk. This recommendation follows the consensus statement: Guidelines for Management of Incidental Pulmonary Nodules Detected on CT Images: From the Fleischner Society 2017; Radiology 2017; 284:228-243.  Echocardiogram 02/18/2016 Study Conclusions   - Left ventricle: The cavity size was normal. Systolic function was  normal. The estimated ejection fraction was in the range  of 55%  to 60%. Wall motion was normal; there were no regional wall  motion abnormalities. Left ventricular diastolic function  parameters were normal.  - Atrial septum: No defect or patent foramen ovale was identified  Lexiscan Myoview stress test 02/17/2016  FINDINGS: Perfusion: No decreased activity in the left ventricle on stress imaging to suggest reversible ischemia or infarction. Wall Motion: Lateral wall hypokinesis identified. Left Ventricular Ejection Fraction: 49 % End diastolic volume [id number] ml End systolic volume 65 ml  IMPRESSION: 1. No reversible ischemia or infarction. 2. Moderate left ventricular dilatation with lateral wall hypokinesis. 3. Left ventricular ejection fraction 49% 4. Non invasive risk stratification*: Intermediate   Assessment and Plan:  1. Preoperative clearance   2. Essential hypertension   3. OSA (obstructive sleep apnea)   4. Morbid obesity with BMI of 40.0-44.9, adult ([place])   5. Dilated aortic root ([place])    1. Preoperative clearance Patient needs preop clearance for placement of spinal cord stimulator battery. RCRI risk  score of 0.4 %. Patient would be considered a low risk for spinal cord stimulator battery replacement from a cardiac standpoint. No further cardiac testing needed.  2. Essential hypertension BP 142/90. Measure BP at home and call if BP remains sustained > 130/80. Continue Amlodipine 10 mg daily, Metoprolol XL 200 mg daily  3. OSA (obstructive sleep apnea) On CPAP and compliant.Continue therapy. Patient states her CPAP device is old and she feels it doesn't work well any more and wants to see if she can get a new one. Will have message sent to Dr. Landis Gandy office per her request for new device.  4. Morbid obesity with BMI of 40.0-44.9, adult (HCC) Concentrate on weight loss measures such as exercising at least 150 min /wk. Watch portion size. Reduce saturate fats and simple carbohydrates.   5. Dilated aorttic root Mild dilated aortic root 3.8 cm on recent Coronary CTA 08/11/2018 .Repeat CCTA  for evaluation.  Medication Adjustments/Labs and Tests Ordered: Current medicines are reviewed at length with the patient today.  Concerns regarding medicines are outlined above.    There are no Patient Instructions on file for this visit.       Signed, Levell July, NP 07/27/2019 8:59 AM    [place]

## 2019-07-27 ENCOUNTER — Encounter: Payer: Self-pay | Admitting: Cardiology

## 2019-07-27 ENCOUNTER — Ambulatory Visit (INDEPENDENT_AMBULATORY_CARE_PROVIDER_SITE_OTHER): Payer: Managed Care, Other (non HMO) | Admitting: Family Medicine

## 2019-07-27 ENCOUNTER — Other Ambulatory Visit: Payer: Self-pay

## 2019-07-27 VITALS — BP 142/90 | HR 65 | Ht 66.0 in | Wt 248.0 lb

## 2019-07-27 DIAGNOSIS — Z6841 Body Mass Index (BMI) 40.0 and over, adult: Secondary | ICD-10-CM

## 2019-07-27 DIAGNOSIS — G4733 Obstructive sleep apnea (adult) (pediatric): Secondary | ICD-10-CM

## 2019-07-27 DIAGNOSIS — I77810 Thoracic aortic ectasia: Secondary | ICD-10-CM | POA: Diagnosis not present

## 2019-07-27 DIAGNOSIS — Z01818 Encounter for other preprocedural examination: Secondary | ICD-10-CM

## 2019-07-27 DIAGNOSIS — I10 Essential (primary) hypertension: Secondary | ICD-10-CM

## 2019-07-27 NOTE — Patient Instructions (Signed)
Medication Instructions:  Your physician recommends that you continue on your current medications as directed. Please refer to the Current Medication list given to you today.  *If you need a refill on your cardiac medications before your next appointment, please call your pharmacy*  Lab Work: None ordered   If you have labs (blood work) drawn today and your tests are completely normal, you will receive your results only by: Marland Kitchen MyChart Message (if you have MyChart) OR . A paper copy in the mail If you have any lab test that is abnormal or we need to change your treatment, we will call you to review the results.  Testing/Procedures: Your physician has requested that you have an echocardiogram. Echocardiography is a painless test that uses sound waves to create images of your heart. It provides your doctor with information about the size and shape of your heart and how well your heart's chambers and valves are working. This procedure takes approximately one hour. There are no restrictions for this procedure.   Follow-Up: At [facility], you and your health needs are our priority.  As part of our continuing mission to provide you with exceptional heart care, we have created designated Provider Care Teams.  These Care Teams include your primary Cardiologist (physician) and Advanced Practice Providers (APPs -  Physician Assistants and Nurse Practitioners) who all work together to provide you with the care you need, when you need it.  Your next appointment:   3 month(s)  The format for your next appointment:   In Person  Provider:   You may see Fransico Him, MD or one of the following Advanced Practice Providers on your designated Care Team:    Melina Copa, PA-C  Ermalinda Barrios, PA-C   Other Instructions None

## 2019-07-31 DIAGNOSIS — M25512 Pain in left shoulder: Secondary | ICD-10-CM | POA: Diagnosis not present

## 2019-08-01 NOTE — Telephone Encounter (Signed)
Seth Bake from [facility] is calling to follow up on the patient's cardiac clearance due to not receiving or hearing anything in regards to it since the patients pre op appointment on 07/27/19. Clearance can be faxed over to [phone] with "ATTN: SHERRY WILLS".

## 2019-08-02 DIAGNOSIS — E1140 Type 2 diabetes mellitus with diabetic neuropathy, unspecified: Secondary | ICD-10-CM | POA: Diagnosis not present

## 2019-08-02 NOTE — Telephone Encounter (Signed)
I will re-fax [name]'s note from 07/27/19.

## 2019-08-04 DIAGNOSIS — Z4542 Encounter for adjustment and management of neuropacemaker (brain) (peripheral nerve) (spinal cord): Secondary | ICD-10-CM | POA: Diagnosis not present

## 2019-08-10 ENCOUNTER — Ambulatory Visit (HOSPITAL_COMMUNITY): Payer: Managed Care, Other (non HMO) | Attending: Cardiovascular Disease

## 2019-08-10 ENCOUNTER — Other Ambulatory Visit: Payer: Self-pay

## 2019-08-10 DIAGNOSIS — G4730 Sleep apnea, unspecified: Secondary | ICD-10-CM | POA: Insufficient documentation

## 2019-08-10 DIAGNOSIS — Z01810 Encounter for preprocedural cardiovascular examination: Secondary | ICD-10-CM | POA: Diagnosis not present

## 2019-08-10 DIAGNOSIS — I119 Hypertensive heart disease without heart failure: Secondary | ICD-10-CM | POA: Diagnosis not present

## 2019-08-10 DIAGNOSIS — E119 Type 2 diabetes mellitus without complications: Secondary | ICD-10-CM | POA: Insufficient documentation

## 2019-08-10 DIAGNOSIS — I77810 Thoracic aortic ectasia: Secondary | ICD-10-CM | POA: Insufficient documentation

## 2019-08-10 DIAGNOSIS — Z8249 Family history of ischemic heart disease and other diseases of the circulatory system: Secondary | ICD-10-CM | POA: Diagnosis not present

## 2019-08-14 ENCOUNTER — Other Ambulatory Visit: Payer: Self-pay

## 2019-08-16 ENCOUNTER — Ambulatory Visit (INDEPENDENT_AMBULATORY_CARE_PROVIDER_SITE_OTHER): Payer: Managed Care, Other (non HMO) | Admitting: Internal Medicine

## 2019-08-16 ENCOUNTER — Other Ambulatory Visit: Payer: Self-pay

## 2019-08-16 ENCOUNTER — Encounter: Payer: Self-pay | Admitting: Internal Medicine

## 2019-08-16 VITALS — BP 152/98 | HR 77 | Temp 98.1°F | Ht 66.0 in | Wt 251.8 lb

## 2019-08-16 DIAGNOSIS — E1140 Type 2 diabetes mellitus with diabetic neuropathy, unspecified: Secondary | ICD-10-CM | POA: Insufficient documentation

## 2019-08-16 DIAGNOSIS — E785 Hyperlipidemia, unspecified: Secondary | ICD-10-CM | POA: Diagnosis not present

## 2019-08-16 DIAGNOSIS — E1165 Type 2 diabetes mellitus with hyperglycemia: Secondary | ICD-10-CM

## 2019-08-16 DIAGNOSIS — E1142 Type 2 diabetes mellitus with diabetic polyneuropathy: Secondary | ICD-10-CM

## 2019-08-16 LAB — GLUCOSE, POCT (MANUAL RESULT ENTRY): POC Glucose: 213 mg/dl — AB (ref 70–99)

## 2019-08-16 MED ORDER — TRULICITY 0.75 MG/0.5ML ~~LOC~~ SOAJ: 0.7500 mg | SUBCUTANEOUS | 2 refills | Status: DC

## 2019-08-16 MED ORDER — ONETOUCH ULTRA VI STRP: 1.0000 | ORAL_STRIP | 11 refills | Status: DC | PRN

## 2019-08-16 NOTE — Patient Instructions (Signed)
-   Start trulicity [id number] mg weekly  - Continue Glimepiride 4 mg daily  - Continue Metformin 500 mg ,4 tablet daily      - Check sugar fasting and bedtime    -HOW TO TREAT LOW BLOOD SUGARS (Blood sugar LESS THAN 70 MG/DL)  Please follow the RULE OF 15 for the treatment of hypoglycemia treatment (when your (blood sugars are less than 70 mg/dL)    STEP 1: Take 15 grams of carbohydrates when your blood sugar is low, which includes:   3-4 GLUCOSE TABS  OR  3-4 OZ OF JUICE OR REGULAR SODA OR  ONE TUBE OF GLUCOSE GEL     STEP 2: RECHECK blood sugar in 15 MINUTES STEP 3: If your blood sugar is still low at the 15 minute recheck --> then, go back to STEP 1 and treat AGAIN with another 15 grams of carbohydrates.

## 2019-08-16 NOTE — Progress Notes (Signed)
Name: [name]  MRN/ DOB: [id number], [birth year]   Age/ Sex: 54 y.o., female    PCP: Maurice Small, MD   Reason for Endocrinology Evaluation: Type 2 Diabetes Mellitus     Date of Initial Endocrinology Visit: 08/17/2019     PATIENT IDENTIFIER: [name] is a 54 y.o. female with a past medical history of T2DM, OSA, Asthma and cardiomegaly. The patient presented for initial endocrinology clinic visit on 08/17/2019 for consultative assistance with her diabetes management.    HPI: Ms. Tekayla Wickson was    Diagnosed with DM  15 yrs ago started as gestation ,treated with insulin for a short period, 5 yrs later was started on metformin , 2-3 yrs later SU was added  Currently checking blood sugars 0 x / day  Hypoglycemia episodes : no               Hemoglobin A1c has ranged from 8.4% in 04/2019, peaking at 9.6% in 09/2018. Patient required assistance for hypoglycemia: no  Patient has required hospitalization within the last 1 year from hyper or hypoglycemia: no   In terms of diet, the patient eats 2-3 meals a day, she is more of a snacker. She avoids sugar-sweetened beverages.   Sister with thyroid disease but no cancer    HOME DIABETES REGIMEN: Glimepiride 4 mg daily  Metformin 500 mg XR 4 tabs daily    Statin: No ACE-I/ARB:Allergic to lisinopril - cough  Prior Diabetic Education: long time ago    METER DOWNLOAD SUMMARY: Did not bring   DIABETIC COMPLICATIONS: Microvascular complications:   Neuropathy  Denies: CKD, retinopathy  Last eye exam: Completed 2019  Macrovascular complications:    Denies: CAD, PVD, CVA   PAST HISTORY: Past Medical History:  Past Medical History:  Diagnosis Date  . Asthma   . Diabetes mellitus without complication ([place])   . Dilated aortic root (HCC)    42mm by CTA 07/2018  . Fatty liver   . Hypertension   . Morbid obesity with BMI of 40.0-44.9, adult ([place])   . OSA (obstructive sleep apnea)    mild  with AHI 11/hr now on CPAP at 20cm H2O   Past Surgical History:  Past Surgical History:  Procedure Laterality Date  . ABDOMINAL HYSTERECTOMY    . BACK SURGERY     l4-5  . CARPAL TUNNEL RELEASE     right  . SHOULDER SURGERY    . SPINAL CORD STIMULATOR IMPLANT        Social History:  reports that she has quit smoking. She has never used smokeless tobacco. She reports current alcohol use. She reports that she does not use drugs. Family History:  Family History  Problem Relation Age of Onset  . Hypertension Mother   . Diabetes Mother   . Lymphoma Mother   . Breast cancer Mother   . Hypertension Father   . Diabetes Father   . Breast cancer Maternal Grandmother      HOME MEDICATIONS: Allergies as of 08/16/2019   No Known Allergies     Medication List       Accurate as of August 16, 2019 11:59 PM. If you have any questions, ask your nurse or doctor.        amLODipine 10 MG tablet Commonly known as: NORVASC Take 10 mg by mouth every morning.   aspirin EC 81 MG tablet Take 1 tablet (81 mg total) by mouth daily.   CALCIUM 600 + D  PO Take 2 tablets by mouth every morning.   carboxymethylcellulose 0.5 % Soln Commonly known as: REFRESH PLUS Place 1 drop into both eyes 3 (three) times daily as needed (dry eyes).   diclofenac sodium 1 % Gel Commonly known as: VOLTAREN Apply 2 g topically 4 (four) times daily. Rub into affected area of foot 2 to 4 times daily   Eszopiclone 3 MG Tabs Take 3 mg by mouth at bedtime as needed (sleep). Take immediately before bedtime   glimepiride 4 MG tablet Commonly known as: AMARYL Take 4 mg by mouth daily.   GOODY HEADACHE PO Take 1 Package by mouth as needed (headache).   meloxicam 15 MG tablet Commonly known as: MOBIC Take 15 mg by mouth as needed.   metFORMIN 500 MG 24 hr tablet Commonly known as: GLUCOPHAGE-XR Take 2,000 mg by mouth daily with supper.   metoprolol 200 MG 24 hr tablet Commonly known as: TOPROL-XL Take 200  mg by mouth every evening.   OneTouch Ultra test strip Generic drug: glucose blood 1 each by Other route as needed. To test blood sugar 2 times daily E11.65 What changed: additional instructions Changed by: Jeannetta Nap, CMA   oxyCODONE 5 MG immediate release tablet Commonly known as: Oxy IR/ROXICODONE Take 5 mg by mouth every 6 (six) hours as needed.   oxyCODONE-acetaminophen 5-325 MG tablet Commonly known as: PERCOCET/ROXICET Take 2 tablets by mouth every 4 (four) hours as needed for severe pain.   tiZANidine 4 MG tablet Commonly known as: Zanaflex Take 1-2 tablets (4-8 mg total) by mouth every 6 (six) hours as needed for muscle spasms.   Trulicity A999333 0000000 Sopn Generic drug: Dulaglutide Inject 0.75 mg into the skin once a week. Started by: Dorita Sciara, MD        ALLERGIES: No Known Allergies   REVIEW OF SYSTEMS: A comprehensive ROS was conducted with the patient and is negative except as per HPI   OBJECTIVE:   VITAL SIGNS: BP (!) 152/98 (BP Location: Right Arm, Patient Position: Sitting, Cuff Size: Normal)   Pulse 77   Temp 98.1 F (36.7 C)   Ht 5\' 6"  (1.676 m)   Wt 251 lb 12.8 oz (114.2 kg)   SpO2 97%   BMI 40.64 kg/m    PHYSICAL EXAM:  General: Pt appears well and is in NAD  Neck: General: Supple without adenopathy or carotid bruits. Thyroid: Thyroid size normal.  No goiter or nodules appreciated. No thyroid bruit.  Lungs: Clear with good BS bilat with no rales, rhonchi, or wheezes  Heart: RRR with normal S1 and S2 and no gallops; no murmurs; no rub  Abdomen: Normoactive bowel sounds, soft, nontender, without masses or organomegaly palpable  Extremities:  Lower extremities - No pretibial edema. No lesions.  Skin: Normal texture and temperature to palpation.   Neuro: MS is good with appropriate affect, pt is alert and Ox3    DM foot exam: 08/16/2019  The skin of the feet is intact without sores or ulcerations. The pedal pulses are 2+  on right and 2+ on left. The sensation is intact to a screening 5.07, 10 gram monofilament bilaterally   DATA REVIEWED:  No results found for: HGBA1C Lab Results  Component Value Date   CREATININE 0.66 06/29/2018  05/01/2019  BUN/Cr 13/0.79  GFR 76 TG 121 LDL 105    09/16/2018 Ma/Cr ratio 27       ASSESSMENT / PLAN / RECOMMENDATIONS:   1) Type 2 Diabetes Mellitus,  Poorly controlled, With Neuropathic complications - Most recent A1c of 10.7 %. Goal A1c < 7.0%.     A1c per pt   I have discussed with the patient the pathophysiology of diabetes. We went over the natural progression of the disease. We talked about both insulin resistance and insulin deficiency. We stressed the importance of lifestyle changes including diet and exercise. I explained the complications associated with diabetes including retinopathy, nephropathy, neuropathy as well as increased risk of cardiovascular disease. We went over the benefit seen with glycemic control.    I explained to the patient that diabetic patients are at higher than normal risk for amputations.   We discussed add-on therapy with GLP-1 agonists, after discussing the benefits and the risks of GI side effects, the patient agreed to start on Trulicity.  She has no history of pancreatitis, she has a sister with thyroid disease but no thyroid cancer.  MEDICATIONS: - Start trulicity [id number] mg weekly  - Continue Glimepiride 4 mg daily  - Continue Metformin 500 mg ,4 tablet daily   EDUCATION / INSTRUCTIONS:  BG monitoring instructions: Patient is instructed to check her blood sugars 2 times a day, fasting and bedtime 1 possible.  Call [facility] if: BG persistently < 70 or > 300. . I reviewed the Rule of 15 for the treatment of hypoglycemia in detail with the patient. Literature supplied.   2) Diabetic complications:   Eye: Does not have known diabetic retinopathy.  Patient urged to have an eye exam   n euro/ Feet:  Does have known diabetic peripheral neuropathy.  Renal: Patient does not have known baseline CKD. She is allergic to ACEI- cough . She is not on an ARB  at present.     3) Lipids: Patient is not on a statin. We discussed the cardiovascular benefits of statins. Will discuss this further on next visit    4) Hypertension: She is  at goal of < 140/90 mmHg.       Signed electronically by: Mack Guise, MD  [facility]  [facility] [address] [place], [place] [address] Phone: [phone] FAX: [phone]   CC: Maurice Small, MD [address] [address] Phone: [phone]  Fax: [phone]    Return to Endocrinology clinic as below: Future Appointments  Date Time Provider [place]  10/24/2019  8:20 AM Sueanne Margarita, MD [facility]  11/17/2019  7:30 AM Toshika Parrow, Melanie Crazier, MD [facility] None

## 2019-08-23 DIAGNOSIS — M25512 Pain in left shoulder: Secondary | ICD-10-CM | POA: Diagnosis not present

## 2019-08-24 ENCOUNTER — Telehealth: Payer: Self-pay | Admitting: Internal Medicine

## 2019-08-24 NOTE — Telephone Encounter (Signed)
Patient called re: RX that was sent in for One Touch Ultra Test Strips does not work for the new [employer] technologist that patient was given by Dr. Kelton Pillar. Patient requests a new RX for the following:  MEDICATION: One Touch Verio Test Strips  PHARMACY:   [facility] [place] Phone:  [phone]  Fax:  [phone]      IS THIS A 90 DAY SUPPLY : Yes  IS PATIENT OUT OF MEDICATION: Yes  IF NOT; HOW MUCH IS LEFT: 0  LAST APPOINTMENT DATE: @3 /08/2019  NEXT APPOINTMENT DATE:@6 /09/2019  DO WE HAVE YOUR PERMISSION TO LEAVE A DETAILED MESSAGE: Yes  OTHER COMMENTS:    **Let patient know to contact pharmacy at the end of the day to make sure medication is ready. **  ** Please notify patient to allow 48-72 hours to process**  **Encourage patient to contact the pharmacy for refills or they can request refills through [facility]**

## 2019-08-25 ENCOUNTER — Other Ambulatory Visit: Payer: Self-pay

## 2019-08-25 DIAGNOSIS — M25512 Pain in left shoulder: Secondary | ICD-10-CM | POA: Diagnosis not present

## 2019-08-25 MED ORDER — ONETOUCH VERIO VI STRP: ORAL_STRIP | 12 refills | Status: AC

## 2019-08-25 NOTE — Telephone Encounter (Signed)
Rx updated in pt chart and re-sent to pharmacy.

## 2019-08-28 NOTE — Telephone Encounter (Signed)
Reached out to pt and explained that onetouch is not covered by her insurance and asked her to find out which meter is and that I will have that meter and supplies sent to her pharmacy.

## 2019-08-30 DIAGNOSIS — M25512 Pain in left shoulder: Secondary | ICD-10-CM | POA: Diagnosis not present

## 2019-09-08 DIAGNOSIS — M25512 Pain in left shoulder: Secondary | ICD-10-CM | POA: Diagnosis not present

## 2019-09-12 DIAGNOSIS — M25512 Pain in left shoulder: Secondary | ICD-10-CM | POA: Diagnosis not present

## 2019-09-21 DIAGNOSIS — M25512 Pain in left shoulder: Secondary | ICD-10-CM | POA: Diagnosis not present

## 2019-09-25 ENCOUNTER — Ambulatory Visit: Payer: Managed Care, Other (non HMO) | Admitting: Dietician

## 2019-09-27 DIAGNOSIS — M25512 Pain in left shoulder: Secondary | ICD-10-CM | POA: Diagnosis not present

## 2019-10-04 DIAGNOSIS — M25512 Pain in left shoulder: Secondary | ICD-10-CM | POA: Diagnosis not present

## 2019-10-23 NOTE — Progress Notes (Signed)
Cardiology Office Note:    Date:  10/24/2019   ID:  [name], DOB 1966-03-14, MRN [id number]  PCP:  Maurice Small, MD  Cardiologist:  Fransico Him, MD    Referring MD: Maurice Small, MD   Chief Complaint  Patient presents with  . Sleep Apnea  . Hypertension    History of Present Illness:    [name] is a 54 y.o. female with a hx of  HTN, T2DM, prior tobacco history, family h/o CAD and personal history ofobesity as well as a h/o OSA and asthma. In Sep. 2017, she was admitted for CP w/u. She ruled out for MI and had a negative NST and normal 2D echo. EF was 55-60%.  She also has OSA and is using CPAP.    She is here today for followup and is doing well.  She denies any chest pain or pressure, SOB, DOE, PND, orthopnea, LE edema, dizziness, palpitations or syncope. She is compliant with her meds and is tolerating meds with no SE.  She is doing well with her CPAP device and thinks that she has gotten used to it.  She tolerates the mask and feels the pressure is adequate.  Since going on CPAP she feels rested in the am and has no significant daytime sleepiness.  She denies any significant mouth or nasal dryness or nasal congestion.  Se does not think that he snores.     Past Medical History:  Diagnosis Date  . Asthma   . Diabetes mellitus without complication ([place])   . Dilated aortic root (HCC)    48mm by CTA 07/2018  . Fatty liver   . Hypertension   . Morbid obesity with BMI of 40.0-44.9, adult ([place])   . OSA (obstructive sleep apnea)    mild with AHI 11/hr now on CPAP at 20cm H2O    Past Surgical History:  Procedure Laterality Date  . ABDOMINAL HYSTERECTOMY    . BACK SURGERY     l4-5  . CARPAL TUNNEL RELEASE     right  . SHOULDER SURGERY    . SPINAL CORD STIMULATOR IMPLANT      Current Medications: Current Meds  Medication Sig  . amLODipine (NORVASC) 10 MG tablet Take 10 mg by mouth every morning.   Marland Kitchen aspirin EC 81 MG tablet Take 1 tablet  (81 mg total) by mouth daily.  . Calcium Carbonate-Vitamin D (CALCIUM 600 + D PO) Take 2 tablets by mouth every morning.   . carboxymethylcellulose (REFRESH PLUS) 0.5 % SOLN Place 1 drop into both eyes 3 (three) times daily as needed (dry eyes).  . Dulaglutide (TRULICITY) A999333 0000000 SOPN Inject 0.75 mg into the skin once a week.  Marland Kitchen glimepiride (AMARYL) 4 MG tablet Take 4 mg by mouth daily.  Marland Kitchen glucose blood (ONETOUCH VERIO) test strip Use as instructed to test blood sugar 2 times daily E11.65  . meloxicam (MOBIC) 15 MG tablet Take 15 mg by mouth as needed.  . metFORMIN (GLUCOPHAGE-XR) 500 MG 24 hr tablet Take 2,000 mg by mouth daily with supper.  . metoprolol (TOPROL-XL) 200 MG 24 hr tablet Take 200 mg by mouth every evening.   Marland Kitchen oxyCODONE-acetaminophen (PERCOCET/ROXICET) 5-325 MG tablet Take 2 tablets by mouth every 4 (four) hours as needed for severe pain.  Marland Kitchen triamcinolone cream (KENALOG) 0.1 % APPLY A SMALL AMOUNT TO THE AFFECTED AREA THREE TIMES DAILY     Allergies:   Patient has no known allergies.   Social  History   Socioeconomic History  . Marital status: Married    Spouse name: Not on file  . Number of children: Not on file  . Years of education: Not on file  . Highest education level: Not on file  Occupational History  . Not on file  Tobacco Use  . Smoking status: Former Research scientist (life sciences)  . Smokeless tobacco: Never Used  Substance and Sexual Activity  . Alcohol use: Yes    Comment: socially   . Drug use: No  . Sexual activity: Not on file  Other Topics Concern  . Not on file  Social History Narrative  . Not on file   Social Determinants of Health   Financial Resource Strain:   . Difficulty of Paying Living Expenses:   Food Insecurity:   . Worried About Charity fundraiser in the Last Year:   . Arboriculturist in the Last Year:   Transportation Needs:   . Film/video editor (Medical):   Marland Kitchen Lack of Transportation (Non-Medical):   Physical Activity:   . Days of  Exercise per Week:   . Minutes of Exercise per Session:   Stress:   . Feeling of Stress :   Social Connections:   . Frequency of Communication with Friends and Family:   . Frequency of Social Gatherings with Friends and Family:   . Attends Religious Services:   . Active Member of Clubs or Organizations:   . Attends Archivist Meetings:   Marland Kitchen Marital Status:      Family History: The patient's family history includes Breast cancer in her maternal grandmother and mother; Diabetes in her father and mother; Hypertension in her father and mother; Lymphoma in her mother.  ROS:   Please see the history of present illness.    ROS  All other systems reviewed and negative.   EKGs/Labs/Other Studies Reviewed:    The following studies were reviewed today: EKG  EKG:  EKG is not ordered today.    Recent Labs: No results found for requested labs within last 8760 hours.   Recent Lipid Panel No results found for: CHOL, TRIG, HDL, CHOLHDL, VLDL, LDLCALC, LDLDIRECT  Physical Exam:    VS:  BP 128/84   Pulse 71   Ht 5\' 6"  (1.676 m)   Wt 242 lb 1.9 oz (109.8 kg)   SpO2 95%   BMI 39.08 kg/m     Wt Readings from Last 3 Encounters:  10/24/19 242 lb 1.9 oz (109.8 kg)  08/16/19 251 lb 12.8 oz (114.2 kg)  07/27/19 248 lb (112.5 kg)     GEN:  Well nourished, well developed in no acute distress HEENT: Normal NECK: No JVD; No carotid bruits LYMPHATICS: No lymphadenopathy CARDIAC: RRR, no murmurs, rubs, gallops RESPIRATORY:  Clear to auscultation without rales, wheezing or rhonchi  ABDOMEN: Soft, non-tender, non-distended MUSCULOSKELETAL:  No edema; No deformity  SKIN: Warm and dry NEUROLOGIC:  Alert and oriented x 3 PSYCHIATRIC:  Normal affect   ASSESSMENT:    1. OSA (obstructive sleep apnea)   2. Essential hypertension   3. Morbid obesity with BMI of 40.0-44.9, adult ([place])    PLAN:    In order of problems listed above:  1.  OSA - The patient is tolerating PAP therapy  well without any problems.The patient has been using and benefiting from PAP use and will continue to benefit from therapy.  -she will bring her PAP chip in later today for a download -since her device is >  43 years old and her water chamber is very worn, I will order her a new CPAP.  I will wait to see her PAP compliance report later today before ordering the pressure setting.  -she will see me back 8-10 weeks after getting new device.   2.  HTN -BP controlled on exam -continue Toprol XL 200mg  daily and amlodipine 10mg  daily  3.  Obesity -she has lost 15lbs with diet and exercise -she has started walking a lot  Medication Adjustments/Labs and Tests Ordered: Current medicines are reviewed at length with the patient today.  Concerns regarding medicines are outlined above.  No orders of the defined types were placed in this encounter.  No orders of the defined types were placed in this encounter.   Signed, Fransico Him, MD  10/24/2019 8:28 AM    [place]

## 2019-10-24 ENCOUNTER — Ambulatory Visit (INDEPENDENT_AMBULATORY_CARE_PROVIDER_SITE_OTHER): Payer: Managed Care, Other (non HMO) | Admitting: Cardiology

## 2019-10-24 ENCOUNTER — Encounter: Payer: Self-pay | Admitting: Cardiology

## 2019-10-24 ENCOUNTER — Other Ambulatory Visit: Payer: Self-pay

## 2019-10-24 VITALS — BP 128/84 | HR 71 | Ht 66.0 in | Wt 242.1 lb

## 2019-10-24 DIAGNOSIS — G4733 Obstructive sleep apnea (adult) (pediatric): Secondary | ICD-10-CM | POA: Diagnosis not present

## 2019-10-24 DIAGNOSIS — I10 Essential (primary) hypertension: Secondary | ICD-10-CM

## 2019-10-24 DIAGNOSIS — Z6841 Body Mass Index (BMI) 40.0 and over, adult: Secondary | ICD-10-CM

## 2019-10-24 NOTE — Patient Instructions (Signed)
Medication Instructions:  Your physician recommends that you continue on your current medications as directed. Please refer to the Current Medication list given to you today.  *If you need a refill on your cardiac medications before your next appointment, please call your pharmacy*  Follow-Up: At [facility], you and your health needs are our priority.  As part of our continuing mission to provide you with exceptional heart care, we have created designated Provider Care Teams.  These Care Teams include your primary Cardiologist (physician) and Advanced Practice Providers (APPs -  Physician Assistants and Nurse Practitioners) who all work together to provide you with the care you need, when you need it.  Your next appointment:   8 weeks after new device   The format for your next appointment:   Virtual   Provider:   Fransico Him, MD   Other Instructions Once we get your download from your CPAP we will place orders.

## 2019-10-31 ENCOUNTER — Other Ambulatory Visit: Payer: Self-pay | Admitting: Internal Medicine

## 2019-11-01 ENCOUNTER — Telehealth: Payer: Self-pay | Admitting: *Deleted

## 2019-11-01 DIAGNOSIS — G4733 Obstructive sleep apnea (adult) (pediatric): Secondary | ICD-10-CM

## 2019-11-01 NOTE — Telephone Encounter (Signed)
-----   Message from Sueanne Margarita, MD sent at 10/31/2019 11:04 AM EDT ----- Regarding: RE: D/L Needs to improve compliance.  Please order new Resmed CPAP at 18cm H2o with heated humidity and mask of choice.  Will need followup 8 weeks after getting her new PCP  Traci ----- Message ----- From: Freada Bergeron, CMA Sent: 10/31/2019  10:25 AM EDT To: Sueanne Margarita, MD Subject: D/L                                            D/L in epic 10/25/19 I could not copy and paste it for you. Thanks ----- Message ----- From: Sueanne Margarita, MD Sent: 10/25/2019   7:37 AM EDT To: Freada Bergeron, CMA  Please get a download on this patient so I can order her a new device  Traci

## 2019-11-01 NOTE — Telephone Encounter (Signed)
Informed patient of COMPLIANCE results and verbalized understanding was indicated. Patient is aware and agreeable to AHI being within range at 1.1. Patient is aware and agreeable to improving in compliance with machine usage. Pt is aware and agreeable to her results.

## 2019-11-01 NOTE — Telephone Encounter (Signed)
Order placed to [employer] [facility] via community message. 

## 2019-11-02 DIAGNOSIS — M25512 Pain in left shoulder: Secondary | ICD-10-CM | POA: Diagnosis not present

## 2019-11-15 ENCOUNTER — Other Ambulatory Visit: Payer: Self-pay

## 2019-11-17 ENCOUNTER — Ambulatory Visit: Payer: Managed Care, Other (non HMO) | Admitting: Internal Medicine

## 2019-11-17 DIAGNOSIS — Z0289 Encounter for other administrative examinations: Secondary | ICD-10-CM

## 2019-11-17 NOTE — Progress Notes (Deleted)
Name: [name]  Age/ Sex: 54 y.o., female   MRN/ DOB: [id number], 1966-06-08     PCP: Maurice Small, MD   Reason for Endocrinology Evaluation: Type 2 Diabetes Mellitus  Initial Endocrine Consultative Visit: 08/17/2019    PATIENT IDENTIFIER: [name] is a 54 y.o. female with a past medical history of T2DM, OSA, Asthma and cardiomegaly. The patient has followed with Endocrinology clinic since 08/17/2019 for consultative assistance with management of her diabetes.  DIABETIC HISTORY:  [name] was diagnosed with gestational diabetes ~ in 2006 requiring insulin therapy, but by 2011 she was started on Metformin for a diagnosis of T2DM, by 2014 SU was added.  Her hemoglobin A1c has ranged from 8.4% in 04/2019, peaking at 9.6% in 09/2018.   On her initial visit to our clinic she had an A1c of 10.7% , she was on Glimepiride and metformin, trulicity was added    SUBJECTIVE:   During the last visit (08/17/2019): A1c 10.7%. Trulicity was started on continued on metformin and Glimepiride  Today (11/17/2019): [name] is here for a follow up on diabetes.  She checks her blood sugars *** times daily, preprandial to breakfast and ***. The patient has *** had hypoglycemic episodes since the last clinic visit, which typically occur *** x / - most often occuring ***. The patient is *** symptomatic with these episodes, with symptoms of {symptoms; hypoglycemia:[id number]}.      HOME DIABETES REGIMEN:  Trulicity 8.78 mg weekly  Glimepiride 4 mg daily  Metformin 500 mg ,4 tablet daily       Statin: No ACE-I/ARB: Intolerant to Lisinopril- cough     METER DOWNLOAD SUMMARY: Date range evaluated: *** Fingerstick Blood Glucose Tests = *** Average Number Tests/Day = *** Overall Mean FS Glucose = *** Standard Deviation = ***  BG Ranges: Low = *** High = ***   Hypoglycemic Events/30 Days: BG < 50 = *** Episodes of symptomatic severe hypoglycemia  = ***   DIABETIC COMPLICATIONS: Microvascular complications:   Neuropathy  Denies: CKD, retinopathy  Last eye exam: Completed 2019  Macrovascular complications:    Denies: CAD, PVD, CVA    HISTORY:  Past Medical History:  Past Medical History:  Diagnosis Date  . Asthma   . Diabetes mellitus without complication ([place])   . Dilated aortic root (HCC)    36mm by CTA 07/2018  . Fatty liver   . Hypertension   . Morbid obesity with BMI of 40.0-44.9, adult ([place])   . OSA (obstructive sleep apnea)    mild with AHI 11/hr now on CPAP at 20cm H2O    Past Surgical History:  Past Surgical History:  Procedure Laterality Date  . ABDOMINAL HYSTERECTOMY    . BACK SURGERY     l4-5  . CARPAL TUNNEL RELEASE     right  . SHOULDER SURGERY    . SPINAL CORD STIMULATOR IMPLANT       Social History:  reports that she has quit smoking. She has never used smokeless tobacco. She reports current alcohol use. She reports that she does not use drugs. Family History:  Family History  Problem Relation Age of Onset  . Hypertension Mother   . Diabetes Mother   . Lymphoma Mother   . Breast cancer Mother   . Hypertension Father   . Diabetes Father   . Breast cancer Maternal Grandmother       HOME MEDICATIONS: Allergies as of 11/17/2019   No  Known Allergies     Medication List       Accurate as of November 17, 2019  7:12 AM. If you have any questions, ask your nurse or doctor.        amLODipine 10 MG tablet Commonly known as: NORVASC Take 10 mg by mouth every morning.   aspirin EC 81 MG tablet Take 1 tablet (81 mg total) by mouth daily.   CALCIUM 600 + D PO Take 2 tablets by mouth every morning.   carboxymethylcellulose 0.5 % Soln Commonly known as: REFRESH PLUS Place 1 drop into both eyes 3 (three) times daily as needed (dry eyes).   glimepiride 4 MG tablet Commonly known as: AMARYL Take 4 mg by mouth daily.   meloxicam 15 MG tablet Commonly known as: MOBIC Take 15 mg  by mouth as needed.   metFORMIN 500 MG 24 hr tablet Commonly known as: GLUCOPHAGE-XR Take 2,000 mg by mouth daily with supper.   metoprolol 200 MG 24 hr tablet Commonly known as: TOPROL-XL Take 200 mg by mouth every evening.   OneTouch Verio test strip Generic drug: glucose blood Use as instructed to test blood sugar 2 times daily E11.65   oxyCODONE-acetaminophen 5-325 MG tablet Commonly known as: PERCOCET/ROXICET Take 2 tablets by mouth every 4 (four) hours as needed for severe pain.   triamcinolone cream 0.1 % Commonly known as: KENALOG APPLY A SMALL AMOUNT TO THE AFFECTED AREA THREE TIMES DAILY   Trulicity 7.82 UM/3.5TI Sopn Generic drug: Dulaglutide ADMINISTER 0.75 MG UNDER THE SKIN 1 TIME A WEEK        OBJECTIVE:   Vital Signs: There were no vitals taken for this visit.  Wt Readings from Last 3 Encounters:  10/24/19 242 lb 1.9 oz (109.8 kg)  08/16/19 251 lb 12.8 oz (114.2 kg)  07/27/19 248 lb (112.5 kg)     Exam: General: Pt appears well and is in NAD  Hydration: Well-hydrated with moist mucous membranes and good skin turgor  HEENT: Head: Unremarkable with good dentition. Oropharynx clear without exudate.  Eyes: External eye exam normal without stare, lid lag or exophthalmos.  EOM intact.  PERRL.  Neck: General: Supple without adenopathy. Thyroid: Thyroid size normal.  No goiter or nodules appreciated. No thyroid bruit.  Lungs: Clear with good BS bilat with no rales, rhonchi, or wheezes  Heart: RRR with normal S1 and S2 and no gallops; no murmurs; no rub  Abdomen: Normoactive bowel sounds, soft, nontender, without masses or organomegaly palpable  Extremities: No pretibial edema. No tremor. Normal strength and motion throughout. See detailed diabetic foot exam below.  Skin: Normal texture and temperature to palpation. No rash noted. No Acanthosis nigricans/skin tags. No lipohypertrophy.  Neuro: MS is good with appropriate affect, pt is alert and Ox3    DM  foot exam: Please see diabetic assessment flow-sheet detailed below:           DATA REVIEWED:  No results found for: HGBA1C Lab Results  Component Value Date   CREATININE 0.66 06/29/2018   No results found for: MICRALBCREAT   No results found for: CHOL, HDL, LDLCALC, LDLDIRECT, TRIG, CHOLHDL       ASSESSMENT / PLAN / RECOMMENDATIONS:   1) Type 2 Diabetes Mellitus, ***controlled, With Neuropathic complications - Most recent A1c of *** %. Goal A1c < 7.0 %.    Plan: MEDICATIONS:  ***  EDUCATION / INSTRUCTIONS:  BG monitoring instructions: Patient is instructed to check her blood sugars *** times a day, ***.  Call [facility] if: BG persistently < 70 or > 300. . I reviewed the Rule of 15 for the treatment of hypoglycemia in detail with the patient. Literature supplied.    2) Diabetic complications:   Eye: Does not have known diabetic retinopathy.  Patient urged to have an eye exam   n euro/ Feet: Does have known diabetic peripheral neuropathy.  Renal: Patient does not have known baseline CKD. She is allergic to ACEI- cough . She is not on an ARB  at present.  3) Lipids: Patient is *** on a statin.     F/U in ***    Signed electronically by: Mack Guise, MD  [facility]  [facility] [address] [place], [place] [address] Phone: [phone] FAX: [phone]   CC: Maurice Small, MD [address] [address] Phone: [phone]  Fax: [phone]  Return to Endocrinology clinic as below: Future Appointments  Date Time Provider [place]  11/17/2019  7:30 AM Christelle Igoe, Melanie Crazier, MD [facility] None

## 2019-12-11 DIAGNOSIS — E119 Type 2 diabetes mellitus without complications: Secondary | ICD-10-CM | POA: Diagnosis not present

## 2019-12-11 DIAGNOSIS — M25512 Pain in left shoulder: Secondary | ICD-10-CM | POA: Diagnosis not present

## 2019-12-22 NOTE — Telephone Encounter (Signed)
Patient has a 10 week follow up appointment scheduled for 01-24-20. Patient understands she needs to keep this appointment for insurance compliance. Patient was grateful for the call and thanked me.

## 2019-12-22 NOTE — Telephone Encounter (Signed)
Upon patient request DME selection is [facility] Patient understands she will be contacted by [facility] to set up her cpap. Patient understands to call if [facility] does not contact her with new setup in a timely manner. Patient understands they will be called once confirmation has been received from [facility] that they have received their new machine to schedule 10 week follow up appointment.  [facility] notified of new cpap order  Please add to airview Patient was grateful for the call and thanked me.

## 2020-01-23 NOTE — Progress Notes (Signed)
This encounter was created in error - please disregard.

## 2020-01-24 ENCOUNTER — Encounter: Payer: Medicare Other | Admitting: Cardiology

## 2020-01-24 ENCOUNTER — Other Ambulatory Visit: Payer: Self-pay

## 2020-01-24 DIAGNOSIS — I10 Essential (primary) hypertension: Secondary | ICD-10-CM

## 2020-01-24 DIAGNOSIS — G4733 Obstructive sleep apnea (adult) (pediatric): Secondary | ICD-10-CM

## 2020-01-29 DIAGNOSIS — G8929 Other chronic pain: Secondary | ICD-10-CM | POA: Diagnosis not present

## 2020-01-29 DIAGNOSIS — M25512 Pain in left shoulder: Secondary | ICD-10-CM | POA: Diagnosis not present

## 2020-01-30 ENCOUNTER — Ambulatory Visit (HOSPITAL_COMMUNITY)
Admission: EM | Admit: 2020-01-30 | Discharge: 2020-01-30 | Disposition: A | Discharge status: Home / Self Care | Payer: Medicare Other

## 2020-01-30 ENCOUNTER — Encounter (HOSPITAL_COMMUNITY): Payer: Self-pay

## 2020-01-30 DIAGNOSIS — H60311 Diffuse otitis externa, right ear: Secondary | ICD-10-CM | POA: Diagnosis not present

## 2020-01-30 MED ORDER — TOBRAMYCIN 0.3 % OP SOLN
OPHTHALMIC | Status: AC
  Filled 2020-01-30: qty 5

## 2020-01-30 MED ORDER — TOBRAMYCIN 0.3 % OP OINT
TOPICAL_OINTMENT | OPHTHALMIC | Status: AC
  Filled 2020-01-30: qty 3.5

## 2020-01-30 MED ORDER — CIPROFLOXACIN HCL 500 MG PO TABS: 500.0000 mg | ORAL_TABLET | Freq: Two times a day (BID) | ORAL | 0 refills | Status: DC

## 2020-01-30 NOTE — ED Triage Notes (Signed)
Pt states she went to [facility] walk in clinic yesterday and was told she has a ear infection in right ear and was given atx ear drops. Pt states her ear started to close up this morning.

## 2020-01-30 NOTE — ED Provider Notes (Signed)
[facility]    CSN: [id number] Arrival date & time: 01/30/20  1914      History   Chief Complaint Chief Complaint  Patient presents with  . Otalgia    HPI [name] is a 54 y.o. female.   Patient reports for right ear pain and swelling.  She reports she was seen yesterday for right ear pain and prescribed eardrops from her primary care walk-in clinic.  She reports since then the ear swelled up and she can get the drops in.  She reports swelling around the ear and pain in front of the ear and below the ear.  She reports feeling some chills last night but not necessarily fever.  Denies pain behind the ear.     Past Medical History:  Diagnosis Date  . Asthma   . Diabetes mellitus without complication ([place])   . Dilated aortic root (HCC)    59mm by CTA 07/2018  . Fatty liver   . Hypertension   . Morbid obesity with BMI of 40.0-44.9, adult ([place])   . OSA (obstructive sleep apnea)    mild with AHI 11/hr now on CPAP at 20cm H2O    Patient Active Problem List   Diagnosis Date Noted  . Type 2 diabetes mellitus with hyperglycemia, without long-term current use of insulin ([place]) 08/16/2019  . Type 2 diabetes mellitus with diabetic polyneuropathy, without long-term current use of insulin ([place]) 08/16/2019  . Dyslipidemia 08/16/2019  . Dilated aortic root ([place])   . Plantar fasciitis 06/01/2018  . Hypokalemia 02/17/2016  . Chest pain 02/16/2016  . Morbid obesity with BMI of 40.0-44.9, adult ([place]) 05/15/2013  . Fatty liver   . OSA (obstructive sleep apnea)   . Hypertension   . Diabetes mellitus without complication ([place])   . Asthma     Past Surgical History:  Procedure Laterality Date  . ABDOMINAL HYSTERECTOMY    . BACK SURGERY     l4-5  . CARPAL TUNNEL RELEASE     right  . SHOULDER SURGERY    . SPINAL CORD STIMULATOR IMPLANT      OB History   No obstetric history on file.      Home Medications    Prior to Admission medications     Medication Sig Start Date End Date Taking? Authorizing Provider  naproxen (NAPROSYN) 500 MG tablet Take 500 mg by mouth 2 (two) times daily with a meal.   Yes [provider]  ofloxacin (FLOXIN) 0.3 % OTIC solution Place 5 drops into the right ear 2 (two) times daily.   Yes [provider]  amLODipine (NORVASC) 10 MG tablet Take 10 mg by mouth every morning.  04/11/14   [provider]  aspirin EC 81 MG tablet Take 1 tablet (81 mg total) by mouth daily. 01/08/17   Lyda Jester M, PA-C  Calcium Carbonate-Vitamin D (CALCIUM 600 + D PO) Take 2 tablets by mouth every morning.     [provider]  carboxymethylcellulose (REFRESH PLUS) 0.5 % SOLN Place 1 drop into both eyes 3 (three) times daily as needed (dry eyes).    [provider]  ciprofloxacin (CIPRO) 500 MG tablet Take 1 tablet (500 mg total) by mouth every 12 (twelve) hours. 01/30/20   Jatavion Peaster, Marguerita Beards, PA-C  glimepiride (AMARYL) 4 MG tablet Take 4 mg by mouth daily. 11/24/16   [provider]  glucose blood (ONETOUCH VERIO) test strip Use as instructed to test blood sugar 2 times daily  E11.65 08/25/19   Shamleffer, Melanie Crazier, MD  meloxicam (MOBIC) 15 MG tablet Take 15 mg by mouth as needed. 05/09/19   [provider]  metFORMIN (GLUCOPHAGE-XR) 500 MG 24 hr tablet Take 2,000 mg by mouth daily with supper.    [provider]  metoprolol (TOPROL-XL) 200 MG 24 hr tablet Take 200 mg by mouth every evening.     [provider]  oxyCODONE-acetaminophen (PERCOCET/ROXICET) 5-325 MG tablet Take 2 tablets by mouth every 4 (four) hours as needed for severe pain. 03/03/19   Raylene Everts, MD  triamcinolone cream (KENALOG) 0.1 % APPLY A SMALL AMOUNT TO THE AFFECTED AREA THREE TIMES DAILY 10/19/19   [provider]  TRULICITY 1.61 WR/6.0AV SOPN ADMINISTER 0.75 MG UNDER THE SKIN 1 TIME A WEEK 10/31/19   Shamleffer, Melanie Crazier, MD    Family History Family  History  Problem Relation Age of Onset  . Hypertension Mother   . Diabetes Mother   . Lymphoma Mother   . Breast cancer Mother   . Hypertension Father   . Diabetes Father   . Breast cancer Maternal Grandmother     Social History Social History   Tobacco Use  . Smoking status: Former Research scientist (life sciences)  . Smokeless tobacco: Never Used  Vaping Use  . Vaping Use: Never used  Substance Use Topics  . Alcohol use: Yes    Comment: socially   . Drug use: No     Allergies   Patient has no known allergies.   Review of Systems Review of Systems   Physical Exam Triage Vital Signs ED Triage Vitals  Enc Vitals Group     BP 01/30/20 1935 (!) 163/93     Pulse Rate 01/30/20 1935 96     Resp 01/30/20 1935 18     Temp 01/30/20 1935 99.7 F (37.6 C)     Temp Source 01/30/20 1935 Oral     SpO2 01/30/20 1935 95 %     Weight 01/30/20 1938 250 lb (113.4 kg)     Height 01/30/20 1938 5\' 6"  (1.676 m)     Head Circumference --      Peak Flow --      Pain Score 01/30/20 1938 10     Pain Loc --      Pain Edu? --      Excl. in [place]? --    No data found.  Updated Vital Signs BP (!) 163/93   Pulse 96   Temp 99.7 F (37.6 C) (Oral)   Resp 18   Ht 5\' 6"  (1.676 m)   Wt 250 lb (113.4 kg)   SpO2 95%   BMI 40.35 kg/m   Visual Acuity Right Eye Distance:   Left Eye Distance:   Bilateral Distance:    Right Eye Near:   Left Eye Near:    Bilateral Near:     Physical Exam Vitals and nursing note reviewed.  Constitutional:      General: She is not in acute distress.    Appearance: She is well-developed.  HENT:     Head: Normocephalic and atraumatic.     Left Ear: Tympanic membrane, ear canal and external ear normal.     Ears:     Comments: Right ear with swelling around the tragus with canal closed due to swelling.  Significant pain with manipulation of the pinna and tragus.  There is some preauricular lymphadenopathy and tenderness.  No mastoid swelling or tenderness. Cardiovascular:  Rate and Rhythm: Normal rate.  Pulmonary:     Effort: Pulmonary effort is normal. No respiratory distress.  Musculoskeletal:     Cervical back: Neck supple.  Skin:    General: Skin is warm and dry.  Neurological:     Mental Status: She is alert.      UC Treatments / Results  Labs (all labs ordered are listed, but only abnormal results are displayed) Labs Reviewed - No data to display  EKG   Radiology No results found.  Procedures Procedures (including critical care time) Wick placed in right ear, wetted with ofloxacin drops. Medications Ordered in UC Medications - No data to display  Initial Impression / Assessment and Plan / UC Course  I have reviewed the triage vital signs and the nursing notes.  Pertinent labs & imaging results that were available during my care of the patient were reviewed by me and considered in my medical decision making (see chart for details).     #Diffuse otitis externa right ear Patient is a 54 year old presenting with diffuse otitis externa.  No mastoid swelling or pain. Wick placed.  Will start on oral ciprofloxacin.  Patient currently on ofloxacin drops.  Instructed to follow-up with primary care tomorrow so long as this is improving.  If severely worsening she is to call the ear nose and throat.  Discussed signs of mastoiditis patient verbalized understanding. Final Clinical Impressions(s) / UC Diagnoses   Final diagnoses:  Acute diffuse otitis externa of right ear     Discharge Instructions     Keep the wick in and continue drops  Take the ciprofloxacin 2 times a day for 5 days  Follow up with your primary care  If worsening, call the ENT office     ED Prescriptions    Medication Sig Dispense Auth. Provider   ciprofloxacin (CIPRO) 500 MG tablet Take 1 tablet (500 mg total) by mouth every 12 (twelve) hours. 10 tablet Aivan Fillingim, Marguerita Beards, PA-C     PDMP not reviewed this encounter.   Purnell Shoemaker, PA-C 01/30/20 2045

## 2020-01-30 NOTE — Discharge Instructions (Signed)
Keep the wick in and continue drops  Take the ciprofloxacin 2 times a day for 5 days  Follow up with your primary care  If worsening, call the ENT office

## 2020-02-13 DIAGNOSIS — H60311 Diffuse otitis externa, right ear: Secondary | ICD-10-CM | POA: Diagnosis not present

## 2020-02-14 DIAGNOSIS — U071 COVID-19: Secondary | ICD-10-CM

## 2020-02-14 HISTORY — DX: COVID-19: U07.1

## 2020-02-28 ENCOUNTER — Other Ambulatory Visit: Payer: Self-pay | Admitting: Internal Medicine

## 2020-02-29 ENCOUNTER — Other Ambulatory Visit: Payer: Self-pay | Admitting: Family Medicine

## 2020-02-29 DIAGNOSIS — Z1231 Encounter for screening mammogram for malignant neoplasm of breast: Secondary | ICD-10-CM

## 2020-03-04 ENCOUNTER — Other Ambulatory Visit: Payer: Self-pay

## 2020-03-04 ENCOUNTER — Encounter: Payer: Self-pay | Admitting: Cardiology

## 2020-03-04 ENCOUNTER — Ambulatory Visit: Payer: Managed Care, Other (non HMO)

## 2020-03-04 ENCOUNTER — Telehealth (INDEPENDENT_AMBULATORY_CARE_PROVIDER_SITE_OTHER): Payer: Managed Care, Other (non HMO) | Admitting: Cardiology

## 2020-03-04 VITALS — BP 143/86 | Ht 66.0 in | Wt 250.6 lb

## 2020-03-04 DIAGNOSIS — I10 Essential (primary) hypertension: Secondary | ICD-10-CM | POA: Diagnosis not present

## 2020-03-04 DIAGNOSIS — G4733 Obstructive sleep apnea (adult) (pediatric): Secondary | ICD-10-CM | POA: Diagnosis not present

## 2020-03-04 DIAGNOSIS — Z6841 Body Mass Index (BMI) 40.0 and over, adult: Secondary | ICD-10-CM

## 2020-03-04 NOTE — Progress Notes (Signed)
Virtual Visit via Telephone Note   This visit type was conducted due to national recommendations for restrictions regarding the COVID-19 Pandemic (e.g. social distancing) in an effort to limit this patient's exposure and mitigate transmission in our community.  Due to her co-morbid illnesses, this patient is at least at moderate risk for complications without adequate follow up.  This format is felt to be most appropriate for this patient at this time.  The patient did not have access to video technology/had technical difficulties with video requiring transitioning to audio format only (telephone).  All issues noted in this document were discussed and addressed.  No physical exam could be performed with this format.  Please refer to the patient's chart for her  consent to telehealth for [facility].   Evaluation Performed:  Follow-up visit  This visit type was conducted due to national recommendations for restrictions regarding the COVID-19 Pandemic (e.g. social distancing).  This format is felt to be most appropriate for this patient at this time.  All issues noted in this document were discussed and addressed.  No physical exam was performed (except for noted visual exam findings with Video Visits).  Please refer to the patient's chart (MyChart message for video visits and phone note for telephone visits) for the patient's consent to telehealth for [facility].  Date:  03/04/2020   ID:  [name], DOB 13-May-1966, MRN [id number]  Patient Location:  Home  Provider location:   [facility]  Cardiology Office Note:    Date:  03/04/2020   ID:  [name], DOB 1965/07/13, MRN [id number]  PCP:  Maurice Small, MD  Cardiologist:  Fransico Him, MD    Referring MD: Maurice Small, MD   Chief Complaint  Patient presents with   Sleep Apnea   Hypertension    History of Present Illness:    [name] is a 54 y.o. female with a hx of  HTN, T2DM,  prior tobacco history, family h/o CAD and personal history ofobesity as well as a h/o OSA and asthma. In Sep. 2017, she was admitted for CP w/u. She ruled out for MI and had a negative NST and normal 2D echo. EF was 55-60%.  She also has OSA and is using CPAP.  She had minimal coronary Ca on coronary CTA with a Ca score of <1 but no CAD.  She is here to day for followup per insurance compliance to document compliance after getting a new device. She is doing well with her CPAP device and thinks that she has gotten used to it.  She tolerates the mask and feels the pressure is adequate.  Since going on CPAP she feels rested in the am and has no significant daytime sleepiness.  She denies any significant mouth or nasal dryness or nasal congestion.  She does not think that he snores.     Past Medical History:  Diagnosis Date   Asthma    Diabetes mellitus without complication ([place])    Dilated aortic root ([place])    71mm by CTA 07/2018   Fatty liver    Hypertension    Morbid obesity with BMI of 40.0-44.9, adult (HCC)    OSA (obstructive sleep apnea)    mild with AHI 11/hr now on CPAP at 20cm H2O    Past Surgical History:  Procedure Laterality Date   ABDOMINAL HYSTERECTOMY     BACK SURGERY     l4-5   CARPAL TUNNEL RELEASE  right   SHOULDER SURGERY     SPINAL CORD STIMULATOR IMPLANT      Current Medications: Current Meds  Medication Sig   amLODipine (NORVASC) 10 MG tablet Take 10 mg by mouth every morning.    aspirin EC 81 MG tablet Take 1 tablet (81 mg total) by mouth daily.   Calcium Carbonate-Vitamin D (CALCIUM 600 + D PO) Take 2 tablets by mouth every morning.    carboxymethylcellulose (REFRESH PLUS) 0.5 % SOLN Place 1 drop into both eyes 3 (three) times daily as needed (dry eyes).   ciprofloxacin (CIPRO) 500 MG tablet Take 1 tablet (500 mg total) by mouth every 12 (twelve) hours.   glimepiride (AMARYL) 4 MG tablet Take 4 mg by mouth daily.   glucose blood  (ONETOUCH VERIO) test strip Use as instructed to test blood sugar 2 times daily E11.65   meloxicam (MOBIC) 15 MG tablet Take 15 mg by mouth as needed.   metFORMIN (GLUCOPHAGE-XR) 500 MG 24 hr tablet Take 2,000 mg by mouth daily with supper.   metoprolol (TOPROL-XL) 200 MG 24 hr tablet Take 200 mg by mouth every evening.    naproxen (NAPROSYN) 500 MG tablet Take 500 mg by mouth 2 (two) times daily with a meal.   ofloxacin (FLOXIN) 0.3 % OTIC solution Place 5 drops into the right ear 2 (two) times daily.   oxyCODONE-acetaminophen (PERCOCET/ROXICET) 5-325 MG tablet Take 2 tablets by mouth every 4 (four) hours as needed for severe pain.   triamcinolone cream (KENALOG) 0.1 % APPLY A SMALL AMOUNT TO THE AFFECTED AREA THREE TIMES DAILY   TRULICITY 7.51 WC/5.8NI SOPN ADMINISTER 0.75 MG UNDER THE SKIN 1 TIME A WEEK     Allergies:   Patient has no known allergies.   Social History   Socioeconomic History   Marital status: Married    Spouse name: Not on file   Number of children: Not on file   Years of education: Not on file   Highest education level: Not on file  Occupational History   Not on file  Tobacco Use   Smoking status: Former Smoker   Smokeless tobacco: Never Used  Scientific laboratory technician Use: Never used  Substance and Sexual Activity   Alcohol use: Yes    Comment: socially    Drug use: No   Sexual activity: Not on file  Other Topics Concern   Not on file  Social History Narrative   Not on file   Social Determinants of Health   Financial Resource Strain:    Difficulty of Paying Living Expenses: Not on file  Food Insecurity:    Worried About [place] in the Last Year: Not on file   [place] in the Last Year: Not on file  Transportation Needs:    Lack of Transportation (Medical): Not on file   Lack of Transportation (Non-Medical): Not on file  Physical Activity:    Days of Exercise per Week: Not on file   Minutes of Exercise per  Session: Not on file  Stress:    Feeling of Stress : Not on file  Social Connections:    Frequency of Communication with Friends and Family: Not on file   Frequency of Social Gatherings with Friends and Family: Not on file   Attends Religious Services: Not on file   Active Member of Clubs or Organizations: Not on file   Attends Archivist Meetings: Not on file   Marital Status: Not  on file     Family History: The patient's family history includes Breast cancer in her maternal grandmother and mother; Diabetes in her father and mother; Hypertension in her father and mother; Lymphoma in her mother.  ROS:   Please see the history of present illness.    ROS  All other systems reviewed and negative.   EKGs/Labs/Other Studies Reviewed:    The following studies were reviewed today:  PAP compliance download  EKG:  EKG is not ordered today.    Recent Labs: No results found for requested labs within last 8760 hours.   Recent Lipid Panel No results found for: CHOL, TRIG, HDL, CHOLHDL, VLDL, LDLCALC, LDLDIRECT  Physical Exam:    VS:  BP (!) 143/86    Ht 5\' 6"  (1.676 m)    Wt 250 lb 9.6 oz (113.7 kg)    BMI 40.45 kg/m     Wt Readings from Last 3 Encounters:  03/04/20 250 lb 9.6 oz (113.7 kg)  01/30/20 250 lb (113.4 kg)  10/24/19 242 lb 1.9 oz (109.8 kg)     ASSESSMENT:    1. OSA (obstructive sleep apnea)   2. Essential hypertension   3. Morbid obesity with BMI of 40.0-44.9, adult ([place])    PLAN:    In order of problems listed above:  1. OSA - The patient is tolerating PAP therapy well without any problems. The PAP download was reviewed today and showed an AHI of 1.1/hr on 18 cm H2O with 87% compliance in using more than 4 hours nightly.  The patient has been using and benefiting from PAP use and will continue to benefit from therapy.   2.  HTN -BP borderline controlled on exam -continue Toprol XL 200mg  daily and amlodipine 10mg  daily  3.  Morbid  Obesity -unfortunately she has gained some weight since I saw her last -she just joined the aquatics center and plans to swim -she has cut out sodas and is trying to work on carbs -I will refer her to the [facility] Healthy Weight and Wellness Program  4.  Dilated aortic root -noted on coronary CTA at 91mm but 2D echoes have all been normal  COVID-19 Education: The signs and symptoms of COVID-19 were discussed with the patient and how to seek care for testing (follow up with PCP or arrange E-visit).  The importance of social distancing was discussed today.  Patient Risk:   After full review of this patient's clinical status, I feel that they are at least moderate risk at this time.  Time:   Today, I have spent 20 minutes on telemedicine discussing medical problems including OSA, HTN, morbid Obesity and reviewing patient's chart including PAP compliance download, 2D echo and coronary CTA    Medication Adjustments/Labs and Tests Ordered: Current medicines are reviewed at length with the patient today.  Concerns regarding medicines are outlined above.  No orders of the defined types were placed in this encounter.  No orders of the defined types were placed in this encounter.  Followup with me in 1 year  Signed, Fransico Him, MD  03/04/2020 8:35 AM    [place]

## 2020-03-04 NOTE — Addendum Note (Signed)
Addended by: Antonieta Iba on: 03/04/2020 08:49 AM   Modules accepted: Orders

## 2020-03-04 NOTE — Patient Instructions (Signed)

## 2020-03-05 ENCOUNTER — Encounter (INDEPENDENT_AMBULATORY_CARE_PROVIDER_SITE_OTHER): Payer: Self-pay

## 2020-03-06 ENCOUNTER — Ambulatory Visit: Payer: Managed Care, Other (non HMO) | Admitting: Internal Medicine

## 2020-03-06 NOTE — Progress Notes (Deleted)
Name: [name]  Age/ Sex: 54 y.o., female   MRN/ DOB: [id number], [birth year]     PCP: Maurice Small, MD   Reason for Endocrinology Evaluation: Type 2 Diabetes Mellitus  Initial Endocrine Consultative Visit: 08/17/2019    PATIENT IDENTIFIER: [name] is a 54 y.o. female with a past medical history of T2DM, Asthma, OSA and cardiomegaly. The patient has followed with Endocrinology clinic since 08/17/2019 for consultative assistance with management of her diabetes.  DIABETIC HISTORY:  [name] was diagnosed with DM in 2007 as gestational diabetes. Was treated with insulin for a short time and 5 years later was started on metformin. SU was added later. Her hemoglobin A1c has ranged from 8.4% in 04/2019, peaking at 9.6% in 09/2018.  On her initial visit to our clinic she had an A1c of 10.7%   , she was on Metformin and Glimepiride     Sister with thyroid disease  SUBJECTIVE:   During the last visit (08/17/2019): A1c 10.7 %. We started Trulicity and continued metformin and Glimepiride  Today (03/06/2020): [name]  She checks her blood sugars *** times daily, preprandial to breakfast and ***. The patient has *** had hypoglycemic episodes since the last clinic visit, which typically occur *** x / - most often occuring ***. The patient is *** symptomatic with these episodes, with symptoms of {symptoms; hypoglycemia:[id number]}.       HOME DIABETES REGIMEN:  Trulicity 2.59 mg weekly  Glimepiride 4 mg daily  Metformin 500 mg ,4 tablet daily   Statin: No ACE-I/ARB: Allergic to lisinopril- cough     METER DOWNLOAD SUMMARY: Date range evaluated: *** Fingerstick Blood Glucose Tests = *** Average Number Tests/Day = *** Overall Mean FS Glucose = *** Standard Deviation = ***  BG Ranges: Low = *** High = ***   Hypoglycemic Events/30 Days: BG < 50 = *** Episodes of symptomatic severe hypoglycemia = ***    DIABETIC  COMPLICATIONS: Microvascular complications:   Neuropathy  Denies: CKD, retinopathy  Last Eye Exam: Completed 2019  Macrovascular complications:    Denies: CAD, CVA, PVD   HISTORY:  Past Medical History:  Past Medical History:  Diagnosis Date  . Asthma   . Diabetes mellitus without complication ([place])   . Dilated aortic root (HCC)    70mm by CTA 07/2018  . Fatty liver   . Hypertension   . Morbid obesity with BMI of 40.0-44.9, adult ([place])   . OSA (obstructive sleep apnea)    mild with AHI 11/hr now on CPAP at 20cm H2O    Past Surgical History:  Past Surgical History:  Procedure Laterality Date  . ABDOMINAL HYSTERECTOMY    . BACK SURGERY     l4-5  . CARPAL TUNNEL RELEASE     right  . SHOULDER SURGERY    . SPINAL CORD STIMULATOR IMPLANT       Social History:  reports that she has quit smoking. She has never used smokeless tobacco. She reports current alcohol use. She reports that she does not use drugs. Family History:  Family History  Problem Relation Age of Onset  . Hypertension Mother   . Diabetes Mother   . Lymphoma Mother   . Breast cancer Mother   . Hypertension Father   . Diabetes Father   . Breast cancer Maternal Grandmother       HOME MEDICATIONS: Allergies as of 03/06/2020   No Known Allergies     Medication List  Accurate as of March 06, 2020  7:20 AM. If you have any questions, ask your nurse or doctor.        amLODipine 10 MG tablet Commonly known as: NORVASC Take 10 mg by mouth every morning.   aspirin EC 81 MG tablet Take 1 tablet (81 mg total) by mouth daily.   CALCIUM 600 + D PO Take 2 tablets by mouth every morning.   carboxymethylcellulose 0.5 % Soln Commonly known as: REFRESH PLUS Place 1 drop into both eyes 3 (three) times daily as needed (dry eyes).   ciprofloxacin 500 MG tablet Commonly known as: CIPRO Take 1 tablet (500 mg total) by mouth every 12 (twelve) hours.   glimepiride 4 MG tablet Commonly known  as: AMARYL Take 4 mg by mouth daily.   meloxicam 15 MG tablet Commonly known as: MOBIC Take 15 mg by mouth as needed.   metFORMIN 500 MG 24 hr tablet Commonly known as: GLUCOPHAGE-XR Take 2,000 mg by mouth daily with supper.   metoprolol 200 MG 24 hr tablet Commonly known as: TOPROL-XL Take 200 mg by mouth every evening.   naproxen 500 MG tablet Commonly known as: NAPROSYN Take 500 mg by mouth 2 (two) times daily with a meal.   ofloxacin 0.3 % OTIC solution Commonly known as: FLOXIN Place 5 drops into the right ear 2 (two) times daily.   OneTouch Verio test strip Generic drug: glucose blood Use as instructed to test blood sugar 2 times daily E11.65   oxyCODONE-acetaminophen 5-325 MG tablet Commonly known as: PERCOCET/ROXICET Take 2 tablets by mouth every 4 (four) hours as needed for severe pain.   triamcinolone cream 0.1 % Commonly known as: KENALOG APPLY A SMALL AMOUNT TO THE AFFECTED AREA THREE TIMES DAILY   Trulicity 8.65 HQ/4.6NG Sopn Generic drug: Dulaglutide ADMINISTER 0.75 MG UNDER THE SKIN 1 TIME A WEEK        OBJECTIVE:   Vital Signs: There were no vitals taken for this visit.  Wt Readings from Last 3 Encounters:  03/04/20 250 lb 9.6 oz (113.7 kg)  01/30/20 250 lb (113.4 kg)  10/24/19 242 lb 1.9 oz (109.8 kg)     Exam: General: Pt appears well and is in NAD  Lungs: Clear with good BS bilat with no rales, rhonchi, or wheezes  Heart: RRR with normal S1 and S2 and no gallops; no murmurs; no rub  Abdomen: Normoactive bowel sounds, soft, nontender, without masses or organomegaly palpable  Extremities: No pretibial edema  Neuro: MS is good with appropriate affect, pt is alert and Ox3     DM foot exam: 08/16/2019  The skin of the feet is intact without sores or ulcerations. The pedal pulses are 2+ on right and 2+ on left. The sensation is intact to a screening 5.07, 10 gram monofilament bilaterally   DATA REVIEWED:  No results found for:  HGBA1C Lab Results  Component Value Date   CREATININE 0.66 06/29/2018   05/01/2019  BUN/Cr 13/0.79  GFR 76 TG 121 LDL 105    09/16/2018 Ma/Cr ratio 27         ASSESSMENT / PLAN / RECOMMENDATIONS:   1) Type 2 Diabetes Mellitus, ***controlled, With Neuropathic complications - Most recent A1c of *** %. Goal A1c < 7.0 %.     MEDICATIONS:  ***  EDUCATION / INSTRUCTIONS:  BG monitoring instructions: Patient is instructed to check her blood sugars *** times a day, ***.  Call [place] [facility] clinic if: BG persistently < 70 or >  300. . I reviewed the Rule of 15 for the treatment of hypoglycemia in detail with the patient. Literature supplied.   2) Diabetic complications:   Eye: Does not have known diabetic retinopathy.   Neuro/ Feet: Does  have known diabetic peripheral neuropathy .   Renal: Patient does not have known baseline CKD. She   is Allergic to Lisinopril - cough.  She is not on an ARB at present.    3) Dyslipidemia:  F/U in ***    Signed electronically by: Mack Guise, MD  [facility] Endocrinology  [address] [place], [place] [address] Phone: [phone] FAX: [phone]   CC: Maurice Small, MD [address] [address] [address] Phone: [phone]  Fax: [phone]  Return to Endocrinology clinic as below: Future Appointments  Date Time Provider [place]  03/06/2020  8:30 AM Raymont Andreoni, Melanie Crazier, MD [facility] None

## 2020-03-13 ENCOUNTER — Other Ambulatory Visit: Payer: Self-pay

## 2020-03-13 ENCOUNTER — Encounter: Payer: Managed Care, Other (non HMO) | Admitting: Internal Medicine

## 2020-03-13 NOTE — Progress Notes (Deleted)
Name: [name]  Age/ Sex: 54 y.o., female   MRN/ DOB: [id number], [birth year]     PCP: Maurice Small, MD   Reason for Endocrinology Evaluation: Type 2 Diabetes Mellitus  Initial Endocrine Consultative Visit: 08/17/2019    PATIENT IDENTIFIER: [name] is a 54 y.o. female with a past medical history of T2DM, Asthma, OSA and cardiomegaly. The patient has followed with Endocrinology clinic since 08/17/2019 for consultative assistance with management of her diabetes.  DIABETIC HISTORY:  [name] was diagnosed with DM in 2007 as gestational diabetes. Was treated with insulin for a short time and 5 years later was started on metformin. SU was added later. Her hemoglobin A1c has ranged from 8.4% in 04/2019, peaking at 9.6% in 09/2018.  On her initial visit to our clinic she had an A1c of 10.7%   , she was on Metformin and Glimepiride     Sister with thyroid disease  SUBJECTIVE:   During the last visit (08/17/2019): A1c 10.7 %. We started Trulicity and continued metformin and Glimepiride  Today (03/13/2020): [name] is here for a follow up on diabetes. She has not been seen in 6 months, she missed her follow up back in 11/2018.  She checks her blood sugars 1 times daily. The patient has had hypoglycemic episodes since the last clinic visit, which typically occur rarely       HOME DIABETES REGIMEN:  Trulicity 0.01 mg weekly  Glimepiride 4 mg daily  Metformin 500 mg ,4 tablet daily   Statin: No ACE-I/ARB: Allergic to lisinopril- cough     GLUCOSE LOG:  68-200 mg/dL    DIABETIC COMPLICATIONS: Microvascular complications:   Neuropathy  Denies: CKD, retinopathy  Last Eye Exam: Completed 2019  Macrovascular complications:    Denies: CAD, CVA, PVD   HISTORY:  Past Medical History:  Past Medical History:  Diagnosis Date  . Asthma   . Diabetes mellitus without complication ([place])   . Dilated aortic root (HCC)    79mm by CTA  07/2018  . Fatty liver   . Hypertension   . Morbid obesity with BMI of 40.0-44.9, adult ([place])   . OSA (obstructive sleep apnea)    mild with AHI 11/hr now on CPAP at 20cm H2O   Past Surgical History:  Past Surgical History:  Procedure Laterality Date  . ABDOMINAL HYSTERECTOMY    . BACK SURGERY     l4-5  . CARPAL TUNNEL RELEASE     right  . SHOULDER SURGERY    . SPINAL CORD STIMULATOR IMPLANT      Social History:  reports that she has quit smoking. She has never used smokeless tobacco. She reports current alcohol use. She reports that she does not use drugs. Family History:  Family History  Problem Relation Age of Onset  . Hypertension Mother   . Diabetes Mother   . Lymphoma Mother   . Breast cancer Mother   . Hypertension Father   . Diabetes Father   . Breast cancer Maternal Grandmother      HOME MEDICATIONS: Allergies as of 03/13/2020   No Known Allergies     Medication List       Accurate as of March 13, 2020 11:17 AM. If you have any questions, ask your nurse or doctor.        amLODipine 10 MG tablet Commonly known as: NORVASC Take 10 mg by mouth every morning.   aspirin EC 81 MG tablet Take 1  tablet (81 mg total) by mouth daily.   CALCIUM 600 + D PO Take 2 tablets by mouth every morning.   carboxymethylcellulose 0.5 % Soln Commonly known as: REFRESH PLUS Place 1 drop into both eyes 3 (three) times daily as needed (dry eyes).   ciprofloxacin 500 MG tablet Commonly known as: CIPRO Take 1 tablet (500 mg total) by mouth every 12 (twelve) hours.   glimepiride 4 MG tablet Commonly known as: AMARYL Take 4 mg by mouth daily.   meloxicam 15 MG tablet Commonly known as: MOBIC Take 15 mg by mouth as needed.   metFORMIN 500 MG 24 hr tablet Commonly known as: GLUCOPHAGE-XR Take 2,000 mg by mouth daily with supper.   metoprolol 200 MG 24 hr tablet Commonly known as: TOPROL-XL Take 200 mg by mouth every evening.   naproxen 500 MG tablet Commonly  known as: NAPROSYN Take 500 mg by mouth 2 (two) times daily with a meal.   ofloxacin 0.3 % OTIC solution Commonly known as: FLOXIN Place 5 drops into the right ear 2 (two) times daily.   OneTouch Verio test strip Generic drug: glucose blood Use as instructed to test blood sugar 2 times daily E11.65   oxyCODONE-acetaminophen 5-325 MG tablet Commonly known as: PERCOCET/ROXICET Take 2 tablets by mouth every 4 (four) hours as needed for severe pain.   triamcinolone cream 0.1 % Commonly known as: KENALOG APPLY A SMALL AMOUNT TO THE AFFECTED AREA THREE TIMES DAILY   Trulicity 3.55 DD/2.2GU Sopn Generic drug: Dulaglutide ADMINISTER 0.75 MG UNDER THE SKIN 1 TIME A WEEK        OBJECTIVE:   Vital Signs: There were no vitals taken for this visit.  Wt Readings from Last 3 Encounters:  03/04/20 250 lb 9.6 oz (113.7 kg)  01/30/20 250 lb (113.4 kg)  10/24/19 242 lb 1.9 oz (109.8 kg)     Exam: General: Pt appears well and is in NAD  Lungs: Clear with good BS bilat with no rales, rhonchi, or wheezes  Heart: RRR with normal S1 and S2 and no gallops; no murmurs; no rub  Abdomen: Normoactive bowel sounds, soft, nontender, without masses or organomegaly palpable  Extremities: No pretibial edema  Neuro: MS is good with appropriate affect, pt is alert and Ox3     DM foot exam: 08/16/2019  The skin of the feet is intact without sores or ulcerations. The pedal pulses are 2+ on right and 2+ on left. The sensation is intact to a screening 5.07, 10 gram monofilament bilaterally   DATA REVIEWED:  No results found for: HGBA1C Lab Results  Component Value Date   CREATININE 0.66 06/29/2018   05/01/2019  BUN/Cr 13/0.79  GFR 76 TG 121 LDL 105    09/16/2018 Ma/Cr ratio 27         ASSESSMENT / PLAN / RECOMMENDATIONS:   1) Type 2 Diabetes Mellitus, ***controlled, With Neuropathic complications - Most recent A1c of *** %. Goal A1c < 7.0 %.      MEDICATIONS:  ***  EDUCATION / INSTRUCTIONS:  BG monitoring instructions: Patient is instructed to check her blood sugars *** times a day, ***.  Call [place] [facility] clinic if: BG persistently < 70 or > 300. . I reviewed the Rule of 15 for the treatment of hypoglycemia in detail with the patient. Literature supplied.   2) Diabetic complications:   Eye: Does not have known diabetic retinopathy.   Neuro/ Feet: Does  have known diabetic peripheral neuropathy .   Renal:  Patient does not have known baseline CKD. She   is Allergic to Lisinopril - cough.  She is not on an ARB at present.    3) Dyslipidemia:  F/U in ***    Signed electronically by: Mack Guise, MD  [facility] Endocrinology  [facility] [address] [place], [place] [address] Phone: [phone] FAX: [phone]   CC: Maurice Small, MD [address] [place] [address] Phone: [phone]  Fax: [phone]  Return to Endocrinology clinic as below: Future Appointments  Date Time Provider [place]  03/13/2020  3:40 PM Jawuan Robb, Melanie Crazier, MD [facility] None

## 2020-03-17 ENCOUNTER — Other Ambulatory Visit: Payer: Self-pay | Admitting: Adult Health

## 2020-03-17 DIAGNOSIS — U071 COVID-19: Secondary | ICD-10-CM

## 2020-03-17 NOTE — Progress Notes (Signed)
I connected by phone with [name] on 03/17/2020 at 4:30 PM to discuss the potential use of a new treatment for mild to moderate COVID-19 viral infection in non-hospitalized patients.  This patient is a 54 y.o. female that meets the FDA criteria for Emergency Use Authorization of COVID monoclonal antibody casirivimab/imdevimab or bamlanivimab/eteseviamb.  Has a (+) direct SARS-CoV-2 viral test result  Has mild or moderate COVID-19   Is NOT hospitalized due to COVID-19  Is within 10 days of symptom onset  Has at least one of the high risk factor(s) for progression to severe COVID-19 and/or hospitalization as defined in EUA.  Specific high risk criteria : Diabetes   I have spoken and communicated the following to the patient or parent/caregiver regarding COVID monoclonal antibody treatment:  1. FDA has authorized the emergency use for the treatment of mild to moderate COVID-19 in adults and pediatric patients with positive results of direct SARS-CoV-2 viral testing who are 31 years of age and older weighing at least 40 kg, and who are at high risk for progressing to severe COVID-19 and/or hospitalization.  2. The significant known and potential risks and benefits of COVID monoclonal antibody, and the extent to which such potential risks and benefits are unknown.  3. Information on available alternative treatments and the risks and benefits of those alternatives, including clinical trials.  4. Patients treated with COVID monoclonal antibody should continue to self-isolate and use infection control measures (e.g., wear mask, isolate, social distance, avoid sharing personal items, clean and disinfect "high touch" surfaces, and frequent handwashing) according to CDC guidelines.   5. The patient or parent/caregiver has the option to accept or refuse COVID monoclonal antibody treatment.  After reviewing this information with the patient, the patient has agreed to receive one of  the available covid 19 monoclonal antibodies and will be provided an appropriate fact sheet prior to infusion.  Set up 10/4/ at [place], NP 03/17/2020 4:30 PM

## 2020-03-18 ENCOUNTER — Other Ambulatory Visit (HOSPITAL_COMMUNITY): Payer: Self-pay

## 2020-03-18 ENCOUNTER — Ambulatory Visit (HOSPITAL_COMMUNITY)
Admission: RE | Admit: 2020-03-18 | Discharge: 2020-03-18 | Disposition: A | Discharge status: Home / Self Care | Payer: Managed Care, Other (non HMO) | Source: Ambulatory Visit | Attending: Pulmonary Disease | Admitting: Pulmonary Disease

## 2020-03-18 DIAGNOSIS — U071 COVID-19: Secondary | ICD-10-CM | POA: Diagnosis not present

## 2020-03-18 DIAGNOSIS — Z23 Encounter for immunization: Secondary | ICD-10-CM | POA: Diagnosis not present

## 2020-03-18 MED ORDER — METHYLPREDNISOLONE SODIUM SUCC 125 MG IJ SOLR: 125.0000 mg | Freq: Once | INTRAMUSCULAR | Status: DC | PRN

## 2020-03-18 MED ORDER — FAMOTIDINE IN NACL 20-0.9 MG/50ML-% IV SOLN: 20.0000 mg | Freq: Once | INTRAVENOUS | Status: DC | PRN

## 2020-03-18 MED ORDER — DIPHENHYDRAMINE HCL 50 MG/ML IJ SOLN: 50.0000 mg | Freq: Once | INTRAMUSCULAR | Status: DC | PRN

## 2020-03-18 MED ORDER — SODIUM CHLORIDE 0.9 % IV SOLN: INTRAVENOUS | Status: DC | PRN

## 2020-03-18 MED ORDER — EPINEPHRINE 0.3 MG/0.3ML IJ SOAJ: 0.3000 mg | Freq: Once | INTRAMUSCULAR | Status: DC | PRN

## 2020-03-18 MED ORDER — ALBUTEROL SULFATE HFA 108 (90 BASE) MCG/ACT IN AERS: 2.0000 | INHALATION_SPRAY | Freq: Once | RESPIRATORY_TRACT | Status: DC | PRN

## 2020-03-18 MED ORDER — SODIUM CHLORIDE 0.9 % IV SOLN
1200.0000 mg | Freq: Once | INTRAVENOUS | Status: AC
  Administered 2020-03-18: 1200 mg via INTRAVENOUS

## 2020-03-18 NOTE — Progress Notes (Signed)
  Diagnosis: COVID-19  Physician:Patrick Wright, MD  Procedure: Covid Infusion Clinic Med: casirivimab\imdevimab infusion - Provided patient with casirivimab\imdevimab fact sheet for patients, parents and caregivers prior to infusion.  Complications: No immediate complications noted.  Discharge: Discharged home   Karleen Seebeck G Malcolm Quast 03/18/2020   

## 2020-03-18 NOTE — Telephone Encounter (Signed)
Patient treated today with monoclonal antibody infusion.

## 2020-03-18 NOTE — Discharge Instructions (Signed)

## 2020-03-28 ENCOUNTER — Other Ambulatory Visit: Payer: Self-pay

## 2020-03-28 ENCOUNTER — Encounter: Payer: Self-pay | Admitting: Internal Medicine

## 2020-03-28 ENCOUNTER — Ambulatory Visit (INDEPENDENT_AMBULATORY_CARE_PROVIDER_SITE_OTHER): Payer: Managed Care, Other (non HMO) | Admitting: Internal Medicine

## 2020-03-28 VITALS — BP 132/80 | HR 77 | Ht 66.0 in | Wt 250.8 lb

## 2020-03-28 DIAGNOSIS — E1165 Type 2 diabetes mellitus with hyperglycemia: Secondary | ICD-10-CM

## 2020-03-28 DIAGNOSIS — E1142 Type 2 diabetes mellitus with diabetic polyneuropathy: Secondary | ICD-10-CM | POA: Diagnosis not present

## 2020-03-28 DIAGNOSIS — E785 Hyperlipidemia, unspecified: Secondary | ICD-10-CM | POA: Diagnosis not present

## 2020-03-28 LAB — POCT GLYCOSYLATED HEMOGLOBIN (HGB A1C): Hemoglobin A1C: 8.4 % — AB (ref 4.0–5.6)

## 2020-03-28 LAB — POCT GLUCOSE (DEVICE FOR HOME USE): Glucose Fasting, POC: 322 mg/dL — AB (ref 70–99)

## 2020-03-28 MED ORDER — ATORVASTATIN CALCIUM 10 MG PO TABS: 10.0000 mg | ORAL_TABLET | Freq: Every day | ORAL | 3 refills | Status: DC

## 2020-03-28 MED ORDER — GLIMEPIRIDE 2 MG PO TABS: 2.0000 mg | ORAL_TABLET | Freq: Every day | ORAL | 3 refills | Status: AC

## 2020-03-28 MED ORDER — TRULICITY 1.5 MG/0.5ML ~~LOC~~ SOAJ: 1.5000 mg | SUBCUTANEOUS | 11 refills | Status: DC

## 2020-03-28 NOTE — Patient Instructions (Addendum)
-   Increase Trulicity to 1.5 mg weekly  - Decrease Glimepiride to 2 mg , 1 tablet daily  - Continue Metformin 1000 mg Twice daily  - Start Lipitor 10 mg daily at night ( cholesterol)       HOW TO TREAT LOW BLOOD SUGARS (Blood sugar LESS THAN 70 MG/DL)  Please follow the RULE OF 15 for the treatment of hypoglycemia treatment (when your (blood sugars are less than 70 mg/dL)    STEP 1: Take 15 grams of carbohydrates when your blood sugar is low, which includes:   3-4 GLUCOSE TABS  OR  3-4 OZ OF JUICE OR REGULAR SODA OR  ONE TUBE OF GLUCOSE GEL     STEP 2: RECHECK blood sugar in 15 MINUTES STEP 3: If your blood sugar is still low at the 15 minute recheck --> then, go back to STEP 1 and treat AGAIN with another 15 grams of carbohydrates.

## 2020-03-28 NOTE — Progress Notes (Signed)
Name: [name]  Age/ Sex: 54 y.o., female   MRN/ DOB: [id number], 1966-06-06     PCP: Maurice Small, MD   Reason for Endocrinology Evaluation: Type 2 Diabetes Mellitus  Initial Endocrine Consultative Visit: 08/17/2019    PATIENT IDENTIFIER: [name] is a 54 y.o. female with a past medical history of T2DM, Asthma, OSA and cardiomegaly. The patient has followed with Endocrinology clinic since 08/17/2019 for consultative assistance with management of her diabetes.  DIABETIC HISTORY:  [name] was diagnosed with DM in 2007 as gestational diabetes. Was treated with insulin for a short time and 5 years later was started on metformin. SU was added later. Her hemoglobin A1c has ranged from 8.4% in 04/2019, peaking at 9.6% in 09/2018.  On her initial visit to our clinic she had an A1c of 10.7%   , she was on Metformin and Glimepiride    Sister with thyroid disease  SUBJECTIVE:   During the last visit (08/17/2019): A1c 10.7 %. We started Trulicity and continued metformin and Glimepiride  Today (03/28/2020): [name] is here for a follow up on diabetes. She has not been seen in 6 months, she missed her follow up back in 11/2018.  She checks her blood sugars occasionally. The patient has had hypoglycemic episodes since the last clinic visit, which typically occur rarely     Recent COVID infection requiring infusion. She continues with occasional cough and fatigue    HOME DIABETES REGIMEN:  Trulicity 6.96 mg weekly - ran out trulicity  Glimepiride 4 mg daily  Metformin 500 mg ,4 tablet daily     Statin: No ACE-I/ARB: Allergic to lisinopril- cough     GLUCOSE LOG:  Around 120-140 mg/dL    DIABETIC COMPLICATIONS: Microvascular complications:   Neuropathy  Denies: CKD, retinopathy  Last Eye Exam: Completed 2019  Macrovascular complications:    Denies: CAD, CVA, PVD   HISTORY:  Past Medical History:  Past Medical History:   Diagnosis Date  . Asthma   . Diabetes mellitus without complication ([place])   . Dilated aortic root (HCC)    6mm by CTA 07/2018  . Fatty liver   . Hypertension   . Morbid obesity with BMI of 40.0-44.9, adult ([place])   . OSA (obstructive sleep apnea)    mild with AHI 11/hr now on CPAP at 20cm H2O   Past Surgical History:  Past Surgical History:  Procedure Laterality Date  . ABDOMINAL HYSTERECTOMY    . BACK SURGERY     l4-5  . CARPAL TUNNEL RELEASE     right  . SHOULDER SURGERY    . SPINAL CORD STIMULATOR IMPLANT      Social History:  reports that she has quit smoking. She has never used smokeless tobacco. She reports current alcohol use. She reports that she does not use drugs. Family History:  Family History  Problem Relation Age of Onset  . Hypertension Mother   . Diabetes Mother   . Lymphoma Mother   . Breast cancer Mother   . Hypertension Father   . Diabetes Father   . Breast cancer Maternal Grandmother      HOME MEDICATIONS: Allergies as of 03/28/2020   No Known Allergies     Medication List       Accurate as of March 28, 2020  7:55 AM. If you have any questions, ask your nurse or doctor.        amLODipine 10 MG tablet Commonly known  as: NORVASC Take 10 mg by mouth every morning.   aspirin EC 81 MG tablet Take 1 tablet (81 mg total) by mouth daily.   CALCIUM 600 + D PO Take 2 tablets by mouth every morning.   carboxymethylcellulose 0.5 % Soln Commonly known as: REFRESH PLUS Place 1 drop into both eyes 3 (three) times daily as needed (dry eyes).   ciprofloxacin 500 MG tablet Commonly known as: CIPRO Take 1 tablet (500 mg total) by mouth every 12 (twelve) hours.   glimepiride 4 MG tablet Commonly known as: AMARYL Take 4 mg by mouth daily.   meloxicam 15 MG tablet Commonly known as: MOBIC Take 15 mg by mouth as needed.   metFORMIN 500 MG 24 hr tablet Commonly known as: GLUCOPHAGE-XR Take 2,000 mg by mouth daily with supper.   metoprolol  200 MG 24 hr tablet Commonly known as: TOPROL-XL Take 200 mg by mouth every evening.   naproxen 500 MG tablet Commonly known as: NAPROSYN Take 500 mg by mouth 2 (two) times daily with a meal.   ofloxacin 0.3 % OTIC solution Commonly known as: FLOXIN Place 5 drops into the right ear 2 (two) times daily.   OneTouch Verio test strip Generic drug: glucose blood Use as instructed to test blood sugar 2 times daily E11.65   oxyCODONE-acetaminophen 5-325 MG tablet Commonly known as: PERCOCET/ROXICET Take 2 tablets by mouth every 4 (four) hours as needed for severe pain.   triamcinolone cream 0.1 % Commonly known as: KENALOG APPLY A SMALL AMOUNT TO THE AFFECTED AREA THREE TIMES DAILY   Trulicity 3.54 SF/6.8LE Sopn Generic drug: Dulaglutide ADMINISTER 0.75 MG UNDER THE SKIN 1 TIME A WEEK        OBJECTIVE:   Vital Signs: BP 132/80 (BP Location: Left Arm, Patient Position: Sitting, Cuff Size: Normal)   Pulse 77   Ht 5\' 6"  (1.676 m)   Wt 250 lb 12.8 oz (113.8 kg)   SpO2 94%   BMI 40.48 kg/m   Wt Readings from Last 3 Encounters:  03/28/20 250 lb 12.8 oz (113.8 kg)  03/04/20 250 lb 9.6 oz (113.7 kg)  01/30/20 250 lb (113.4 kg)     Exam: General: Pt appears well and is in NAD  Lungs: Clear with good BS bilat with no rales, rhonchi, or wheezes  Heart: RRR with normal S1 and S2 and no gallops; no murmurs; no rub  Abdomen: Normoactive bowel sounds, soft, nontender, without masses or organomegaly palpable  Extremities: Trace pretibial edema  Neuro: MS is good with appropriate affect, pt is alert and Ox3     DM foot exam: 03/28/2020  The skin of the feet is intact without sores or ulcerations. The pedal pulses are 2+ on right and 2+ on left. The sensation is intact to a screening 5.07, 10 gram monofilament bilaterally   DATA REVIEWED:  Lab Results  Component Value Date   HGBA1C 8.4 (A) 03/28/2020   Lab Results  Component Value Date   CREATININE 0.66 06/29/2018    05/01/2019  BUN/Cr 13/0.79  GFR 76 TG 121 LDL 105    09/16/2018 Ma/Cr ratio 27        In office Bg 322 mg/dL  ASSESSMENT / PLAN / RECOMMENDATIONS:   1) Type 2 Diabetes Mellitus, with Improved glycemic control, With Neuropathic complications - Most recent A1c of 8.4 %. Goal A1c < 7.0 %.     - A1c down from 10.7 %  - When she was on Trulicity she noted BG's  in 80's. Will restart trulicity at a higher dose. Will reduce glimepiride to prevent hypoglycemia      MEDICATIONS: - Increase Trulicity to 1.5 mg weekly  - Decrease Glimepiride to 2 mg , 1 tablet daily  - Continue Metformin 1000 mg Twice daily   EDUCATION / INSTRUCTIONS:  BG monitoring instructions: Patient is instructed to check her blood sugars 1 times a day, fasting .  Call [facility] if: BG persistently < 70 . I reviewed the Rule of 15 for the treatment of hypoglycemia in detail with the patient. Literature supplied.   2) Diabetic complications:   Eye: Does not have known diabetic retinopathy. Pt urged to have an updated eye exam   Neuro/ Feet: Does  have known diabetic peripheral neuropathy .   Renal: Patient does not have known baseline CKD. She   is Allergic to Lisinopril - cough.  She is not on an ARB at present.    3) Dyslipidemia:  - she is not on statins. Her last LDL was elevated at 105 mg/dL.We discussed ADAD guidelines for statin use in diabetic population, we also discussed risks and benefits.    Medication Atorvastatin 10 mg daily   F/U in 6 months    Signed electronically by: Mack Guise, MD  [facility]  [address] [address] [place], [place] [address] Phone: [phone] FAX: [phone]   CC: Maurice Small, MD [address] [place] [address] Phone: [phone]  Fax: [phone]  Return to Endocrinology clinic as below: No future appointments.

## 2020-04-10 ENCOUNTER — Other Ambulatory Visit: Payer: Self-pay

## 2020-04-10 ENCOUNTER — Ambulatory Visit (HOSPITAL_COMMUNITY)
Admission: EM | Admit: 2020-04-10 | Discharge: 2020-04-10 | Disposition: A | Discharge status: Home / Self Care | Payer: Medicare Other | Attending: Family Medicine | Admitting: Family Medicine

## 2020-04-10 ENCOUNTER — Encounter (HOSPITAL_COMMUNITY): Payer: Self-pay | Admitting: Emergency Medicine

## 2020-04-10 DIAGNOSIS — G44319 Acute post-traumatic headache, not intractable: Secondary | ICD-10-CM

## 2020-04-10 DIAGNOSIS — M5442 Lumbago with sciatica, left side: Secondary | ICD-10-CM | POA: Diagnosis not present

## 2020-04-10 MED ORDER — TIZANIDINE HCL 4 MG PO TABS: 4.0000 mg | ORAL_TABLET | Freq: Three times a day (TID) | ORAL | 0 refills | Status: DC | PRN

## 2020-04-10 MED ORDER — HYDROCODONE-ACETAMINOPHEN 5-325 MG PO TABS: 1.0000 | ORAL_TABLET | Freq: Four times a day (QID) | ORAL | 0 refills | Status: DC | PRN
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
## 2020-04-12 NOTE — Progress Notes (Signed)
No show

## 2020-04-22 ENCOUNTER — Ambulatory Visit
Admission: RE | Admit: 2020-04-22 | Discharge: 2020-04-22 | Disposition: A | Discharge status: Home / Self Care | Payer: Managed Care, Other (non HMO) | Source: Ambulatory Visit | Attending: Family Medicine | Admitting: Family Medicine

## 2020-04-22 ENCOUNTER — Other Ambulatory Visit: Payer: Self-pay

## 2020-04-22 DIAGNOSIS — M545 Low back pain, unspecified: Secondary | ICD-10-CM | POA: Diagnosis not present

## 2020-04-22 DIAGNOSIS — Z1231 Encounter for screening mammogram for malignant neoplasm of breast: Secondary | ICD-10-CM

## 2020-04-29 ENCOUNTER — Telehealth: Payer: Self-pay | Admitting: Cardiology

## 2020-04-29 NOTE — Telephone Encounter (Signed)
Spoke to pt who has been having intermittent chest pain for several weeks, since she had COVID. She states it feels like "a bean bag hitting her chest", it comes on suddenly and randomly and lasts about ten minutes. If she rubs the area she can hear a "rattling" that she feels sounds like fluid. She also noticed her HR is elevated at night, no other symptoms. An appt has been made for her to be seen tomorrow morning.

## 2020-04-29 NOTE — Telephone Encounter (Signed)
Pt c/o of Chest Pain: STAT if CP now or developed within 24 hours  1. Are you having CP right now? No, is having back left shoulder pain now   2. Are you experiencing any other symptoms (ex. SOB, nausea, vomiting, sweating)? Elevated HR that gets up to 133-134 (not occurring rn)   3. How long have you been experiencing CP? Couple of weeks, since having Covid   4. Is your CP continuous or coming and going? Coming and Going   5. Have you taken Nitroglycerin? No   Pt has an appt scheduled for tomorrow, 04/30/20 at 10:15 am with Sharee Pimple in regards to this.   ?

## 2020-04-30 ENCOUNTER — Ambulatory Visit (INDEPENDENT_AMBULATORY_CARE_PROVIDER_SITE_OTHER): Payer: Managed Care, Other (non HMO) | Admitting: Cardiology

## 2020-04-30 ENCOUNTER — Other Ambulatory Visit: Payer: Self-pay

## 2020-04-30 ENCOUNTER — Encounter: Payer: Self-pay | Admitting: Cardiology

## 2020-04-30 VITALS — BP 154/96 | HR 72 | Ht 66.0 in | Wt 250.0 lb

## 2020-04-30 DIAGNOSIS — Z6841 Body Mass Index (BMI) 40.0 and over, adult: Secondary | ICD-10-CM

## 2020-04-30 DIAGNOSIS — R0789 Other chest pain: Secondary | ICD-10-CM

## 2020-04-30 DIAGNOSIS — I10 Essential (primary) hypertension: Secondary | ICD-10-CM | POA: Diagnosis not present

## 2020-04-30 DIAGNOSIS — R079 Chest pain, unspecified: Secondary | ICD-10-CM | POA: Diagnosis not present

## 2020-04-30 DIAGNOSIS — I77810 Thoracic aortic ectasia: Secondary | ICD-10-CM

## 2020-04-30 MED ORDER — IBUPROFEN 600 MG PO TABS: 600.0000 mg | ORAL_TABLET | Freq: Three times a day (TID) | ORAL | 0 refills | Status: AC

## 2020-04-30 MED ORDER — COLCHICINE 0.6 MG PO TABS: 0.6000 mg | ORAL_TABLET | Freq: Two times a day (BID) | ORAL | 3 refills | Status: DC

## 2020-04-30 NOTE — Patient Instructions (Addendum)
Medication Instructions:  Your physician has recommended you make the following change in your medication:  1. START IBUPROFEN 600 MG THREE TIMES A DAY FOR 2 WEEKS.  2. START COLCHICNE 0.6 MG TWICE DAILY *If you need a refill on your cardiac medications before your next appointment, please call your pharmacy*   Lab Work: TODAY: CRP If you have labs (blood work) drawn today and your tests are completely normal, you will receive your results only by: Marland Kitchen MyChart Message (if you have MyChart) OR . A paper copy in the mail If you have any lab test that is abnormal or we need to change your treatment, we will call you to review the results.   Testing/Procedures: Your physician has requested that you have an echocardiogram. Echocardiography is a painless test that uses sound waves to create images of your heart. It provides your doctor with information about the size and shape of your heart and how well your heart's chambers and valves are working. This procedure takes approximately one hour. There are no restrictions for this procedure.  Follow-Up: At [facility], you and your health needs are our priority.  As part of our continuing mission to provide you with exceptional heart care, we have created designated Provider Care Teams.  These Care Teams include your primary Cardiologist (physician) and Advanced Practice Providers (APPs -  Physician Assistants and Nurse Practitioners) who all work together to provide you with the care you need, when you need it.  We recommend signing up for the patient portal called "MyChart".  Sign up information is provided on this After Visit Summary.  MyChart is used to connect with patients for Virtual Visits (Telemedicine).  Patients are able to view lab/test results, encounter notes, upcoming appointments, etc.  Non-urgent messages can be sent to your provider as well.   To learn more about what you can do with MyChart, go to [email or web address].    Your  next appointment:   2 week(s)  The format for your next appointment:   In Person  Provider:   You may see Fransico Him, MD or one of the following Advanced Practice Providers on your designated Care Team:    Truitt Merle, NP  Cecilie Kicks, NP  Kathyrn Drown, NP

## 2020-04-30 NOTE — Addendum Note (Signed)
Addended by: Jacinta Shoe on: 04/30/2020 02:37 PM   Modules accepted: Orders

## 2020-04-30 NOTE — Progress Notes (Signed)
Cardiology Office Note   Date:  04/30/2020   ID:  [name], DOB Oct 29, 1965, MRN [id number]  PCP:  Maurice Small, MD  Cardiologist:  Dr. Radford Pax, MD   Chief Complaint  Patient presents with  . Chest Pain    History of Present Illness: [name] is a 54 y.o. female who presents for the evaluation of chest pain, seen for Dr. Radford Pax.   [name] has a hx of HTN, DM2, OSA on CPAP, and family hx of CAD. In 02/2016 she was admitted for chest pain and ruled out for MI. She had a negative NST and normal echo with an EF at 55-60%. She also underwent coronary CTA 07/2018 with a calcium score of <1, 83rd percentile for age and sex matched control. There was normal RCA.   She was last seen by Dr. Radford Pax and was doing well from a CV standpoint.  She then called the office 04/29/20 with c/o chest pain since having COVID. Feels as if a bean bag is hitting her chest. Episodes come on suddenly and last 10 minutes in duration. Also has been having elevated HR in the 130bpm range.   [name] presents today stating that since having Covid the end of September she has been experiencing intermittent chest discomfort. She states that the pain is more like an anterior chest pressure which worsens with lying flat and sometimes with deep inspiration. She has some orthopnea symptoms, relieved with sitting up. She states she can sometimes rub on her chest to relieve the symptoms. There is no exertional qualities. She has very mild shortness of breath. There is no diaphoresis, nausea, vomiting, LE edema. Discussed suspicion of pericarditis/myocarditis/perimyocarditis. She underwent coronary evaluation 07/2018 with no significant evidence of CAD therefore low suspicion for coronary etiology.  Case discussed with Dr. Radford Pax with plans for echocardiogram, chest CT and inflammatory markers with close follow-up.  Past Medical History:  Diagnosis Date  . Asthma   . Diabetes mellitus  without complication ([place])   . Dilated aortic root ([facility])    27mm by CTA 07/2018  . Fatty liver   . Hypertension   . Morbid obesity with BMI of 40.0-44.9, adult ([place])   . OSA (obstructive sleep apnea)    mild with AHI 11/hr now on CPAP at 20cm H2O    Past Surgical History:  Procedure Laterality Date  . ABDOMINAL HYSTERECTOMY    . BACK SURGERY     l4-5  . CARPAL TUNNEL RELEASE     right  . SHOULDER SURGERY    . SPINAL CORD STIMULATOR IMPLANT       Current Outpatient Medications  Medication Sig Dispense Refill  . Albuterol Sulfate (PROAIR RESPICLICK) 128 (90 Base) MCG/ACT AEPB INHALE 1 PUFF BY MOUTH EVERY 4 HOURS AS NEEDED    . amLODipine (NORVASC) 10 MG tablet Take 10 mg by mouth every morning.     Marland Kitchen aspirin EC 81 MG tablet Take 1 tablet (81 mg total) by mouth daily.    Marland Kitchen atorvastatin (LIPITOR) 10 MG tablet Take 1 tablet (10 mg total) by mouth daily. 90 tablet 3  . Calcium Carbonate-Vitamin D (CALCIUM 600 + D PO) Take 2 tablets by mouth every morning.     . carboxymethylcellulose (REFRESH PLUS) 0.5 % SOLN Place 1 drop into both eyes 3 (three) times daily as needed (dry eyes).    . Dulaglutide (TRULICITY) 1.5 NO/6.7EH SOPN Inject 1.5 mg into the skin once a week. 2  mL 11  . gabapentin (NEURONTIN) 300 MG capsule Take 300 mg by mouth 2 (two) times daily.    Marland Kitchen glimepiride (AMARYL) 2 MG tablet Take 1 tablet (2 mg total) by mouth daily before breakfast. 90 tablet 3  . glucose blood (ONETOUCH VERIO) test strip Use as instructed to test blood sugar 2 times daily E11.65 100 each 12  . HYDROcodone-acetaminophen (NORCO/VICODIN) 5-325 MG tablet Take 1 tablet by mouth every 6 (six) hours as needed for moderate pain. 15 tablet 0  . meloxicam (MOBIC) 15 MG tablet Take 15 mg by mouth as needed.    . metFORMIN (GLUCOPHAGE-XR) 500 MG 24 hr tablet Take 2,000 mg by mouth daily with supper.    . metoprolol (TOPROL-XL) 200 MG 24 hr tablet Take 200 mg by mouth every evening.     Marland Kitchen tiZANidine (ZANAFLEX) 4  MG tablet Take 1 tablet (4 mg total) by mouth every 8 (eight) hours as needed for muscle spasms. DO NOT DRINK ALCOHOL OR DRIVE WHILE TAKING THIS MEDICATION 30 tablet 0  . triamcinolone cream (KENALOG) 0.1 % APPLY A SMALL AMOUNT TO THE AFFECTED AREA THREE TIMES DAILY    . colchicine 0.6 MG tablet Take 1 tablet (0.6 mg total) by mouth 2 (two) times daily. 180 tablet 3  . ibuprofen (ADVIL) 600 MG tablet Take 1 tablet (600 mg total) by mouth 3 (three) times daily for 14 days. 42 tablet 0   No current facility-administered medications for this visit.    Allergies:   Patient has no known allergies.    Social History:  The patient  reports that she has quit smoking. She has never used smokeless tobacco. She reports current alcohol use. She reports that she does not use drugs.   Family History:  The patient's family history includes Breast cancer in her maternal grandmother and mother; Diabetes in her father and mother; Hypertension in her father and mother; Lymphoma in her mother.    ROS:  Please see the history of present illness.Otherwise, review of systems are positive for none.   All other systems are reviewed and negative.    PHYSICAL EXAM: VS:  BP (!) 154/96   Pulse 72   Ht 5\' 6"  (1.676 m)   Wt 250 lb (113.4 kg)   SpO2 96%   BMI 40.35 kg/m  , BMI Body mass index is 40.35 kg/m.   General: Well developed, well nourished, NAD Neck: Negative for carotid bruits. No JVD Lungs:Clear to ausculation bilaterally. No wheezes, rales, or rhonchi. Breathing is unlabored. Cardiovascular: RRR with S1 S2. No murmurs or rubs Extremities: No edema. Radial pulses 2+ bilaterally Neuro: Alert and oriented. No focal deficits. No facial asymmetry. MAE spontaneously. Psych: Responds to questions appropriately with normal affect.     EKG:  EKG is ordered today. The ekg ordered today demonstrates NSR with no ST-T wave changes   Recent Labs: No results found for requested labs within last 8760 hours.      Lipid Panel No results found for: CHOL, TRIG, HDL, CHOLHDL, VLDL, LDLCALC, LDLDIRECT    Wt Readings from Last 3 Encounters:  04/30/20 250 lb (113.4 kg)  03/28/20 250 lb 12.8 oz (113.8 kg)  03/04/20 250 lb 9.6 oz (113.7 kg)     Other studies Reviewed: Additional studies/ records that were reviewed today include:  Review of the above records demonstrates:     cCTA 07/2018:  IMPRESSION: 1.  Mildly dilated aortic root 3.8 cm  2. Calcium score <1 which is 83  rd percentile for age as most people in this age range have scores of 0  3.  Normal right dominant coronary arteries  ASSESSMENT AND PLAN:  1.  Chest pain: -Presents today stating that since having Covid the end of September she has been experiencing intermittent chest discomfort. She states that the pain is more like an anterior chest pressure which worsens with lying flat and sometimes with deep inspiration. She has some orthopnea symptoms, relieved with sitting up. She states she can sometimes rub on her chest to relieve the symptoms. There is no exertional qualities. She has very mild shortness of breath. There is no diaphoresis, nausea, vomiting, LE edema.  -Discussed suspicion of pericarditis/myocarditis/perimyocarditis.  There was also suspicion for possible pulmonary embolism given recent Covid, current symptoms and elevated heart rate.  She underwent coronary evaluation 07/2018 with no significant evidence of CAD therefore low suspicion for coronary etiology.   -Case discussed with Dr. Radford Pax with plans for echocardiogram, chest CT and inflammatory markers with close follow-up.  2.  HTN -Elevated, 154/96 BP borderline controlled on exam -Continue current regimen  3.  Dilated aortic root -noted on coronary CTA at 66mm but 2D echoes have all been normal  4. Recent Covid infection: -Did not require hospitalization  Current medicines are reviewed at length with the patient today.  The patient does not have  concerns regarding medicines.  The following changes have been made: Add ibuprofen 600 mg 3 times daily and colchicine 0.6 mg  Labs/ tests ordered today include: CRP, echocardiogram and will add chest CT along with sed rate  Orders Placed This Encounter  Procedures  . C-reactive protein  . EKG 12-Lead  . ECHOCARDIOGRAM COMPLETE    Disposition:   FU with myself in 2 weeks  Signed, Kathyrn Drown, NP  04/30/2020 12:51 PM    [facility] [place], [place], [place]  [address] Phone: [phone]; Fax: [phone]

## 2020-05-01 ENCOUNTER — Other Ambulatory Visit: Payer: Self-pay

## 2020-05-01 ENCOUNTER — Other Ambulatory Visit: Payer: Managed Care, Other (non HMO) | Admitting: *Deleted

## 2020-05-01 DIAGNOSIS — R079 Chest pain, unspecified: Secondary | ICD-10-CM

## 2020-05-01 DIAGNOSIS — R0789 Other chest pain: Secondary | ICD-10-CM

## 2020-05-01 LAB — SEDIMENTATION RATE: Sed Rate: 5 mm/hr (ref 0–40)

## 2020-05-01 LAB — C-REACTIVE PROTEIN: CRP: 3 mg/L (ref 0–10)

## 2020-05-02 ENCOUNTER — Telehealth: Payer: Self-pay | Admitting: Cardiology

## 2020-05-02 NOTE — Telephone Encounter (Signed)
Patient called to get her results

## 2020-05-03 NOTE — Telephone Encounter (Signed)
I left a message for the patient to return my call to review results.

## 2020-05-03 NOTE — Telephone Encounter (Signed)
Update I have spoke with pt and reviewed results who verbalized understanding.

## 2020-05-15 DIAGNOSIS — Z01812 Encounter for preprocedural laboratory examination: Secondary | ICD-10-CM | POA: Diagnosis not present

## 2020-05-15 DIAGNOSIS — M25512 Pain in left shoulder: Secondary | ICD-10-CM | POA: Diagnosis not present

## 2020-05-21 ENCOUNTER — Other Ambulatory Visit: Payer: Self-pay

## 2020-05-21 ENCOUNTER — Encounter: Payer: Self-pay | Admitting: Neurology

## 2020-05-21 ENCOUNTER — Telehealth: Payer: Self-pay | Admitting: Neurology

## 2020-05-21 ENCOUNTER — Other Ambulatory Visit: Payer: Medicare Other

## 2020-05-21 ENCOUNTER — Ambulatory Visit (INDEPENDENT_AMBULATORY_CARE_PROVIDER_SITE_OTHER): Payer: Managed Care, Other (non HMO) | Admitting: Neurology

## 2020-05-21 ENCOUNTER — Encounter: Payer: Self-pay | Admitting: Psychology

## 2020-05-21 VITALS — BP 159/96 | HR 76 | Ht 66.0 in | Wt 254.0 lb

## 2020-05-21 DIAGNOSIS — H539 Unspecified visual disturbance: Secondary | ICD-10-CM

## 2020-05-21 DIAGNOSIS — G4484 Primary exertional headache: Secondary | ICD-10-CM

## 2020-05-21 DIAGNOSIS — R5383 Other fatigue: Secondary | ICD-10-CM | POA: Diagnosis not present

## 2020-05-21 DIAGNOSIS — F0781 Postconcussional syndrome: Secondary | ICD-10-CM

## 2020-05-21 DIAGNOSIS — S0990XA Unspecified injury of head, initial encounter: Secondary | ICD-10-CM | POA: Diagnosis not present

## 2020-05-21 DIAGNOSIS — Z87820 Personal history of traumatic brain injury: Secondary | ICD-10-CM | POA: Diagnosis not present

## 2020-05-21 DIAGNOSIS — R4189 Other symptoms and signs involving cognitive functions and awareness: Secondary | ICD-10-CM | POA: Diagnosis not present

## 2020-05-21 DIAGNOSIS — R4701 Aphasia: Secondary | ICD-10-CM | POA: Diagnosis not present

## 2020-05-21 DIAGNOSIS — E538 Deficiency of other specified B group vitamins: Secondary | ICD-10-CM

## 2020-05-21 NOTE — Telephone Encounter (Signed)
Medicare/[employer] order sent to [facility]. They will obtain the auth for [employer] and reach out to the patient to schedule.

## 2020-05-21 NOTE — Patient Instructions (Addendum)
Rest is important in concussion recovery. Recommend taking frequent breaks. No strenuous activity, limiting computer and reading time. heating pad for muscular relief Occupational Therapy for concussion and cognitive therapy Speech therapy for expressive aphasia after concussion Formal Neuropsych testing: Please schedule testing no sooner than 4 months to allow recovery from current concussion. Rest, rest, rest May take upwards of 6 months to recover, we will order formal memory testing to be scheduled 4 months or later from now Follow up 6 months - MRI of the brain: - blood work - recommend eye evaluation  Post-Concussion Syndrome  A concussion is a brain injury from a direct hit (blow) to your head or body. This blow causes your brain to shake quickly back and forth inside your skull. This can damage brain cells and cause chemical changes in your brain. Concussions are usually not life-threatening but can cause several serious symptoms. Post-concussion syndrome is when symptoms that occur after a concussion last longer than normal. These symptoms can last from weeks to months. What are the causes? The cause of this condition is not known. It can happen whether your head injury was mild or severe. What increases the risk? You are more likely to develop this condition if:  You are female.  You are a child, teen, or young adult.  You had a past head injury.  You have a history of headaches.  You have depression or anxiety. What are the signs or symptoms? Physical symptoms  Headaches.  Tiredness.  Dizziness.  Weakness.  Blurry vision.  Sensitivity to light.  Hearing difficulties. Mental and emotional symptoms  Memory difficulties.  Difficulty with concentration.  Difficulty sleeping or staying asleep.  Feeling irritable.  Anxiety or depression.  Difficulty learning new things. How is this diagnosed? This condition may be diagnosed based on:  Your symptoms.   A description of your injury.  Your medical history. Your health care provider may order other tests such as:  Brain function tests (neurological testing).  CT scan. How is this treated? Treatment for this condition may depend on your symptoms. Symptoms usually go away on their own over time. Treatments may include:  Medicines for headaches.  Resting your brain and body for a few days after your injury.  Rehabilitation therapy, such as: ? Physical or occupational therapy. This may include exercises to help with balance and dizziness. ? Mental health counseling. ? Speech therapy. ? Vision therapy. A brain and eye specialist can recommend treatments for vision problems. Follow these instructions at home: Medicines  Take over-the-counter and prescription medicines only as told by your health care provider.  Avoid opioid prescription pain medicines when recovering from a concussion. Activity  Limit your mental activities for the first few days after your injury, such as: ? Homework or job-related work. ? Complex thinking. ? Watching TV, and using a computer or phone. ? Playing memory games and puzzles. ? Gradually return to your normal activity level. If a certain activity brings on your symptoms, stop or slow down until you can do the activity without it triggering your symptoms.  Limit physical activity, such as exercise or sports, for the first few days after a concussion. Gradually return to normal activity as told by your health care provider. ? If a certain activity brings on your symptoms, stop or slow down until you can do the activity without it triggering your symptoms.  Rest. Rest helps your brain heal. Make sure you: ? Get plenty of sleep at night. Most adults should  get at least 7-9 hours of sleep each night. ? Rest during the day. Take naps or rest breaks when you feel tired.  Do not do high-risk activities that could cause a second concussion, such as riding a  bike or playing sports. Having another concussion before the first one has healed can be dangerous. General instructions  Do not drink alcohol until your health care provider says you can.  Keep track of the frequency and the severity of your symptoms. Give this information to your health care provider.  Keep all follow-up visits as directed by your health care provider. This is important. Contact a health care provider if:  Your symptoms do not improve.  You have another injury. Get help right away if you:  Have a severe or worsening headache.  Are confused.  Have trouble staying awake.  Pass out.  Vomit.  Have weakness or numbness in any part of your body.  Have a seizure.  Have trouble speaking. Summary  Post-concussion syndrome is when symptoms that occur after a concussion last longer than normal.  Symptoms usually go away on their own over time. Depending on your symptoms, you may need treatment, such as medicines or rehabilitation therapy.  Rest your brain and body for a few days after your injury. Gradually return to activities, as told by your health care provider.  Get plenty of sleep, and avoid alcohol and opioid pain medicines while recovering from a concussion. This information is not intended to replace advice given to you by your health care provider. Make sure you discuss any questions you have with your health care provider. Document Revised: 03/24/2018 Document Reviewed: 07/06/2017 Elsevier Patient Education  2020 [employer].
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
## 2020-05-27 NOTE — Progress Notes (Signed)
Virtual Visit via Telephone Note   This visit type was conducted due to national recommendations for restrictions regarding the COVID-19 Pandemic (e.g. social distancing) in an effort to limit this patient's exposure and mitigate transmission in our community.  Due to her co-morbid illnesses, this patient is at least at moderate risk for complications without adequate follow up.  This format is felt to be most appropriate for this patient at this time.  The patient did not have access to video technology/had technical difficulties with video requiring transitioning to audio format only (telephone).  All issues noted in this document were discussed and addressed.  No physical exam could be performed with this format.  Please refer to the patient's chart for her  consent to telehealth for [facility].    Date:  05/29/2020   ID:  [name], DOB [birth year], MRN [id number] The patient was identified using 2 identifiers.  Patient Location: Home Provider Location: Home Office  PCP:  Maurice Small, MD  Cardiologist:  Fransico Him, MD  Electrophysiologist:  None   Evaluation Performed:  Follow-Up Visit  Chief Complaint:  Follow up   History of Present Illness:    [name] is a 54 y.o. female with a hx of HTN, DM2, OSA on CPAP, and family hx of CAD. In 02/2016 she was admitted for chest pain and ruled out for MI. She had a negative NST and normal echo with an EF at 55-60%. She also underwent coronary CTA 07/2018 with a calcium score of <1, 83rd percentile for age and sex matched control. There was normal RCA.   She was last seen by Dr. Radford Pax and was doing well from a CV standpoint.  She then called the office 04/29/20 with c/o chest pain since having COVID. She was set up with an acute appointment with myself. She reported that since having Covid the end of September she had been experiencing intermittent chest discomfort. She stated that the pressure was  worse with lying flat and sometimes with deep inspiration. She had some orthopnea symptoms, relieved with sitting up. We discussed the suspicion of pericarditis/myocarditis/perimyocarditis. She underwent coronary evaluation 07/2018 with no significant evidence of CAD therefore low suspicion for coronary etiology. Case discussed with Dr. Radford Pax with plans for echocardiogram, chest CT and inflammatory markers with close follow-up.  Echocardiogram plan for 05/28/2020 that showed an LVEF at 55-60% with no RWMA, G1DD and no valvular disease. Chest CT with mosaic pattern of ground-glass attenuation noted throughout both lungs. Findings could be due to atypical/viral pneumonia or small airways disease such as asthma, constrictive bronchiolitis or respiratory bronchiolitis. No pleural effusions. No focal airspace consolidation or worrisome pulmonary lesions.  There was no pericardial effusion noted. On assessment today she reports that despite the above findings, her symptoms improved with the addition of NSAIDS x 2 weeks and once she stopped, her symptoms have slowly returned. She states that she continues to have chest pain however it is better than when we last spoke. I have discussed the pulmonary findings with her which could be secondary to smoking hx or recent COVID in 02/2020. She will need to follow with her PCP regarding these findings. I will discuss plan with Dr. Radford Pax if there would be any utility in obtaining cardiac MRI given symptom improvement on NSAIDs despite no real findings on workup thus far.   The patient does not have symptoms concerning for COVID-19 infection (fever, chills, cough, or new shortness of breath).  Past Medical History:  Diagnosis Date   Anxiety    per notes from Dr Rolena Infante (orthopedics)   Asthma    Depression    Diabetes mellitus without complication ([place])    Dilated aortic root ([place])    53mm by CTA 07/2018   Fatty liver    H/O cluster headache    High  cholesterol    Hypertension    Morbid obesity with BMI of 40.0-44.9, adult (HCC)    OSA (obstructive sleep apnea)    mild with AHI 11/hr now on CPAP at 20cm H2O   Swelling    legs/feet/hands   Past Surgical History:  Procedure Laterality Date   ABDOMINAL HYSTERECTOMY  2006   BACK SURGERY     l4-5   BACK SURGERY  2006   x2   CARPAL TUNNEL RELEASE     right   CESAREAN SECTION  1994 and 2005   KNEE SURGERY  06/15/1985   SHOULDER SURGERY Left 2020 and 2021   SHOULDER SURGERY Right 2007   SPINAL CORD STIMULATOR IMPLANT  2012   battery replacement 2017 and 2021     Current Meds  Medication Sig   Albuterol Sulfate (PROAIR RESPICLICK) 408 (90 Base) MCG/ACT AEPB INHALE 1 PUFF BY MOUTH EVERY 4 HOURS AS NEEDED   amLODipine (NORVASC) 10 MG tablet Take 10 mg by mouth every morning.    aspirin EC 81 MG tablet Take 1 tablet (81 mg total) by mouth daily.   atorvastatin (LIPITOR) 10 MG tablet Take 1 tablet (10 mg total) by mouth daily.   Calcium Carbonate-Vitamin D (CALCIUM 600 + D PO) Take 2 tablets by mouth every morning.    carboxymethylcellulose (REFRESH PLUS) 0.5 % SOLN Place 1 drop into both eyes 3 (three) times daily as needed (dry eyes).   colchicine 0.6 MG tablet Take 1 tablet (0.6 mg total) by mouth 2 (two) times daily.   Dulaglutide (TRULICITY) 1.5 XK/4.8JE SOPN Inject 1.5 mg into the skin once a week.   gabapentin (NEURONTIN) 300 MG capsule Take 300 mg by mouth 2 (two) times daily.   glimepiride (AMARYL) 2 MG tablet Take 1 tablet (2 mg total) by mouth daily before breakfast.   glucose blood (ONETOUCH VERIO) test strip Use as instructed to test blood sugar 2 times daily E11.65   HYDROcodone-acetaminophen (NORCO/VICODIN) 5-325 MG tablet Take 1 tablet by mouth every 6 (six) hours as needed for moderate pain.   meloxicam (MOBIC) 15 MG tablet Take 15 mg by mouth daily.    metFORMIN (GLUCOPHAGE-XR) 500 MG 24 hr tablet Take 2,000 mg by mouth daily with supper.    metoprolol (TOPROL-XL) 200 MG 24 hr tablet Take 200 mg by mouth every evening.    tiZANidine (ZANAFLEX) 4 MG tablet Take 1 tablet (4 mg total) by mouth every 8 (eight) hours as needed for muscle spasms. DO NOT DRINK ALCOHOL OR DRIVE WHILE TAKING THIS MEDICATION   triamcinolone cream (KENALOG) 0.1 % as needed (flare ups).   [DISCONTINUED] ibuprofen (ADVIL) 600 MG tablet Take 600 mg by mouth 3 (three) times daily.     Allergies:   Patient has no known allergies.   Social History   Tobacco Use   Smoking status: Former Smoker    Packs/day: 1.00    Years: 10.00    Pack years: 10.00    Quit date: 2004    Years since quitting: 17.9   Smokeless tobacco: Never Used  Scientific laboratory technician Use: Never used  Substance Use  Topics   Alcohol use: Yes    Comment: socially    Drug use: No     Family Hx: The patient's family history includes Arthritis in her mother; Breast cancer in her mother and paternal grandmother; Diabetes in her brother, father, mother, and sister; Heart disease in her father and paternal grandfather; Hypertension in her brother, father, maternal aunt, maternal grandfather, maternal grandmother, maternal uncle, mother, paternal aunt, paternal grandfather, paternal grandmother, paternal uncle, and sister; Lymphoma in her mother; Prostate cancer in her father; Stomach cancer in her maternal grandmother.  ROS:   Please see the history of present illness.     All other systems reviewed and are negative.  Prior CV studies:   The following studies were reviewed today:  cCTA 07/2018:  IMPRESSION: 1. Mildly dilated aortic root 3.8 cm  2. Calcium score <1 which is 75 rd percentile for age as most people in this age range have scores of 0  3. Normal right dominant coronary arteries  Echo 05/29/20:  1. Left ventricular ejection fraction, by estimation, is 55 to 60%. The  left ventricle has normal function. The left ventricle has no regional  wall motion  abnormalities. The left ventricular internal cavity size was  mildly dilated. Left ventricular  diastolic parameters are consistent with Grade I diastolic dysfunction  (impaired relaxation).  2. Right ventricular systolic function is normal. The right ventricular  size is normal.  3. Left atrial size was mildly dilated.  4. The mitral valve is normal in structure. Trivial mitral valve  regurgitation. No evidence of mitral stenosis.  5. The aortic valve is tricuspid. Aortic valve regurgitation is not  visualized. No aortic stenosis is present.  6. The inferior vena cava is normal in size with greater than 50%  respiratory variability, suggesting right atrial pressure of 3 mmHg.   Labs/Other Tests and Data Reviewed:    EKG:  No ECG reviewed.  Recent Labs: 05/21/2020: ALT 32; BUN 8; Creatinine, Ser 0.68; Hemoglobin 13.4; Platelets 278; Potassium 4.1; Sodium 143; TSH 0.901   Recent Lipid Panel No results found for: CHOL, TRIG, HDL, CHOLHDL, LDLCALC, LDLDIRECT  Wt Readings from Last 3 Encounters:  05/29/20 245 lb (111.1 kg)  05/21/20 254 lb (115.2 kg)  04/30/20 250 lb (113.4 kg)     Objective:    Vital Signs:  Ht _0  (1.676 m)    Wt 245 lb (111.1 kg)    BMI 39.54 kg/m    VITAL SIGNS:  reviewed GEN:  no acute distress NEURO:  alert and oriented x 3, no obvious focal deficit PSYCH:  normal affect  ASSESSMENT & PLAN:    1.  Chest pain: -On re-assessment, she reports some improvement in her symptoms during the two weeks of NSAID therapy however symptoms returned after stopping. As above, imaging relatively unrevealing. I will discuss with Dr. Radford Pax if there is any utility in pursing a cMRI in the case. We will repeat high dose NSAID therapy x 2 weeks once again and follow closely  -Previously discussed the suspicion of pericarditis/myocarditis/perimyocarditis. -Case discussed with Dr. Radford Pax with plans for echocardiogram, chest CT and inflammatory markers with close  follow-up>>>Echocardiogram found to have an LVEF at 55-60% with G2DD, and no valvular disease. Chest CT with no pericardial effusion, no atherosclerosis or coronary calcifications. CRP at 6 and ESR at 5. No clear etioligy for chest pain at this time. Will need to consider other sources   2. HTN: -No BP available today  -Continue current regimen  3. Dilated aortic root: -Noted on coronary CTA at 88m but 2D echoes have all been normal  4. Recent Covid infection: -Did not require hospitalization  5. Chest CT pulmonary abnorlality: -Chest CT with mosaic pattern of ground-glass attenuation noted throughout both lungs. Findings could be due to atypical/viral pneumonia or small airways disease such as asthma, constrictive bronchiolitis or respiratory bronchiolitis. No pleural effusions. No focal airspace consolidation or worrisome pulmonary lesions.  Shared Decision Making/Informed Consent      COVID-19 Education: The signs and symptoms of COVID-19 were discussed with the patient and how to seek care for testing (follow up with PCP or arrange E-visit).  The importance of social distancing was discussed today.  Time:   Today, I have spent 20 minutes with the patient with telehealth technology discussing the above problems.     Medication Adjustments/Labs and Tests Ordered: Current medicines are reviewed at length with the patient today.  Concerns regarding medicines are outlined above.   Tests Ordered: No orders of the defined types were placed in this encounter.   Medication Changes: No orders of the defined types were placed in this encounter.   Follow Up:  Virtual Visit  with Dr. TRadford Paxor myself in 3-4 weeks   Signed, JKathyrn Drown NP  05/29/2020 11:31 AM    [place]

## 2020-05-28 ENCOUNTER — Ambulatory Visit (HOSPITAL_BASED_OUTPATIENT_CLINIC_OR_DEPARTMENT_OTHER): Payer: Managed Care, Other (non HMO)

## 2020-05-28 ENCOUNTER — Ambulatory Visit (HOSPITAL_COMMUNITY)
Admission: RE | Admit: 2020-05-28 | Discharge: 2020-05-28 | Disposition: A | Discharge status: Home / Self Care | Payer: Managed Care, Other (non HMO) | Source: Ambulatory Visit | Attending: Cardiology | Admitting: Cardiology

## 2020-05-28 ENCOUNTER — Other Ambulatory Visit: Payer: Self-pay

## 2020-05-28 DIAGNOSIS — I10 Essential (primary) hypertension: Secondary | ICD-10-CM | POA: Diagnosis not present

## 2020-05-28 DIAGNOSIS — R079 Chest pain, unspecified: Secondary | ICD-10-CM

## 2020-05-28 DIAGNOSIS — R0602 Shortness of breath: Secondary | ICD-10-CM | POA: Diagnosis not present

## 2020-05-28 DIAGNOSIS — R0789 Other chest pain: Secondary | ICD-10-CM | POA: Diagnosis not present

## 2020-05-28 DIAGNOSIS — J189 Pneumonia, unspecified organism: Secondary | ICD-10-CM | POA: Diagnosis not present

## 2020-05-28 LAB — ECHOCARDIOGRAM COMPLETE
Area-P 1/2: 5.02 cm2
S' Lateral: 3.6 cm

## 2020-05-28 MED ORDER — IOHEXOL 350 MG/ML SOLN
100.0000 mL | Freq: Once | INTRAVENOUS | Status: AC | PRN
  Administered 2020-05-28: 82 mL via INTRAVENOUS

## 2020-05-29 ENCOUNTER — Telehealth (INDEPENDENT_AMBULATORY_CARE_PROVIDER_SITE_OTHER): Payer: Managed Care, Other (non HMO) | Admitting: Cardiology

## 2020-05-29 VITALS — Ht 66.0 in | Wt 245.0 lb

## 2020-05-29 DIAGNOSIS — R079 Chest pain, unspecified: Secondary | ICD-10-CM

## 2020-05-29 DIAGNOSIS — I77810 Thoracic aortic ectasia: Secondary | ICD-10-CM | POA: Diagnosis not present

## 2020-05-29 DIAGNOSIS — I10 Essential (primary) hypertension: Secondary | ICD-10-CM | POA: Diagnosis not present

## 2020-05-29 LAB — B12 AND FOLATE PANEL
Folate: >20 ng/mL (ref >3.0)
Vitamin B-12: 531 pg/mL (ref 232–1245)

## 2020-05-29 LAB — COMPREHENSIVE METABOLIC PANEL
ALT: 32 IU/L (ref 0–32)
AST: 26 IU/L (ref 0–40)
Albumin/Globulin Ratio: 1.9 (ref 1.2–2.2)
Albumin: 4.5 g/dL (ref 3.8–4.9)
Alkaline Phosphatase: 75 IU/L (ref 44–121)
BUN/Creatinine Ratio: 12 (ref 9–23)
BUN: 8 mg/dL (ref 6–24)
Bilirubin Total: 0.3 mg/dL (ref 0.0–1.2)
CO2: 26 mmol/L (ref 20–29)
Calcium: 9.6 mg/dL (ref 8.7–10.2)
Chloride: 103 mmol/L (ref 96–106)
Creatinine, Ser: 0.68 mg/dL (ref 0.57–1.00)
GFR calc Af Amer: 115 mL/min/{1.73_m2} (ref >59)
GFR calc non Af Amer: 99 mL/min/{1.73_m2} (ref >59)
Globulin, Total: 2.4 g/dL (ref 1.5–4.5)
Glucose: 120 mg/dL — ABNORMAL HIGH (ref 65–99)
Potassium: 4.1 mmol/L (ref 3.5–5.2)
Sodium: 143 mmol/L (ref 134–144)
Total Protein: 6.9 g/dL (ref 6.0–8.5)

## 2020-05-29 LAB — CBC WITH DIFFERENTIAL/PLATELET
Basophils Absolute: 0 10*3/uL (ref 0.0–0.2)
Basos: 1 %
EOS (ABSOLUTE): 0.8 10*3/uL — ABNORMAL HIGH (ref 0.0–0.4)
Eos: 14 %
Hematocrit: 40.5 % (ref 34.0–46.6)
Hemoglobin: 13.4 g/dL (ref 11.1–15.9)
Immature Grans (Abs): 0 10*3/uL (ref 0.0–0.1)
Immature Granulocytes: 1 %
Lymphocytes Absolute: 1.8 10*3/uL (ref 0.7–3.1)
Lymphs: 33 %
MCH: 27.6 pg (ref 26.6–33.0)
MCHC: 33.1 g/dL (ref 31.5–35.7)
MCV: 84 fL (ref 79–97)
Monocytes Absolute: 0.4 10*3/uL (ref 0.1–0.9)
Monocytes: 7 %
Neutrophils Absolute: 2.4 10*3/uL (ref 1.4–7.0)
Neutrophils: 44 %
Platelets: 278 10*3/uL (ref 150–450)
RBC: 4.85 x10E6/uL (ref 3.77–5.28)
RDW: 13.6 % (ref 11.7–15.4)
WBC: 5.5 10*3/uL (ref 3.4–10.8)

## 2020-05-29 LAB — METHYLMALONIC ACID, SERUM: Methylmalonic Acid: 116 nmol/L (ref 0–378)

## 2020-05-29 LAB — TSH: TSH: 0.901 u[IU]/mL (ref 0.450–4.500)

## 2020-05-29 LAB — VITAMIN B1: Thiamine: 179 nmol/L (ref 66.5–200.0)

## 2020-05-29 NOTE — Patient Instructions (Signed)
Medication Instructions:  Your physician recommends that you continue on your current medications as directed. Please refer to the Current Medication list given to you today.  *If you need a refill on your cardiac medications before your next appointment, please call your pharmacy*   Lab Work: NONE If you have labs (blood work) drawn today and your tests are completely normal, you will receive your results only by: Marland Kitchen MyChart Message (if you have MyChart) OR . A paper copy in the mail If you have any lab test that is abnormal or we need to change your treatment, we will call you to review the results.   Testing/Procedures: NONE   Follow-Up: At [facility], you and your health needs are our priority.  As part of our continuing mission to provide you with exceptional heart care, we have created designated Provider Care Teams.  These Care Teams include your primary Cardiologist (physician) and Advanced Practice Providers (APPs -  Physician Assistants and Nurse Practitioners) who all work together to provide you with the care you need, when you need it.  We recommend signing up for the patient portal called "MyChart".  Sign up information is provided on this After Visit Summary.  MyChart is used to connect with patients for Virtual Visits (Telemedicine).  Patients are able to view lab/test results, encounter notes, upcoming appointments, etc.  Non-urgent messages can be sent to your provider as well.   To learn more about what you can do with MyChart, go to [email or web address].    Your next appointment:   3 week(s) ON January 12 th at 10:20 am.  The format for your next appointment:   In Person  Provider:   You may see Fransico Him, MD or one of the following Advanced Practice Providers on your designated Care Team:    Melina Copa, PA-C  Ermalinda Barrios, PA-C

## 2020-06-03 DIAGNOSIS — Z20822 Contact with and (suspected) exposure to covid-19: Secondary | ICD-10-CM | POA: Diagnosis not present

## 2020-06-03 DIAGNOSIS — Z01812 Encounter for preprocedural laboratory examination: Secondary | ICD-10-CM | POA: Diagnosis not present

## 2020-06-05 DIAGNOSIS — E785 Hyperlipidemia, unspecified: Secondary | ICD-10-CM | POA: Diagnosis not present

## 2020-06-05 DIAGNOSIS — I10 Essential (primary) hypertension: Secondary | ICD-10-CM | POA: Diagnosis not present

## 2020-06-05 DIAGNOSIS — M75112 Incomplete rotator cuff tear or rupture of left shoulder, not specified as traumatic: Secondary | ICD-10-CM | POA: Diagnosis not present

## 2020-06-05 DIAGNOSIS — Z6841 Body Mass Index (BMI) 40.0 and over, adult: Secondary | ICD-10-CM | POA: Diagnosis not present

## 2020-06-05 DIAGNOSIS — G8918 Other acute postprocedural pain: Secondary | ICD-10-CM | POA: Diagnosis not present

## 2020-06-05 DIAGNOSIS — J45909 Unspecified asthma, uncomplicated: Secondary | ICD-10-CM | POA: Diagnosis not present

## 2020-06-05 DIAGNOSIS — M65812 Other synovitis and tenosynovitis, left shoulder: Secondary | ICD-10-CM | POA: Diagnosis not present

## 2020-06-05 DIAGNOSIS — E119 Type 2 diabetes mellitus without complications: Secondary | ICD-10-CM | POA: Diagnosis not present

## 2020-06-05 DIAGNOSIS — M75102 Unspecified rotator cuff tear or rupture of left shoulder, not specified as traumatic: Secondary | ICD-10-CM | POA: Diagnosis not present

## 2020-06-05 DIAGNOSIS — E669 Obesity, unspecified: Secondary | ICD-10-CM | POA: Diagnosis not present

## 2020-06-19 DIAGNOSIS — E785 Hyperlipidemia, unspecified: Secondary | ICD-10-CM | POA: Diagnosis not present

## 2020-06-19 DIAGNOSIS — I10 Essential (primary) hypertension: Secondary | ICD-10-CM | POA: Diagnosis not present

## 2020-06-19 DIAGNOSIS — E1140 Type 2 diabetes mellitus with diabetic neuropathy, unspecified: Secondary | ICD-10-CM | POA: Diagnosis not present

## 2020-06-20 DIAGNOSIS — M7581 Other shoulder lesions, right shoulder: Secondary | ICD-10-CM | POA: Diagnosis not present

## 2020-06-25 NOTE — Progress Notes (Signed)
Date:  03/04/2020   ID:  Carma Dwiggins, DOB 1965-10-19, MRN [id number]   Cardiology Office Note:    Date:  06/26/2020   ID:  [name], DOB August 22, 1965, MRN [id number]  PCP:  Maurice Small, MD  Cardiologist:  Fransico Him, MD    Referring MD: Maurice Small, MD   Chief Complaint  Patient presents with  . Sleep Apnea  . Hypertension    History of Present Illness:    [name] is a 55 y.o. female with a hx of  HTN, T2DM, prior tobacco history, family h/o CAD and personal history ofobesity as well as a h/o OSA and asthma. In Sep. 2017, she was admitted for CP w/u. She ruled out for MI and had a negative NST and normal 2D echo. EF was 55-60%.  She also has OSA and is using CPAP.  She had minimal coronary Ca on coronary CTA with a Ca score of <1 but no CAD.  After having COVID 19 she had some CP and a CRP was normal at 6 and ESR normal at 5 and no pericardial effusion on Chest CT.  2D echo showed no effusion.  Her chest pain has completely resolved.  She is doing well with her CPAP device and thinks that she has gotten used to it.  She tolerates the full face mask and feels the pressure is adequate.  Since going on CPAP she feels rested in the am and has no significant daytime sleepiness.  She denies any significant mouth or nasal dryness or nasal congestion.  She does not think that he snores.    Past Medical History:  Diagnosis Date  . Anxiety    per notes from Dr Rolena Infante (orthopedics)  . Asthma   . Depression   . Diabetes mellitus without complication ([place])   . Dilated aortic root (HCC)    42m by CTA 07/2018  . Fatty liver   . H/O cluster headache   . High cholesterol   . Hypertension   . Morbid obesity with BMI of 40.0-44.9, adult ([place]   . OSA (obstructive sleep apnea)    mild with AHI 11/hr now on CPAP at 20cm H2O  . Swelling    legs/feet/hands    Past Surgical History:  Procedure Laterality Date  . ABDOMINAL HYSTERECTOMY   2006  . BACK SURGERY     l4-5  . BACK SURGERY  2006   x2  . CARPAL TUNNEL RELEASE     right  . [place] 2005  . KNEE SURGERY  06/15/1985  . SHOULDER SURGERY Left 2020 and 2021  . SHOULDER SURGERY Right 2007  . SPINAL CORD STIMULATOR IMPLANT  2012   battery replacement 2017 and 2021    Current Medications: Current Meds  Medication Sig  . Albuterol Sulfate (PROAIR RESPICLICK) 1387(90 Base) MCG/ACT AEPB INHALE 1 PUFF BY MOUTH EVERY 4 HOURS AS NEEDED  . amLODipine (NORVASC) 10 MG tablet Take 10 mg by mouth every morning.   .Marland Kitchenaspirin EC 81 MG tablet Take 1 tablet (81 mg total) by mouth daily.  .Marland Kitchenatorvastatin (LIPITOR) 10 MG tablet Take 1 tablet (10 mg total) by mouth daily.  . Calcium Carbonate-Vitamin D (CALCIUM 600 + D PO) Take 2 tablets by mouth every morning.   . carboxymethylcellulose (REFRESH PLUS) 0.5 % SOLN Place 1 drop into both eyes 3 (three) times daily as needed (dry eyes).  . colchicine 0.6 MG tablet Take  1 tablet (0.6 mg total) by mouth 2 (two) times daily.  . diclofenac (VOLTAREN) 75 MG EC tablet Take 75 mg by mouth in the morning and at bedtime.  . Dulaglutide (TRULICITY) 1.5 JK/0.9FG SOPN Inject 1.5 mg into the skin once a week.  . gabapentin (NEURONTIN) 300 MG capsule Take 300 mg by mouth 2 (two) times daily.  Marland Kitchen glimepiride (AMARYL) 2 MG tablet Take 1 tablet (2 mg total) by mouth daily before breakfast.  . glucose blood (ONETOUCH VERIO) test strip Use as instructed to test blood sugar 2 times daily E11.65  . HYDROcodone-acetaminophen (NORCO/VICODIN) 5-325 MG tablet Take 1 tablet by mouth every 6 (six) hours as needed for moderate pain.  . metFORMIN (GLUCOPHAGE-XR) 500 MG 24 hr tablet Take 2,000 mg by mouth daily with supper.  . metoprolol (TOPROL-XL) 200 MG 24 hr tablet Take 200 mg by mouth every evening.   Marland Kitchen tiZANidine (ZANAFLEX) 4 MG tablet Take 1 tablet (4 mg total) by mouth every 8 (eight) hours as needed for muscle spasms. DO NOT DRINK ALCOHOL OR  DRIVE WHILE TAKING THIS MEDICATION  . triamcinolone cream (KENALOG) 0.1 % as needed (flare ups).     Allergies:   Patient has no known allergies.   Social History   Socioeconomic History  . Marital status: Married    Spouse name: Not on file  . Number of children: 2  . Years of education: Not on file  . Highest education level: Some college, no degree  Occupational History  . Occupation: disability  Tobacco Use  . Smoking status: Former Smoker    Packs/day: 1.00    Years: 10.00    Pack years: 10.00    Quit date: 2004    Years since quitting: 18.0  . Smokeless tobacco: Never Used  Vaping Use  . Vaping Use: Never used  Substance and Sexual Activity  . Alcohol use: Yes    Comment: socially   . Drug use: No  . Sexual activity: Not on file  Other Topics Concern  . Not on file  Social History Narrative   Caffeine: occasional    Right handed   Lives at home with husband, father, and two sons   Social Determinants of Health   Financial Resource Strain: Not on file  Food Insecurity: Not on file  Transportation Needs: Not on file  Physical Activity: Not on file  Stress: Not on file  Social Connections: Not on file     Family History: The patient's family history includes Arthritis in her mother; Breast cancer in her mother and paternal grandmother; Diabetes in her brother, father, mother, and sister; Heart disease in her father and paternal grandfather; Hypertension in her brother, father, maternal aunt, maternal grandfather, maternal grandmother, maternal uncle, mother, paternal aunt, paternal grandfather, paternal grandmother, paternal uncle, and sister; Lymphoma in her mother; Prostate cancer in her father; Stomach cancer in her maternal grandmother.  ROS:   Please see the history of present illness.    ROS  All other systems reviewed and negative.   EKGs/Labs/Other Studies Reviewed:    The following studies were reviewed today:  PAP compliance download  EKG:   EKG is not ordered today.    2D echo 05/2020 IMPRESSIONS   1. Left ventricular ejection fraction, by estimation, is 55 to 60%. The  left ventricle has normal function. The left ventricle has no regional  wall motion abnormalities. The left ventricular internal cavity size was  mildly dilated. Left ventricular  diastolic parameters are  consistent with Grade I diastolic dysfunction  (impaired relaxation).  2. Right ventricular systolic function is normal. The right ventricular  size is normal.  3. Left atrial size was mildly dilated.  4. The mitral valve is normal in structure. Trivial mitral valve  regurgitation. No evidence of mitral stenosis.  5. The aortic valve is tricuspid. Aortic valve regurgitation is not  visualized. No aortic stenosis is present.  6. The inferior vena cava is normal in size with greater than 50%  respiratory variability, suggesting right atrial pressure of 3 mmHg.   Recent Labs: 05/21/2020: ALT 32; BUN 8; Creatinine, Ser 0.68; Hemoglobin 13.4; Platelets 278; Potassium 4.1; Sodium 143; TSH 0.901   Recent Lipid Panel No results found for: CHOL, TRIG, HDL, CHOLHDL, VLDL, LDLCALC, LDLDIRECT  Physical Exam:    VS:  BP (!) 146/88   Pulse 70   Ht 5' 6"  (1.676 m)   Wt 250 lb 12.8 oz (113.8 kg)   SpO2 96%   BMI 40.48 kg/m     Wt Readings from Last 3 Encounters:  06/26/20 250 lb 12.8 oz (113.8 kg)  05/29/20 245 lb (111.1 kg)  05/21/20 254 lb (115.2 kg)    GEN: Well nourished, well developed in no acute distress HEENT: Normal NECK: No JVD; No carotid bruits LYMPHATICS: No lymphadenopathy CARDIAC:RRR, no murmurs, rubs, gallops RESPIRATORY:  Clear to auscultation without rales, wheezing or rhonchi  ABDOMEN: Soft, non-tender, non-distended MUSCULOSKELETAL:  No edema; No deformity  SKIN: Warm and dry NEUROLOGIC:  Alert and oriented x 3 PSYCHIATRIC:  Normal affect    ASSESSMENT:    1. OSA (obstructive sleep apnea)   2. Primary hypertension   3.  Morbid obesity with BMI of 40.0-44.9, adult ([place])   4. Dilated aortic root (HCC)   5. COVID   6. Chest pain of uncertain etiology    PLAN:    In order of problems listed above:  1. OSA - The patient is tolerating PAP therapy well without any problems. The PAP download was reviewed today and showed an AHI of 1.6/hr on 18 cm H2O with 70% compliance in using more than 4 hours nightly.  The patient has been using and benefiting from PAP use and will continue to benefit from therapy.   2.  HTN -BP is borderline controlled on exam -continue Toprol XL 270m daily and amlodipine 140mdaily  3.  Morbid Obesity -I have encouraged her to get into a routine exercise program and cut back on carbs and portions.   4.  Dilated aortic root -noted on coronary CTA at 38110mut 2D echoes have all been normal  5.  COVID 19  -she had infection late Sept 2021 -Chest CT in Dec showed a mosaic pattern>>I have asked her to make an appt with her PCP for further evaulation  6.  Chest pain -this occurred after COVID 19 -2D echo with normal LVF and no pericardial effusion -inflammatory markers were normal -chest CT with mosaic pattern in lungs but no pericardial effusion -CP has completely resolved  Medication Adjustments/Labs and Tests Ordered: Current medicines are reviewed at length with the patient today.  Concerns regarding medicines are outlined above.  No orders of the defined types were placed in this encounter.  No orders of the defined types were placed in this encounter.  Followup with me in 1 year  Signed, TraFransico HimD  06/26/2020 11:13 AM    [place]

## 2020-06-26 ENCOUNTER — Ambulatory Visit (INDEPENDENT_AMBULATORY_CARE_PROVIDER_SITE_OTHER): Payer: 59 | Admitting: Cardiology

## 2020-06-26 ENCOUNTER — Other Ambulatory Visit: Payer: Self-pay

## 2020-06-26 ENCOUNTER — Encounter: Payer: Self-pay | Admitting: Cardiology

## 2020-06-26 VITALS — BP 146/88 | HR 70 | Ht 66.0 in | Wt 250.8 lb

## 2020-06-26 DIAGNOSIS — I10 Essential (primary) hypertension: Secondary | ICD-10-CM

## 2020-06-26 DIAGNOSIS — Z6841 Body Mass Index (BMI) 40.0 and over, adult: Secondary | ICD-10-CM | POA: Diagnosis not present

## 2020-06-26 DIAGNOSIS — G4733 Obstructive sleep apnea (adult) (pediatric): Secondary | ICD-10-CM

## 2020-06-26 DIAGNOSIS — I77810 Thoracic aortic ectasia: Secondary | ICD-10-CM

## 2020-06-26 DIAGNOSIS — U071 COVID-19: Secondary | ICD-10-CM | POA: Diagnosis not present

## 2020-06-26 DIAGNOSIS — R079 Chest pain, unspecified: Secondary | ICD-10-CM | POA: Diagnosis not present

## 2020-06-26 NOTE — Patient Instructions (Signed)

## 2020-07-03 DIAGNOSIS — M545 Low back pain, unspecified: Secondary | ICD-10-CM | POA: Diagnosis not present

## 2020-07-03 DIAGNOSIS — R202 Paresthesia of skin: Secondary | ICD-10-CM | POA: Diagnosis not present

## 2020-07-03 DIAGNOSIS — M5459 Other low back pain: Secondary | ICD-10-CM | POA: Diagnosis not present

## 2020-07-03 DIAGNOSIS — M79605 Pain in left leg: Secondary | ICD-10-CM | POA: Diagnosis not present

## 2020-07-06 ENCOUNTER — Encounter (INDEPENDENT_AMBULATORY_CARE_PROVIDER_SITE_OTHER): Payer: Self-pay

## 2020-07-13 ENCOUNTER — Other Ambulatory Visit: Payer: Medicare Other

## 2020-07-25 DIAGNOSIS — R202 Paresthesia of skin: Secondary | ICD-10-CM | POA: Diagnosis not present

## 2020-07-25 DIAGNOSIS — M79605 Pain in left leg: Secondary | ICD-10-CM | POA: Diagnosis not present

## 2020-07-25 DIAGNOSIS — M545 Low back pain, unspecified: Secondary | ICD-10-CM | POA: Diagnosis not present

## 2020-07-25 DIAGNOSIS — M5459 Other low back pain: Secondary | ICD-10-CM | POA: Diagnosis not present

## 2020-07-29 ENCOUNTER — Ambulatory Visit: Payer: Medicare Other | Attending: Neurology | Admitting: Speech Pathology

## 2020-07-30 DIAGNOSIS — M79605 Pain in left leg: Secondary | ICD-10-CM | POA: Diagnosis not present

## 2020-07-30 DIAGNOSIS — M5459 Other low back pain: Secondary | ICD-10-CM | POA: Diagnosis not present

## 2020-07-30 DIAGNOSIS — M545 Low back pain, unspecified: Secondary | ICD-10-CM | POA: Diagnosis not present

## 2020-07-30 DIAGNOSIS — R202 Paresthesia of skin: Secondary | ICD-10-CM | POA: Diagnosis not present

## 2020-08-01 DIAGNOSIS — M545 Low back pain, unspecified: Secondary | ICD-10-CM | POA: Diagnosis not present

## 2020-08-01 DIAGNOSIS — R202 Paresthesia of skin: Secondary | ICD-10-CM | POA: Diagnosis not present

## 2020-08-01 DIAGNOSIS — M5459 Other low back pain: Secondary | ICD-10-CM | POA: Diagnosis not present

## 2020-08-01 DIAGNOSIS — M79605 Pain in left leg: Secondary | ICD-10-CM | POA: Diagnosis not present

## 2020-08-03 ENCOUNTER — Other Ambulatory Visit: Payer: Medicare Other

## 2020-08-19 DIAGNOSIS — M778 Other enthesopathies, not elsewhere classified: Secondary | ICD-10-CM | POA: Diagnosis not present

## 2020-08-30 DIAGNOSIS — R1013 Epigastric pain: Secondary | ICD-10-CM | POA: Diagnosis not present

## 2020-09-02 ENCOUNTER — Telehealth: Payer: Self-pay | Admitting: Neurology

## 2020-09-02 DIAGNOSIS — R1013 Epigastric pain: Secondary | ICD-10-CM | POA: Diagnosis not present

## 2020-09-02 NOTE — Telephone Encounter (Signed)
[place] auth: [id number] & [id number] (exp. 09/02/20 to 10/17/20)   Patient is scheduled at GI for 09/03/20.

## 2020-09-03 ENCOUNTER — Other Ambulatory Visit (HOSPITAL_BASED_OUTPATIENT_CLINIC_OR_DEPARTMENT_OTHER): Payer: Self-pay | Admitting: Family Medicine

## 2020-09-03 ENCOUNTER — Telehealth: Payer: Self-pay | Admitting: Neurology

## 2020-09-03 ENCOUNTER — Ambulatory Visit
Admission: RE | Admit: 2020-09-03 | Discharge: 2020-09-03 | Disposition: A | Discharge status: Home / Self Care | Payer: Managed Care, Other (non HMO) | Source: Ambulatory Visit | Attending: Neurology | Admitting: Neurology

## 2020-09-03 ENCOUNTER — Ambulatory Visit
Admission: RE | Admit: 2020-09-03 | Discharge: 2020-09-03 | Disposition: A | Discharge status: Home / Self Care | Payer: Medicare Other | Source: Ambulatory Visit | Attending: Neurology | Admitting: Neurology

## 2020-09-03 ENCOUNTER — Other Ambulatory Visit (HOSPITAL_COMMUNITY): Payer: Self-pay | Admitting: Family Medicine

## 2020-09-03 ENCOUNTER — Other Ambulatory Visit: Payer: Self-pay

## 2020-09-03 DIAGNOSIS — G4484 Primary exertional headache: Secondary | ICD-10-CM

## 2020-09-03 DIAGNOSIS — R4189 Other symptoms and signs involving cognitive functions and awareness: Secondary | ICD-10-CM

## 2020-09-03 DIAGNOSIS — Z87820 Personal history of traumatic brain injury: Secondary | ICD-10-CM

## 2020-09-03 DIAGNOSIS — S0990XA Unspecified injury of head, initial encounter: Secondary | ICD-10-CM

## 2020-09-03 DIAGNOSIS — F0781 Postconcussional syndrome: Secondary | ICD-10-CM

## 2020-09-03 DIAGNOSIS — H539 Unspecified visual disturbance: Secondary | ICD-10-CM

## 2020-09-03 DIAGNOSIS — R1013 Epigastric pain: Secondary | ICD-10-CM

## 2020-09-03 NOTE — Addendum Note (Signed)
Addended by: Sarina Ill B on: 09/03/2020 03:41 PM   Modules accepted: Orders

## 2020-09-03 NOTE — Telephone Encounter (Signed)
I ordered a CTA of the head instead thanks

## 2020-09-03 NOTE — Telephone Encounter (Signed)
I called the [name] and LVM (ok per DPR) advising that Dr Jaynee Eagles ordered a CT-A instead. Left office number in case [name] has further questions and advised she would be receiving a separate call to schedule the CT upon completion of insurance authorization.

## 2020-09-03 NOTE — Telephone Encounter (Signed)
Pt. Came in today needing to speak with RN. Pt had an appt to have MRI done today but was unable due to having a spinal cord stimulator and was told she could to CT scan instead. Best call back is [phone]

## 2020-09-04 ENCOUNTER — Ambulatory Visit (HOSPITAL_BASED_OUTPATIENT_CLINIC_OR_DEPARTMENT_OTHER): Payer: Medicare Other

## 2020-09-04 NOTE — Telephone Encounter (Signed)
Medicare/[employer] Josem Kaufmann: [id number] (exp. 09/04/20 to 10/19/20) order sent to GI. They will reach out to the patient to schedule.

## 2020-09-05 ENCOUNTER — Ambulatory Visit: Payer: 59 | Admitting: Speech Pathology

## 2020-09-05 ENCOUNTER — Other Ambulatory Visit: Payer: Self-pay

## 2020-09-05 ENCOUNTER — Encounter: Payer: Self-pay | Admitting: Speech Pathology

## 2020-09-05 ENCOUNTER — Ambulatory Visit (HOSPITAL_BASED_OUTPATIENT_CLINIC_OR_DEPARTMENT_OTHER)
Admission: RE | Admit: 2020-09-05 | Discharge: 2020-09-05 | Disposition: A | Discharge status: Home / Self Care | Payer: 59 | Source: Ambulatory Visit | Attending: Family Medicine | Admitting: Family Medicine

## 2020-09-05 ENCOUNTER — Ambulatory Visit: Payer: 59 | Attending: Neurology | Admitting: Occupational Therapy

## 2020-09-05 DIAGNOSIS — R1013 Epigastric pain: Secondary | ICD-10-CM

## 2020-09-05 DIAGNOSIS — R4701 Aphasia: Secondary | ICD-10-CM | POA: Diagnosis not present

## 2020-09-05 DIAGNOSIS — K824 Cholesterolosis of gallbladder: Secondary | ICD-10-CM | POA: Insufficient documentation

## 2020-09-05 DIAGNOSIS — R41840 Attention and concentration deficit: Secondary | ICD-10-CM | POA: Diagnosis not present

## 2020-09-05 DIAGNOSIS — R41841 Cognitive communication deficit: Secondary | ICD-10-CM | POA: Insufficient documentation

## 2020-09-05 DIAGNOSIS — Z9181 History of falling: Secondary | ICD-10-CM | POA: Insufficient documentation

## 2020-09-05 DIAGNOSIS — R41842 Visuospatial deficit: Secondary | ICD-10-CM | POA: Insufficient documentation

## 2020-09-05 DIAGNOSIS — R29818 Other symptoms and signs involving the nervous system: Secondary | ICD-10-CM | POA: Insufficient documentation
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
## 2020-09-09 ENCOUNTER — Encounter: Payer: Self-pay | Admitting: Speech Pathology

## 2020-09-09 ENCOUNTER — Other Ambulatory Visit: Payer: Self-pay

## 2020-09-09 ENCOUNTER — Ambulatory Visit: Payer: 59 | Admitting: Speech Pathology

## 2020-09-09 DIAGNOSIS — R41841 Cognitive communication deficit: Secondary | ICD-10-CM | POA: Diagnosis not present

## 2020-09-09 DIAGNOSIS — R4701 Aphasia: Secondary | ICD-10-CM | POA: Diagnosis not present

## 2020-09-09 DIAGNOSIS — Z9181 History of falling: Secondary | ICD-10-CM | POA: Diagnosis not present

## 2020-09-09 DIAGNOSIS — R41842 Visuospatial deficit: Secondary | ICD-10-CM | POA: Diagnosis not present

## 2020-09-09 DIAGNOSIS — R41840 Attention and concentration deficit: Secondary | ICD-10-CM | POA: Diagnosis not present

## 2020-09-09 DIAGNOSIS — R29818 Other symptoms and signs involving the nervous system: Secondary | ICD-10-CM | POA: Diagnosis not present
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
## 2020-09-11 ENCOUNTER — Ambulatory Visit
Admission: RE | Admit: 2020-09-11 | Discharge: 2020-09-11 | Disposition: A | Discharge status: Home / Self Care | Payer: Medicare Other | Source: Ambulatory Visit | Attending: Neurology | Admitting: Neurology

## 2020-09-11 ENCOUNTER — Other Ambulatory Visit: Payer: Self-pay

## 2020-09-11 ENCOUNTER — Ambulatory Visit: Payer: 59 | Admitting: Occupational Therapy

## 2020-09-11 ENCOUNTER — Ambulatory Visit: Payer: 59 | Admitting: Speech Pathology

## 2020-09-11 DIAGNOSIS — H539 Unspecified visual disturbance: Secondary | ICD-10-CM

## 2020-09-11 DIAGNOSIS — F0781 Postconcussional syndrome: Secondary | ICD-10-CM

## 2020-09-11 DIAGNOSIS — G4484 Primary exertional headache: Secondary | ICD-10-CM

## 2020-09-11 DIAGNOSIS — R5383 Other fatigue: Secondary | ICD-10-CM

## 2020-09-11 DIAGNOSIS — R413 Other amnesia: Secondary | ICD-10-CM | POA: Diagnosis not present

## 2020-09-11 DIAGNOSIS — E538 Deficiency of other specified B group vitamins: Secondary | ICD-10-CM

## 2020-09-11 DIAGNOSIS — R4189 Other symptoms and signs involving cognitive functions and awareness: Secondary | ICD-10-CM | POA: Diagnosis not present

## 2020-09-11 DIAGNOSIS — S0990XA Unspecified injury of head, initial encounter: Secondary | ICD-10-CM | POA: Diagnosis not present

## 2020-09-11 DIAGNOSIS — R4701 Aphasia: Secondary | ICD-10-CM

## 2020-09-11 DIAGNOSIS — Z87820 Personal history of traumatic brain injury: Secondary | ICD-10-CM

## 2020-09-11 MED ORDER — IOPAMIDOL (ISOVUE-370) INJECTION 76%
75.0000 mL | Freq: Once | INTRAVENOUS | Status: AC | PRN
  Administered 2020-09-11: 75 mL via INTRAVENOUS

## 2020-09-12 ENCOUNTER — Other Ambulatory Visit: Payer: Self-pay | Admitting: Family Medicine

## 2020-09-12 DIAGNOSIS — R160 Hepatomegaly, not elsewhere classified: Secondary | ICD-10-CM

## 2020-09-16 ENCOUNTER — Telehealth: Payer: Self-pay | Admitting: *Deleted

## 2020-09-16 NOTE — Telephone Encounter (Signed)
-----   Message from Melvenia Beam, MD sent at 09/12/2020  4:42 PM EDT ----- CT of the head and CTA were normal for age. 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 I have never heard of Eagle Syndrome, it is recurrent pain in the middle part of the throat (oropharynx) and face. If she has this then I would ask her to see her primary care physician for further recommendations thanks.

## 2020-09-16 NOTE — Telephone Encounter (Signed)
Spoke with patient and discussed results of CT head and CTA.  Patient had never heard of Eagle syndrome either but she verbalized understanding of the results and appreciation for the call.  She will call sooner if needed otherwise follow-up in June.

## 2020-09-17 ENCOUNTER — Ambulatory Visit: Payer: 59 | Attending: Neurology | Admitting: Speech Pathology

## 2020-09-17 ENCOUNTER — Other Ambulatory Visit: Payer: Self-pay

## 2020-09-17 ENCOUNTER — Encounter: Payer: Self-pay | Admitting: Speech Pathology

## 2020-09-17 ENCOUNTER — Ambulatory Visit: Payer: 59 | Admitting: Occupational Therapy

## 2020-09-17 DIAGNOSIS — Z9181 History of falling: Secondary | ICD-10-CM | POA: Diagnosis not present

## 2020-09-17 DIAGNOSIS — R4701 Aphasia: Secondary | ICD-10-CM | POA: Diagnosis not present

## 2020-09-17 DIAGNOSIS — R41840 Attention and concentration deficit: Secondary | ICD-10-CM | POA: Diagnosis not present

## 2020-09-17 DIAGNOSIS — R41842 Visuospatial deficit: Secondary | ICD-10-CM

## 2020-09-17 DIAGNOSIS — R29818 Other symptoms and signs involving the nervous system: Secondary | ICD-10-CM

## 2020-09-17 DIAGNOSIS — R41841 Cognitive communication deficit: Secondary | ICD-10-CM

## 2020-09-17 NOTE — Patient Instructions (Signed)
Use Expanding Expression Tool at home for words you are having difficulty finding.

## 2020-09-17 NOTE — Therapy (Signed)
[place]. [place], [place], [address] Phone: [phone]   Fax:  [phone]  Occupational Therapy Treatment  Patient Details  Name: [name] MRN: [id number] Date of Birth: [birth year] Referring Provider (OT): Sarina Ill, MD   Encounter Date: 09/17/2020   OT End of Session - 09/17/20 1349    Visit Number 2    Number of Visits 5    Date for OT Re-Evaluation 10/31/20    Authorization Type [employer] (primary) / Medicare (secondary)    Progress Note Due on Visit 10    OT Start Time 1021   Pt arrived late   OT Stop Time 1055   Session ended early due to pt's discomfort after visual activities   OT Time Calculation (min) 34 min    Activity Tolerance Patient tolerated treatment well    Behavior During Therapy [facility] for tasks assessed/performed           Past Medical History:  Diagnosis Date  . Anxiety    per notes from Dr Rolena Infante (orthopedics)  . Asthma   . Depression   . Diabetes mellitus without complication ([place])   . Dilated aortic root (HCC)    51mm by CTA 07/2018  . Fatty liver   . H/O cluster headache   . High cholesterol   . Hypertension   . Morbid obesity with BMI of 40.0-44.9, adult ([place])   . OSA (obstructive sleep apnea)    mild with AHI 11/hr now on CPAP at 20cm H2O  . Swelling    legs/feet/hands    Past Surgical History:  Procedure Laterality Date  . ABDOMINAL HYSTERECTOMY  2006  . BACK SURGERY     l4-5  . BACK SURGERY  2006   x2  . CARPAL TUNNEL RELEASE     right  . [place] and 2005  . KNEE SURGERY  06/15/1985  . SHOULDER SURGERY Left 2020 and 2021  . SHOULDER SURGERY Right 2007  . SPINAL CORD STIMULATOR IMPLANT  2012   battery replacement 2017 and 2021    There were no vitals filed for this visit.   Subjective Assessment - 09/17/20 1026    Subjective  "My eyes are hurting a little"    Patient is accompanied by: Family member   Jay    Pertinent History asthma, diabetes w/ peripheral neuropathy, anxiety    Patient Stated Goals "Improve my communication so that conversations are easier for me"    Currently in Pain? No/denies            OT Treatments/Exercises (OP) - 09/17/20 0930      ADLs   Compensatory Strategies OT provided visual compensatory strategies, including using a line guide, incorporating high contrast (e.g. during cooking tasks) when able, and turning her head slowly while maintaining alignment w/ her eyes      Visual/Perceptual Exercises   Other Exercises Pt completed 7M number cancellation activity, finding 7/11 targets. OT graded activity down to 45M number cancellation, w/ pt finding 6/7 targets; pt repositioned paper to arms-length in order to get targets within focus during both trials despite wearing rx bifocals. OT then provided line guide for pt to complete 2nd half of 45M number cancellation and pt was able to find 9/9 w/out difficulty            OT Education - 09/17/20 1326    Education Details Education provided on visual compensatory strategies as well as the importance  of following up w/ optometrist/ophthalmologist regarding visual deficits/vestibular symptoms.    Person(s) Educated Patient    Methods Explanation    Comprehension Verbalized understanding            OT Short Term Goals - 09/17/20 1031      OT SHORT TERM GOAL #1   Title Pt will demonstrate understanding of visual compensatory strategies 100% of the time to improve safety during functional/community mobility    Baseline No visual compensatory strategies at this time    Time 2    Period Weeks    Status On-going    Target Date 09/19/20            OT Long Term Goals - 09/17/20 1030      OT LONG TERM GOAL #1   Title Pt will be independent with HEP designed for balance and stability during functional mobility    Baseline Pt reports occasional dizziness and difficulty w/ depth perception    Time 4    Period Weeks     Status On-going      OT LONG TERM GOAL #2   Title Pt will be independent w/ memory strategies, including AE prn, to improve participation in grocery shopping    Baseline Reports forgetfulness/buying repeat items when shopping    Time 4    Period Weeks    Status On-going            Plan - 09/17/20 1031    Clinical Impression Statement Pt is a 55 y.o. female seen in OP OT secondary to TBI/post-concussive symptoms. Due to observed difficulties w/ tabletop visual scanning activity and discomfort experienced w/ smooth pursuits and vestibular-ocular screening, OT recommended pt follow-up w/ optometrist/ophthalmologist prior to returning to OT. OT to defer pt until she is able to follow-up w/ optometrist/ophthalmologist to determine continued necessity of skilled OT services. Pt encouraged to call back with any changes or if any relevant functional deficits develop/occur. OT also provided pt w/ compensatory strategies this session to assist w/ management of symptoms in the meantime.    OT Occupational Profile and History Detailed Assessment- Review of Records and additional review of physical, cognitive, psychosocial history related to current functional performance    Occupational performance deficits (Please refer to evaluation for details): ADL's;IADL's;Leisure;Social Participation    Body Structure / Function / Physical Skills ADL;Balance;Decreased knowledge of use of DME;Mobility;Vision;Coordination;IADL;Proprioception;Vestibular    Cognitive Skills Attention;Memory;Perception;Problem Solve;Safety Awareness    Rehab Potential Fair    Clinical Decision Making Several treatment options, min-mod task modification necessary    Comorbidities Affecting Occupational Performance: Presence of comorbidities impacting occupational performance    Modification or Assistance to Complete Evaluation  Min-Moderate modification of tasks or assist with assess necessary to complete eval    OT Frequency 1x / week     OT Duration 4 weeks    OT Treatment/Interventions Therapeutic exercise;Visual/perceptual remediation/compensation;Patient/family education;Balance training;Therapeutic activities;Functional Mobility Training;DME and/or AE instruction;Cognitive remediation/compensation;Self-care/ADL training    Plan Put pt on hold until after follow-up w/ optometrist. Will d/c at that time if no further therapy is warranted.    Consulted and Agree with Plan of Care Patient;Family member/caregiver           Patient will benefit from skilled therapeutic intervention in order to improve the following deficits and impairments:   Body Structure / Function / Physical Skills: ADL,Balance,Decreased knowledge of use of DME,Mobility,Vision,Coordination,IADL,Proprioception,Vestibular Cognitive Skills: Attention,Memory,Perception,Problem Solve,Safety Awareness     Visit Diagnosis: Visuospatial deficit  Other symptoms and signs involving  the nervous system  Attention and concentration deficit  History of falling    Problem List Patient Active Problem List   Diagnosis Date Noted  . Post concussive syndrome 05/21/2020  . Hx of multiple concussions 05/21/2020  . Dyslipidemia 03/28/2020  . Type 2 diabetes mellitus with hyperglycemia, without long-term current use of insulin ([place]) 08/16/2019  . Type 2 diabetes mellitus with diabetic polyneuropathy, without long-term current use of insulin ([place]) 08/16/2019  . Dilated aortic root ([place])   . Plantar fasciitis 06/01/2018  . Hypokalemia 02/17/2016  . Chest pain 02/16/2016  . Morbid obesity with BMI of 40.0-44.9, adult ([place]) 05/15/2013  . Fatty liver   . OSA (obstructive sleep apnea)   . Hypertension   . Diabetes mellitus without complication ([place])   . Asthma      Kathrine Cords, OTR/L, MSOT 09/17/2020, 6:01 PM  [place]. [place], [place], [address] Phone: [phone]   Fax:   [phone]  Name: [name] MRN: [id number] Date of Birth: 1966/05/26
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
## 2020-09-19 ENCOUNTER — Encounter: Payer: Medicare Other | Admitting: Psychology

## 2020-09-20 DIAGNOSIS — M25512 Pain in left shoulder: Secondary | ICD-10-CM | POA: Diagnosis not present

## 2020-09-20 DIAGNOSIS — M6281 Muscle weakness (generalized): Secondary | ICD-10-CM | POA: Diagnosis not present

## 2020-09-24 ENCOUNTER — Ambulatory Visit: Payer: 59 | Admitting: Occupational Therapy

## 2020-09-24 ENCOUNTER — Ambulatory Visit: Payer: 59 | Admitting: Speech Pathology

## 2020-09-26 ENCOUNTER — Other Ambulatory Visit: Payer: Self-pay

## 2020-09-26 ENCOUNTER — Ambulatory Visit: Payer: 59 | Admitting: Speech Pathology

## 2020-09-26 ENCOUNTER — Encounter: Payer: Self-pay | Admitting: Internal Medicine

## 2020-09-26 ENCOUNTER — Ambulatory Visit (INDEPENDENT_AMBULATORY_CARE_PROVIDER_SITE_OTHER): Payer: 59 | Admitting: Internal Medicine

## 2020-09-26 ENCOUNTER — Ambulatory Visit: Payer: 59 | Admitting: Occupational Therapy

## 2020-09-26 VITALS — BP 136/82 | HR 81 | Ht 66.0 in | Wt 243.0 lb

## 2020-09-26 DIAGNOSIS — E1142 Type 2 diabetes mellitus with diabetic polyneuropathy: Secondary | ICD-10-CM | POA: Diagnosis not present

## 2020-09-26 DIAGNOSIS — E1165 Type 2 diabetes mellitus with hyperglycemia: Secondary | ICD-10-CM

## 2020-09-26 DIAGNOSIS — M6281 Muscle weakness (generalized): Secondary | ICD-10-CM | POA: Diagnosis not present

## 2020-09-26 DIAGNOSIS — E785 Hyperlipidemia, unspecified: Secondary | ICD-10-CM | POA: Diagnosis not present

## 2020-09-26 DIAGNOSIS — M25512 Pain in left shoulder: Secondary | ICD-10-CM | POA: Diagnosis not present

## 2020-09-26 LAB — POCT GLYCOSYLATED HEMOGLOBIN (HGB A1C): Hemoglobin A1C: 6.3 % — AB (ref 4.0–5.6)

## 2020-09-26 LAB — POCT GLUCOSE (DEVICE FOR HOME USE): POC Glucose: 132 mg/dl — AB (ref 70–99)
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
## 2020-09-26 NOTE — Patient Instructions (Addendum)
-   Keep up the Good Work ! - Continue  Trulicity  1.5 mg weekly  - Decrease Glimepiride to 2 mg , 1 tablet daily  - Continue Metformin 1000 mg Twice daily  - Continue Lipitor 10 mg daily at night        HOW TO TREAT LOW BLOOD SUGARS (Blood sugar LESS THAN 70 MG/DL)  Please follow the RULE OF 15 for the treatment of hypoglycemia treatment (when your (blood sugars are less than 70 mg/dL)    STEP 1: Take 15 grams of carbohydrates when your blood sugar is low, which includes:   3-4 GLUCOSE TABS  OR  3-4 OZ OF JUICE OR REGULAR SODA OR  ONE TUBE OF GLUCOSE GEL     STEP 2: RECHECK blood sugar in 15 MINUTES STEP 3: If your blood sugar is still low at the 15 minute recheck --> then, go back to STEP 1 and treat AGAIN with another 15 grams of carbohydrates.

## 2020-10-02 ENCOUNTER — Other Ambulatory Visit: Payer: Self-pay

## 2020-10-02 ENCOUNTER — Encounter: Payer: 59 | Attending: Psychology | Admitting: Psychology

## 2020-10-02 DIAGNOSIS — Z87820 Personal history of traumatic brain injury: Secondary | ICD-10-CM | POA: Diagnosis not present

## 2020-10-02 DIAGNOSIS — R413 Other amnesia: Secondary | ICD-10-CM | POA: Insufficient documentation

## 2020-10-02 DIAGNOSIS — R519 Headache, unspecified: Secondary | ICD-10-CM | POA: Insufficient documentation

## 2020-10-02 DIAGNOSIS — R41840 Attention and concentration deficit: Secondary | ICD-10-CM | POA: Insufficient documentation

## 2020-10-02 DIAGNOSIS — R4701 Aphasia: Secondary | ICD-10-CM | POA: Insufficient documentation

## 2020-10-02 DIAGNOSIS — M25512 Pain in left shoulder: Secondary | ICD-10-CM | POA: Diagnosis not present

## 2020-10-02 DIAGNOSIS — M6281 Muscle weakness (generalized): Secondary | ICD-10-CM | POA: Diagnosis not present

## 2020-10-02 DIAGNOSIS — F0781 Postconcussional syndrome: Secondary | ICD-10-CM

## 2020-10-02 DIAGNOSIS — F4312 Post-traumatic stress disorder, chronic: Secondary | ICD-10-CM | POA: Diagnosis not present

## 2020-10-03 ENCOUNTER — Encounter: Payer: 59 | Admitting: Psychology

## 2020-10-06 ENCOUNTER — Ambulatory Visit
Admission: RE | Admit: 2020-10-06 | Discharge: 2020-10-06 | Disposition: A | Discharge status: Home / Self Care | Payer: Medicare Other | Source: Ambulatory Visit | Attending: Family Medicine | Admitting: Family Medicine

## 2020-10-06 ENCOUNTER — Other Ambulatory Visit: Payer: Self-pay

## 2020-10-06 DIAGNOSIS — R160 Hepatomegaly, not elsewhere classified: Secondary | ICD-10-CM

## 2020-10-08 DIAGNOSIS — M25512 Pain in left shoulder: Secondary | ICD-10-CM | POA: Diagnosis not present

## 2020-10-08 DIAGNOSIS — M6281 Muscle weakness (generalized): Secondary | ICD-10-CM | POA: Diagnosis not present

## 2020-10-09 ENCOUNTER — Encounter: Payer: Self-pay | Admitting: Speech Pathology

## 2020-10-09 ENCOUNTER — Other Ambulatory Visit: Payer: Self-pay

## 2020-10-09 ENCOUNTER — Ambulatory Visit: Payer: 59 | Admitting: Speech Pathology

## 2020-10-09 ENCOUNTER — Ambulatory Visit: Payer: 59 | Admitting: Occupational Therapy

## 2020-10-09 DIAGNOSIS — R41840 Attention and concentration deficit: Secondary | ICD-10-CM | POA: Diagnosis not present

## 2020-10-09 DIAGNOSIS — M25512 Pain in left shoulder: Secondary | ICD-10-CM | POA: Diagnosis not present

## 2020-10-09 DIAGNOSIS — R4701 Aphasia: Secondary | ICD-10-CM | POA: Diagnosis not present

## 2020-10-09 DIAGNOSIS — M6281 Muscle weakness (generalized): Secondary | ICD-10-CM | POA: Diagnosis not present

## 2020-10-09 DIAGNOSIS — R41842 Visuospatial deficit: Secondary | ICD-10-CM | POA: Diagnosis not present

## 2020-10-09 DIAGNOSIS — R41841 Cognitive communication deficit: Secondary | ICD-10-CM | POA: Diagnosis not present

## 2020-10-09 DIAGNOSIS — Z9181 History of falling: Secondary | ICD-10-CM | POA: Diagnosis not present

## 2020-10-09 DIAGNOSIS — R29818 Other symptoms and signs involving the nervous system: Secondary | ICD-10-CM | POA: Diagnosis not present
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
## 2020-10-15 DIAGNOSIS — M2669 Other specified disorders of temporomandibular joint: Secondary | ICD-10-CM | POA: Diagnosis not present

## 2020-10-15 DIAGNOSIS — H9201 Otalgia, right ear: Secondary | ICD-10-CM | POA: Diagnosis not present

## 2020-10-22 ENCOUNTER — Encounter: Payer: Self-pay | Admitting: Psychology
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
## 2020-10-23 ENCOUNTER — Other Ambulatory Visit: Payer: Self-pay

## 2020-10-23 ENCOUNTER — Ambulatory Visit: Payer: 59 | Attending: Neurology | Admitting: Speech Pathology

## 2020-10-23 ENCOUNTER — Encounter: Payer: Self-pay | Admitting: Speech Pathology

## 2020-10-23 DIAGNOSIS — R41841 Cognitive communication deficit: Secondary | ICD-10-CM

## 2020-10-23 DIAGNOSIS — R4701 Aphasia: Secondary | ICD-10-CM | POA: Insufficient documentation

## 2020-10-23 NOTE — Therapy (Signed)
[place] [place], [place], [address] Phone: [phone]   Fax:  [phone]  Speech Language Pathology Treatment  Patient Details  Name: [name] MRN: [id number] Date of Birth: 11-24-1965 No data recorded  Encounter Date: 10/23/2020   End of Session - 10/23/20 1435    Visit Number 5    Number of Visits 17    Date for SLP Re-Evaluation 11/05/20    SLP Start Time 61    SLP Stop Time  1314    SLP Time Calculation (min) 44 min    Activity Tolerance Patient tolerated treatment well           Past Medical History:  Diagnosis Date  . Anxiety    per notes from Dr Rolena Infante (orthopedics)  . Asthma   . Depression   . Diabetes mellitus without complication ([place])   . Dilated aortic root (HCC)    66mm by CTA 07/2018  . Fatty liver   . H/O cluster headache   . High cholesterol   . Hypertension   . Morbid obesity with BMI of 40.0-44.9, adult ([place])   . OSA (obstructive sleep apnea)    mild with AHI 11/hr now on CPAP at 20cm H2O  . Swelling    legs/feet/hands    Past Surgical History:  Procedure Laterality Date  . ABDOMINAL HYSTERECTOMY  2006  . BACK SURGERY     l4-5  . BACK SURGERY  2006   x2  . CARPAL TUNNEL RELEASE     right  . [place] and 2005  . KNEE SURGERY  06/15/1985  . SHOULDER SURGERY Left 2020 and 2021  . SHOULDER SURGERY Right 2007  . SPINAL CORD STIMULATOR IMPLANT  2012   battery replacement 2017 and 2021    There were no vitals filed for this visit.          ADULT SLP TREATMENT - 10/23/20 1427      General Information   Behavior/Cognition Alert;Pleasant mood      Treatment Provided   Treatment provided Cognitive-Linquistic      Cognitive-Linquistic Treatment   Treatment focused on Cognition    Skilled Treatment Educated patient on how to dicuss deficits with family. Pt reports that she can't express what she wants to say and that she has  trouble processing when a lot of information is given to her. SLP collaborated with patient on "what" to share and "how" to share information with her loved ones. Went over communication strategies.      Assessment / Recommendations / Plan   Plan Continue with current plan of care      Progression Toward Goals   Progression toward goals Progressing toward goals              SLP Short Term Goals - 10/23/20 1439      SLP SHORT TERM GOAL #1   Title Pt will identify 5 semantic features of a provided stimulus given verbal and visual cues.    Time 2    Period Weeks    Status On-going      SLP SHORT TERM GOAL #2   Title Pt will recall 3 word finding strategies to assist with expression of thoughts.    Time 2    Status On-going      SLP SHORT TERM GOAL #3   Title Pt will recall 3 strategies to assist with working memory.    Time 2  Period Weeks    Status On-going            SLP Long Term Goals - 10/23/20 1440      SLP LONG TERM GOAL #1   Title Client will utilize compensatory strategies to communicate wants and needs effectively to different conversational partners and participate socially in functional living environment.    Time 6    Period Weeks    Status On-going      SLP LONG TERM GOAL #2   Title Patient will demonstrate use of information processing, organization, and reasoning during daily living activities to improve safety and awareness in functional living environment    Time 6    Period Weeks    Status On-going      SLP LONG TERM GOAL #3   Title Patient will demonstrate use of memory strategies to schedule activities, recall weekly events and items to maintain safety to participate socially in functional living environment    Time 6    Period Weeks    Status On-going            Plan - 10/23/20 1436    Clinical Impression Statement Pt was able to demonstrate undestanding of communication strategies. Pt to sit down with family to discuss areas they can  help her in at home. SLP rec skilled speech services to address mild cognitive-communication impairment to reduce frustration and increase participation in ADLs.    Speech Therapy Frequency 2x / week    Duration 8 weeks    Treatment/Interventions Cueing hierarchy;Functional tasks;Patient/family education;Environmental controls;Cognitive reorganization;Multimodal communcation approach;Language facilitation;Compensatory techniques;Internal/external aids;SLP instruction and feedback    Potential to Achieve Goals Good    Potential Considerations Ability to learn/carryover information    Consulted and Agree with Plan of Care Patient           Patient will benefit from skilled therapeutic intervention in order to improve the following deficits and impairments:   Cognitive communication deficit    Problem List Patient Active Problem List   Diagnosis Date Noted  . Post concussive syndrome 05/21/2020  . Hx of multiple concussions 05/21/2020  . Dyslipidemia 03/28/2020  . Type 2 diabetes mellitus with hyperglycemia, without long-term current use of insulin ([place]) 08/16/2019  . Type 2 diabetes mellitus with diabetic polyneuropathy, without long-term current use of insulin ([place]) 08/16/2019  . Dilated aortic root ([place])   . Plantar fasciitis 06/01/2018  . Hypokalemia 02/17/2016  . Chest pain 02/16/2016  . Morbid obesity with BMI of 40.0-44.9, adult ([place]) 05/15/2013  . Fatty liver   . OSA (obstructive sleep apnea)   . Hypertension   . Diabetes mellitus without complication ([place])   . Asthma     Verdene Lennert MS, [place], CBIS  10/23/2020, 2:41 PM  [address]. [place], [place], [address] Phone: [phone]   Fax:  [phone]   Name: [name] MRN: [id number] Date of Birth: 1965/11/18

## 2020-10-25 ENCOUNTER — Encounter: Payer: Self-pay | Admitting: Speech Pathology

## 2020-10-25 ENCOUNTER — Other Ambulatory Visit: Payer: Self-pay

## 2020-10-25 ENCOUNTER — Ambulatory Visit: Payer: 59 | Admitting: Speech Pathology

## 2020-10-25 DIAGNOSIS — R4701 Aphasia: Secondary | ICD-10-CM | POA: Diagnosis not present

## 2020-10-25 DIAGNOSIS — R41841 Cognitive communication deficit: Secondary | ICD-10-CM | POA: Diagnosis not present

## 2020-10-25 NOTE — Therapy (Signed)
[address] [place], [place], [address] Phone: [phone]   Fax:  [phone]  Speech Language Pathology Treatment  Patient Details  Name: [name] MRN: [id number] Date of Birth: August 15, 1965 No data recorded  Encounter Date: 10/25/2020   End of Session - 10/25/20 1141    Visit Number 6    Number of Visits 17    Date for SLP Re-Evaluation 11/05/20    SLP Start Time 45    SLP Stop Time  1141    SLP Time Calculation (min) 41 min    Activity Tolerance Patient tolerated treatment well           Past Medical History:  Diagnosis Date  . Anxiety    per notes from Dr Rolena Infante (orthopedics)  . Asthma   . Depression   . Diabetes mellitus without complication ([place])   . Dilated aortic root (HCC)    1mm by CTA 07/2018  . Fatty liver   . H/O cluster headache   . High cholesterol   . Hypertension   . Morbid obesity with BMI of 40.0-44.9, adult ([place])   . OSA (obstructive sleep apnea)    mild with AHI 11/hr now on CPAP at 20cm H2O  . Swelling    legs/feet/hands    Past Surgical History:  Procedure Laterality Date  . ABDOMINAL HYSTERECTOMY  2006  . BACK SURGERY     l4-5  . BACK SURGERY  2006   x2  . CARPAL TUNNEL RELEASE     right  . [place] and 2005  . KNEE SURGERY  06/15/1985  . SHOULDER SURGERY Left 2020 and 2021  . SHOULDER SURGERY Right 2007  . SPINAL CORD STIMULATOR IMPLANT  2012   battery replacement 2017 and 2021    There were no vitals filed for this visit.   Subjective Assessment - 10/25/20 1109    Subjective Pt was missed 8am appointment and showed up at 10:30. SLP was able to take patient at 11.    Currently in Pain? No/denies                 ADULT SLP TREATMENT - 10/25/20 1146      General Information   Behavior/Cognition Alert;Pleasant mood      Treatment Provided   Treatment provided Cognitive-Linquistic      Cognitive-Linquistic Treatment    Treatment focused on Cognition    Skilled Treatment Educated and demonstrated communication to repair communication breakdowns. Pt and husband were able to appropriately demonstrate communication. Problem solved together.      Assessment / Recommendations / Plan   Plan Continue with current plan of care      Progression Toward Goals   Progression toward goals Progressing toward goals              SLP Short Term Goals - 10/25/20 1145      SLP SHORT TERM GOAL #1   Title Pt will identify 5 semantic features of a provided stimulus given verbal and visual cues.    Time 2    Period Weeks    Status On-going      SLP SHORT TERM GOAL #2   Title Pt will recall 3 word finding strategies to assist with expression of thoughts.    Time 2    Status On-going      SLP SHORT TERM GOAL #3   Title Pt will recall 3 strategies to assist with working  memory.    Time 2    Period Weeks    Status On-going            SLP Long Term Goals - 10/25/20 1146      SLP LONG TERM GOAL #1   Title Client will utilize compensatory strategies to communicate wants and needs effectively to different conversational partners and participate socially in functional living environment.    Time 6    Period Weeks    Status On-going      SLP LONG TERM GOAL #2   Title Patient will demonstrate use of information processing, organization, and reasoning during daily living activities to improve safety and awareness in functional living environment    Time 6    Period Weeks    Status On-going      SLP LONG TERM GOAL #3   Title Patient will demonstrate use of memory strategies to schedule activities, recall weekly events and items to maintain safety to participate socially in functional living environment    Time 6    Period Weeks    Status On-going            Plan - 10/25/20 1144    Clinical Impression Statement Pt was able to demonstrate undestanding of communication strategies. Pt and husband educated  on communicative breakdowns. SLP rec skilled speech services to address mild cognitive-communication impairment to reduce frustration and increase participation in ADLs.    Speech Therapy Frequency 2x / week    Duration 8 weeks    Treatment/Interventions Cueing hierarchy;Functional tasks;Patient/family education;Environmental controls;Cognitive reorganization;Multimodal communcation approach;Language facilitation;Compensatory techniques;Internal/external aids;SLP instruction and feedback    Potential to Achieve Goals Good    Potential Considerations Ability to learn/carryover information    Consulted and Agree with Plan of Care Patient           Patient will benefit from skilled therapeutic intervention in order to improve the following deficits and impairments:   Cognitive communication deficit    Problem List Patient Active Problem List   Diagnosis Date Noted  . Post concussive syndrome 05/21/2020  . Hx of multiple concussions 05/21/2020  . Dyslipidemia 03/28/2020  . Type 2 diabetes mellitus with hyperglycemia, without long-term current use of insulin ([place]) 08/16/2019  . Type 2 diabetes mellitus with diabetic polyneuropathy, without long-term current use of insulin ([place]) 08/16/2019  . Dilated aortic root ([place])   . Plantar fasciitis 06/01/2018  . Hypokalemia 02/17/2016  . Chest pain 02/16/2016  . Morbid obesity with BMI of 40.0-44.9, adult ([place]) 05/15/2013  . Fatty liver   . OSA (obstructive sleep apnea)   . Hypertension   . Diabetes mellitus without complication ([place])   . Asthma     Verdene Lennert MS, [place], CBIS  10/25/2020, 11:48 AM  [address] [place], [place], [address] Phone: [phone]   Fax:  [phone]   Name: [name] MRN: [id number] Date of Birth: Aug 06, 1965

## 2020-10-29 ENCOUNTER — Encounter: Payer: Self-pay | Admitting: Speech Pathology

## 2020-10-29 ENCOUNTER — Ambulatory Visit: Payer: 59 | Admitting: Speech Pathology

## 2020-10-29 ENCOUNTER — Other Ambulatory Visit: Payer: Self-pay

## 2020-10-29 DIAGNOSIS — R41841 Cognitive communication deficit: Secondary | ICD-10-CM | POA: Diagnosis not present

## 2020-10-29 DIAGNOSIS — R4701 Aphasia: Secondary | ICD-10-CM

## 2020-10-29 NOTE — Therapy (Signed)
[facility]. [place], [place], [address] Phone: [phone]   Fax:  [phone]  Speech Language Pathology Treatment  Patient Details  Name: [name] MRN: [id number] Date of Birth: [birth year] No data recorded  Encounter Date: 10/29/2020   End of Session - 10/29/20 1043    Visit Number 7    Number of Visits 17    Date for SLP Re-Evaluation 11/05/20    SLP Start Time 1007    SLP Stop Time  1048    SLP Time Calculation (min) 41 min    Activity Tolerance Patient tolerated treatment well           Past Medical History:  Diagnosis Date  . Anxiety    per notes from Dr Rolena Infante (orthopedics)  . Asthma   . Depression   . Diabetes mellitus without complication ([facility])   . Dilated aortic root ([facility])    26mm by CTA 07/2018  . Fatty liver   . H/O cluster headache   . High cholesterol   . Hypertension   . Morbid obesity with BMI of 40.0-44.9, adult ([facility])   . OSA (obstructive sleep apnea)    mild with AHI 11/hr now on CPAP at 20cm H2O  . Swelling    legs/feet/hands    Past Surgical History:  Procedure Laterality Date  . ABDOMINAL HYSTERECTOMY  2006  . BACK SURGERY     l4-5  . BACK SURGERY  2006   x2  . CARPAL TUNNEL RELEASE     right  . [place] and 2005  . KNEE SURGERY  06/15/1985  . SHOULDER SURGERY Left 2020 and 2021  . SHOULDER SURGERY Right 2007  . SPINAL CORD STIMULATOR IMPLANT  2012   battery replacement 2017 and 2021    There were no vitals filed for this visit.   Subjective Assessment - 10/29/20 1041    Subjective Pt reports she is doing well. Husband joined today.    Currently in Pain? No/denies                 ADULT SLP TREATMENT - 10/29/20 1041      General Information   Behavior/Cognition Alert;Pleasant mood      Treatment Provided   Treatment provided Cognitive-Linquistic      Cognitive-Linquistic Treatment   Treatment focused on Cognition     Skilled Treatment Trained patient in memory strategies. To address internal strategies next session.      Assessment / Recommendations / Plan   Plan Continue with current plan of care      Progression Toward Goals   Progression toward goals Progressing toward goals              SLP Short Term Goals - 10/29/20 1046      SLP SHORT TERM GOAL #1   Title Pt will identify 5 semantic features of a provided stimulus given verbal and visual cues.    Time 1    Period Weeks    Status On-going      SLP SHORT TERM GOAL #2   Title Pt will recall 3 word finding strategies to assist with expression of thoughts.    Time 1    Status On-going      SLP SHORT TERM GOAL #3   Title Pt will recall 3 strategies to assist with working memory.    Time 1    Period Weeks    Status On-going  SLP Long Term Goals - 10/29/20 1046      SLP LONG TERM GOAL #1   Title Client will utilize compensatory strategies to communicate wants and needs effectively to different conversational partners and participate socially in functional living environment.    Time 5    Period Weeks    Status On-going      SLP LONG TERM GOAL #2   Title Patient will demonstrate use of information processing, organization, and reasoning during daily living activities to improve safety and awareness in functional living environment    Time 5    Period Weeks    Status On-going      SLP LONG TERM GOAL #3   Title Patient will demonstrate use of memory strategies to schedule activities, recall weekly events and items to maintain safety to participate socially in functional living environment    Time 5    Period Weeks    Status On-going            Plan - 10/29/20 1044    Clinical Impression Statement Pt was able to provide examples of how she was using some memory strategies at home.SLP and pt  brainstormed ideas to use at memory. SLP rec skilled speech services to address mild cognitive-communication impairment to  reduce frustration and increase participation in ADLs.    Speech Therapy Frequency 2x / week    Duration 8 weeks    Treatment/Interventions Cueing hierarchy;Functional tasks;Patient/family education;Environmental controls;Cognitive reorganization;Multimodal communcation approach;Language facilitation;Compensatory techniques;Internal/external aids;SLP instruction and feedback    Potential to Achieve Goals Good    Potential Considerations Ability to learn/carryover information    Consulted and Agree with Plan of Care Patient           Patient will benefit from skilled therapeutic intervention in order to improve the following deficits and impairments:   Cognitive communication deficit  Aphasia    Problem List Patient Active Problem List   Diagnosis Date Noted  . Post concussive syndrome 05/21/2020  . Hx of multiple concussions 05/21/2020  . Dyslipidemia 03/28/2020  . Type 2 diabetes mellitus with hyperglycemia, without long-term current use of insulin ([place]) 08/16/2019  . Type 2 diabetes mellitus with diabetic polyneuropathy, without long-term current use of insulin ([place]) 08/16/2019  . Dilated aortic root ([place])   . Plantar fasciitis 06/01/2018  . Hypokalemia 02/17/2016  . Chest pain 02/16/2016  . Morbid obesity with BMI of 40.0-44.9, adult ([place]) 05/15/2013  . Fatty liver   . OSA (obstructive sleep apnea)   . Hypertension   . Diabetes mellitus without complication ([place])   . Asthma     Verdene Lennert MS, [place], CBIS  10/29/2020, 10:48 AM  [facility]. [place], [place], [address] Phone: [phone]   Fax:  [phone]   Name: [name] MRN: [id number] Date of Birth: January 16, 1966

## 2020-10-30 ENCOUNTER — Encounter: Payer: 59 | Attending: Psychology

## 2020-10-30 DIAGNOSIS — F0781 Postconcussional syndrome: Secondary | ICD-10-CM | POA: Insufficient documentation

## 2020-10-30 DIAGNOSIS — F4312 Post-traumatic stress disorder, chronic: Secondary | ICD-10-CM | POA: Diagnosis not present

## 2020-10-30 DIAGNOSIS — R41840 Attention and concentration deficit: Secondary | ICD-10-CM | POA: Diagnosis not present

## 2020-10-30 DIAGNOSIS — R413 Other amnesia: Secondary | ICD-10-CM | POA: Insufficient documentation

## 2020-10-30 DIAGNOSIS — Z87820 Personal history of traumatic brain injury: Secondary | ICD-10-CM | POA: Insufficient documentation

## 2020-10-30 DIAGNOSIS — M25512 Pain in left shoulder: Secondary | ICD-10-CM | POA: Diagnosis not present

## 2020-10-30 DIAGNOSIS — R519 Headache, unspecified: Secondary | ICD-10-CM | POA: Insufficient documentation

## 2020-10-30 DIAGNOSIS — R4701 Aphasia: Secondary | ICD-10-CM | POA: Insufficient documentation

## 2020-10-30 DIAGNOSIS — M6281 Muscle weakness (generalized): Secondary | ICD-10-CM | POA: Diagnosis not present

## 2020-10-31 ENCOUNTER — Ambulatory Visit: Payer: 59 | Admitting: Speech Pathology

## 2020-11-04 DIAGNOSIS — M545 Low back pain, unspecified: Secondary | ICD-10-CM | POA: Diagnosis not present

## 2020-11-05 ENCOUNTER — Ambulatory Visit: Payer: 59 | Admitting: Speech Pathology

## 2020-11-18 DIAGNOSIS — M25512 Pain in left shoulder: Secondary | ICD-10-CM | POA: Diagnosis not present

## 2020-11-18 DIAGNOSIS — G8929 Other chronic pain: Secondary | ICD-10-CM | POA: Diagnosis not present

## 2020-11-18 DIAGNOSIS — M542 Cervicalgia: Secondary | ICD-10-CM | POA: Diagnosis not present

## 2020-11-19 NOTE — Progress Notes (Signed)
Neuropsychology Note  [name] completed 240 minutes of neuropsychological testing with technician, Dina Rich, BA, under the supervision of Ilean Skill, PsyD., Clinical Neuropsychologist. The patient did not appear overtly distressed by the testing session, per behavioral observation or via self-report to the technician. Rest breaks were offered.   Clinical Decision Making: In considering the patient's current level of functioning, level of presumed impairment, nature of symptoms, emotional and behavioral responses during clinical interview, level of literacy, and observed level of motivation/effort, a battery of tests was selected by Dr. Sima Matas during initial consultation on 10/02/20. This was communicated to the technician. Communication between the neuropsychologist and technician was ongoing throughout the testing session and changes were made as deemed necessary based on patient performance on testing, technician observations and additional pertinent factors such as those listed above.  Tests Administered: . Wechsler Adult Intelligence Scale, 4th Edition (WAIS-IV) . Wechsler Memory Scale, 4th Edition (WMS-IV); Adult Battery   Results:  Composite Score Summary  Scale Sum of Scaled Scores Composite Score Percentile Rank 95% Conf. Interval Qualitative Description  Verbal Comprehension 27 VCI 95 37 90-101 Average  Perceptual Reasoning 27 PRI 94 34 88-101 Average  Working Memory 14 WMI 83 13 77-91 Low Average  Processing Speed 22 PSI 105 63 96-113 Average  Full Scale 90 FSIQ 93 32 89-97 Average  General Ability 54 GAI 94 34 89-99 Average    Verbal Comprehension Subtests Summary  Subtest Raw Score Scaled Score Percentile Rank Reference Group Scaled Score SEM  Similarities 26 10 50 10 1.04  Vocabulary 40 10 50 11 0.73  Information 9 7 16 8  0.73  (Comprehension) 27 11 63 12 1.16        Perceptual Reasoning Subtests Summary  Subtest Raw Score Scaled  Score Percentile Rank Reference Group Scaled Score SEM  Block Design 32 8 25 7  0.95  Matrix Reasoning 18 11 63 9 0.95  Visual Puzzles 11 8 25 7  0.85  (Figure Weights) 17 13 84 11 0.90  (Picture Completion) 13 10 50 9 1.24        Working Doctor, general practice Raw Score Scaled Score Percentile Rank Reference Group Scaled Score SEM  Digit Span 26 9 37 9 0.73  Arithmetic 8 5 5 5  0.90  (Letter-Number Seq.) 19 9 37 9 1.04        Processing Speed Subtests Summary  Subtest Raw Score Scaled Score Percentile Rank Reference Group Scaled Score SEM  Symbol Search 31 10 50 9 1.56  Coding 73 12 75 10 1.20  (Cancellation) 25 6 9 5  1.62          Index Score Summary  Index Sum of Scaled Scores Index Score Percentile Rank 95% Confidence Interval Qualitative Descriptor  Auditory Memory (AMI) 48 112 79 105-118 High Average  Visual Memory (VMI) 38 96 39 91-102 Average  Visual Working Memory (VWMI) 25 115 84 107-121 High Average  Immediate Memory (IMI) 45 108 70 101-114 Average  Delayed Memory (DMI) 41 102 55 95-109 Average    Primary Subtest Scaled Score Summary  Subtest Domain Raw Score Scaled Score Percentile Rank  Logical Memory I AM 33 13 84  Logical Memory II AM 20 10 50  Verbal [employer]ired Associates I AM 35 11 63  Verbal [facility] [facility] II AM 13 14 91  Designs I VM 70 10 50  Designs II VM 46 8 25  Visual Reproduction I VM 38 11 63  Visual Reproduction II VM 22  9 37  Spatial Addition VWM 17 13 84  Symbol Span VWM 28 12 75    Auditory Memory Process Score Summary  Process Score Raw Score Scaled Score Percentile Rank Cumulative Percentage (Base Rate)  LM II Recognition 25 - - 51-75%  VPA II Recognition 40 - - >75%        Visual Memory Process Score Summary  Process Score Raw Score Scaled Score Percentile Rank Cumulative Percentage (Base Rate)  DE I Content 33 9 37 -  DE I Spatial 19 12 75 -  DE II Content 35 10 50 -  DE II Spatial 9 7 16  -  DE II  Recognition 12 - - 17-25%  VR II Recognition 7 - - >75%      ABILITY-MEMORY ANALYSIS  Ability Score:  VCI: 95 Date of Testing:  WAIS-IV; WMS-IV 2020/10/30  Predicted Difference Method   Index Predicted WMS-IV Index Score Actual WMS-IV Index Score Difference Critical Value  Significant Difference Y/N Base Rate  Auditory Memory 97 112 -15 9.43 Y 10-15%  Visual Memory 98 96 2 9.19 N   Visual Working Memory 97 115 -18 11.15 Y 5-10%  Immediate Memory 97 108 -11 10.27 Y 20%  Delayed Memory 97 102 -5 10.03 N   Statistical significance (critical value) at the .01 level.    Feedback to Patient: [name] will return for an interactive feedback session with Dr. Sima Matas at which time her test performances, clinical impressions and treatment recommendations will be reviewed in detail. The patient understands she can contact our office should she require our assistance before this time. 240 minutes spent face-to-face with patient administering standardized tests, 30 minutes spent scoring Environmental education officer). [CPT [id number], [id number]]  Full report to follow.

## 2020-11-20 ENCOUNTER — Ambulatory Visit: Payer: 59 | Admitting: Speech Pathology

## 2020-11-20 ENCOUNTER — Encounter: Payer: Self-pay | Admitting: Psychology

## 2020-11-20 ENCOUNTER — Ambulatory Visit: Payer: Medicare Other | Admitting: Adult Health

## 2020-11-20 ENCOUNTER — Other Ambulatory Visit: Payer: Self-pay

## 2020-11-20 ENCOUNTER — Encounter: Payer: 59 | Attending: Psychology | Admitting: Psychology

## 2020-11-20 ENCOUNTER — Encounter: Payer: Self-pay | Admitting: Adult Health

## 2020-11-20 DIAGNOSIS — R413 Other amnesia: Secondary | ICD-10-CM | POA: Diagnosis not present

## 2020-11-20 DIAGNOSIS — F0781 Postconcussional syndrome: Secondary | ICD-10-CM

## 2020-11-20 DIAGNOSIS — R41841 Cognitive communication deficit: Secondary | ICD-10-CM | POA: Diagnosis not present

## 2020-11-20 DIAGNOSIS — Z87820 Personal history of traumatic brain injury: Secondary | ICD-10-CM
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
## 2020-11-22 ENCOUNTER — Encounter: Payer: Self-pay | Admitting: Speech Pathology

## 2020-11-22 ENCOUNTER — Other Ambulatory Visit: Payer: Self-pay

## 2020-11-22 ENCOUNTER — Ambulatory Visit: Payer: 59 | Admitting: Speech Pathology

## 2020-11-22 DIAGNOSIS — F0781 Postconcussional syndrome: Secondary | ICD-10-CM | POA: Diagnosis not present

## 2020-11-22 DIAGNOSIS — R41841 Cognitive communication deficit: Secondary | ICD-10-CM

## 2020-11-22 NOTE — Therapy (Signed)
[place]. [place], [place], [address] Phone: [phone]   Fax:  [phone]  Speech Language Pathology Treatment & Recert  Patient Details  Name: [name] MRN: [id number] Date of Birth: September 19, 1965 No data recorded  Encounter Date: 11/22/2020   End of Session - 11/22/20 0956     Visit Number 8    Number of Visits 17    Date for SLP Re-Evaluation 12/22/20    Authorization - Number of Visits 1    SLP Start Time [phone]   pt late   SLP Stop Time  1013    SLP Time Calculation (min) 35 min    Activity Tolerance Patient tolerated treatment well             Past Medical History:  Diagnosis Date   Anxiety    per notes from Dr Rolena Infante (orthopedics)   Asthma    Depression    Diabetes mellitus without complication ([place])    Dilated aortic root ([facility])    40m by CTA 07/2018   Fatty liver    H/O cluster headache    High cholesterol    Hypertension    Morbid obesity with BMI of 40.0-44.9, adult (HCC)    OSA (obstructive sleep apnea)    mild with AHI 11/hr now on CPAP at 20cm H2O   Swelling    legs/feet/hands    Past Surgical History:  Procedure Laterality Date   ABDOMINAL HYSTERECTOMY  2006   BACK SURGERY     l4-5   BACK SURGERY  2006   x2   [place]   KNEE SURGERY  06/15/1985   SHOULDER SURGERY Left 2020 and 2021   SHOULDER SURGERY Right 2007   SPINAL CORD STIMULATOR IMPLANT  2012   battery replacement 2017 and 2021    There were no vitals filed for this visit.   Subjective Assessment - 11/22/20 0939     Subjective Pt reports her head is hurting this morning.    Currently in Pain? Yes    Pain Score 5     Pain Location Head    Pain Descriptors / Indicators Throbbing    Pain Onset Today                   ADULT SLP TREATMENT - 11/22/20 0941       General Information   Behavior/Cognition Alert;Pleasant mood       Treatment Provided   Treatment provided Cognitive-Linquistic      Cognitive-Linquistic Treatment   Treatment focused on Cognition    Skilled Treatment Spoke w/ pt about progress with scheduling counseling services. SLp trained pt in internal memory strategies.      Assessment / Recommendations / Plan   Plan Continue with current plan of care      Progression Toward Goals   Progression toward goals Progressing toward goals                SLP Short Term Goals - 11/22/20 1011       SLP SHORT TERM GOAL #1   Title Pt will identify 5 semantic features of a provided stimulus given verbal and visual cues.    Time 1    Period Weeks    Status On-going      SLP SHORT TERM GOAL #2   Title Pt will recall 3 word finding strategies to  assist with expression of thoughts.    Time 1    Status On-going      SLP SHORT TERM GOAL #3   Title Pt will recall 3 strategies to assist with working memory.    Time 1    Period Weeks    Status On-going              SLP Long Term Goals - 11/22/20 1011       SLP LONG TERM GOAL #1   Title Client will utilize compensatory strategies to communicate wants and needs effectively to different conversational partners and participate socially in functional living environment.    Time 5    Period Weeks    Status On-going      SLP LONG TERM GOAL #2   Title Patient will demonstrate use of information processing, organization, and reasoning during daily living activities to improve safety and awareness in functional living environment    Time 5    Period Weeks    Status On-going      SLP LONG TERM GOAL #3   Title Patient will demonstrate use of memory strategies to schedule activities, recall weekly events and items to maintain safety to participate socially in functional living environment    Time 5    Period Weeks    Status On-going              Plan - 11/22/20 1010     Clinical Impression Statement Pt was able to provide  examples of how she was using some memory strategies at home.SLP and pt  brainstormed ideas to use internal memory strategies. SLP rec skilled speech services to address mild cognitive-communication impairment to reduce frustration and increase participation in ADLs.    Speech Therapy Frequency 2x / week    Duration 8 weeks    Treatment/Interventions Cueing hierarchy;Functional tasks;Patient/family education;Environmental controls;Cognitive reorganization;Multimodal communcation approach;Language facilitation;Compensatory techniques;Internal/external aids;SLP instruction and feedback    Potential to Achieve Goals Good    Potential Considerations Ability to learn/carryover information    Consulted and Agree with Plan of Care Patient             Patient will benefit from skilled therapeutic intervention in order to improve the following deficits and impairments:   Cognitive communication deficit    Problem List Patient Active Problem List   Diagnosis Date Noted   Post concussive syndrome 05/21/2020   Hx of multiple concussions 05/21/2020   Dyslipidemia 03/28/2020   Type 2 diabetes mellitus with hyperglycemia, without long-term current use of insulin ([facility]) 08/16/2019   Type 2 diabetes mellitus with diabetic polyneuropathy, without long-term current use of insulin ([facility]) 08/16/2019   Dilated aortic root ([facility])    Plantar fasciitis 06/01/2018   Hypokalemia 02/17/2016   Chest pain 02/16/2016   Morbid obesity with BMI of 40.0-44.9, adult ([facility]) 05/15/2013   Fatty liver    OSA (obstructive sleep apnea)    Hypertension    Diabetes mellitus without complication ([facility])    Asthma    *Pt recert ran out. To see one additional visit and d/c.  Speech Therapy Progress Note  Dates of Reporting Period: 09/05/20 to 11/05/20  Objective Reports of Subjective Statement: Reports improvement at home. Seeking out counseling services to assist with managing anxiety/depression.  Objective Measurements: Is  able to recall memory/communication/attention strategies to use at home independently.   Goal Update: Achieved.  Plan: To d/c next session.  Reason Skilled Services are Required: Rec x1 session for d/c. Pt has met  all goals.   Rosann Auerbach [place] MS, [place], CBIS  11/22/2020, 10:14 AM  [place]. [place], [place], [address] Phone: [phone]   Fax:  [phone]   Name: Aditri Louischarles MRN: [id number] Date of Birth: 1965-07-10

## 2020-11-25 DIAGNOSIS — M25512 Pain in left shoulder: Secondary | ICD-10-CM | POA: Diagnosis not present

## 2020-11-25 DIAGNOSIS — M6281 Muscle weakness (generalized): Secondary | ICD-10-CM | POA: Diagnosis not present

## 2020-11-27 ENCOUNTER — Encounter: Payer: Self-pay | Admitting: Speech Pathology

## 2020-11-27 ENCOUNTER — Ambulatory Visit: Payer: 59 | Admitting: Speech Pathology

## 2020-11-27 ENCOUNTER — Other Ambulatory Visit: Payer: Self-pay

## 2020-11-27 DIAGNOSIS — R41841 Cognitive communication deficit: Secondary | ICD-10-CM | POA: Diagnosis not present

## 2020-11-27 DIAGNOSIS — F0781 Postconcussional syndrome: Secondary | ICD-10-CM | POA: Diagnosis not present

## 2020-11-27 NOTE — Therapy (Signed)
[place]. [place], [place], [address] Phone: [phone]   Fax:  [phone]  Speech Language Pathology Treatment  Patient Details  Name: [name] MRN: [id number] Date of Birth: Dec 01, 1965 No data recorded  Encounter Date: 11/27/2020   End of Session - 11/27/20 1705     Visit Number 9    Date for SLP Re-Evaluation 12/22/20    Authorization - Number of Visits 2    SLP Start Time 1615    SLP Stop Time  1706    SLP Time Calculation (min) 51 min    Activity Tolerance Patient tolerated treatment well             Past Medical History:  Diagnosis Date   Anxiety    per notes from Dr Rolena Infante (orthopedics)   Asthma    Depression    Diabetes mellitus without complication ([place] [facility])    Dilated aortic root ([facility])    84mm by CTA 07/2018   Fatty liver    H/O cluster headache    High cholesterol    Hypertension    Morbid obesity with BMI of 40.0-44.9, adult (HCC)    OSA (obstructive sleep apnea)    mild with AHI 11/hr now on CPAP at 20cm H2O   Swelling    legs/feet/hands    Past Surgical History:  Procedure Laterality Date   ABDOMINAL HYSTERECTOMY  2006   BACK SURGERY     l4-5   BACK SURGERY  2006   x2   CARPAL TUNNEL RELEASE     right   [place] and 2005   KNEE SURGERY  06/15/1985   SHOULDER SURGERY Left 2020 and 2021   SHOULDER SURGERY Right 2007   SPINAL CORD STIMULATOR IMPLANT  2012   battery replacement 2017 and 2021    There were no vitals filed for this visit.   Subjective Assessment - 11/27/20 1701     Subjective Pt reports stress and being overwhelmed.    Currently in Pain? No/denies                   ADULT SLP TREATMENT - 11/27/20 1701       General Information   Behavior/Cognition Alert;Pleasant mood      Treatment Provided   Treatment provided Cognitive-Linquistic      Cognitive-Linquistic Treatment   Treatment focused on Cognition     Skilled Treatment Spoke about prioritizing duties at home to decrease mental stress/agitation. Retrained pt in relaxation strategies for brain injury. Also developed a plan for scheduling time for herself in the morning by using external memory strategy (alarm).      Assessment / Recommendations / Plan   Plan Continue with current plan of care      Progression Toward Goals   Progression toward goals Progressing toward goals                SLP Short Term Goals - 11/27/20 1707       SLP SHORT TERM GOAL #1   Title Pt will identify 5 semantic features of a provided stimulus given verbal and visual cues.    Time 1    Period Weeks    Status Achieved      SLP SHORT TERM GOAL #2   Title Pt will recall 3 word finding strategies to assist with expression of thoughts.    Time 1    Status Achieved      SLP  SHORT TERM GOAL #3   Title Pt will recall 3 strategies to assist with working memory.    Time 1    Period Weeks    Status Achieved      SLP SHORT TERM GOAL #4   Title Pt will recall 3 strategies to assist with organization.    Time 2    Period Weeks    Status New              SLP Long Term Goals - 11/27/20 1709       SLP LONG TERM GOAL #1   Title Client will utilize compensatory strategies to communicate wants and needs effectively to different conversational partners and participate socially in functional living environment.    Period Weeks    Status Achieved      SLP LONG TERM GOAL #2   Title Patient will demonstrate use of information processing, organization, and reasoning during daily living activities to improve safety and awareness in functional living environment    Time 4    Period Weeks    Status On-going      SLP LONG TERM GOAL #3   Title Patient will demonstrate use of memory strategies to schedule activities, recall weekly events and items to maintain safety to participate socially in functional living environment    Time 4    Period Weeks     Status On-going              Plan - 11/27/20 1707     Clinical Impression Statement Pt was able to provide examples of how she was using some organizational strategies at home.  SLP rec skilled speech services to address mild cognitive-communication impairment to reduce frustration and increase participation in ADLs.    Speech Therapy Frequency 2x / week    Duration 8 weeks    Treatment/Interventions Cueing hierarchy;Functional tasks;Patient/family education;Environmental controls;Cognitive reorganization;Multimodal communcation approach;Language facilitation;Compensatory techniques;Internal/external aids;SLP instruction and feedback    Potential to Achieve Goals Good    Potential Considerations Ability to learn/carryover information    Consulted and Agree with Plan of Care Patient             Patient will benefit from skilled therapeutic intervention in order to improve the following deficits and impairments:   Cognitive communication deficit    Problem List Patient Active Problem List   Diagnosis Date Noted   Post concussive syndrome 05/21/2020   Hx of multiple concussions 05/21/2020   Dyslipidemia 03/28/2020   Type 2 diabetes mellitus with hyperglycemia, without long-term current use of insulin ([facility]) 08/16/2019   Type 2 diabetes mellitus with diabetic polyneuropathy, without long-term current use of insulin ([facility]) 08/16/2019   Dilated aortic root (HCC)    Plantar fasciitis 06/01/2018   Hypokalemia 02/17/2016   Chest pain 02/16/2016   Morbid obesity with BMI of 40.0-44.9, adult ([facility]) 05/15/2013   Fatty liver    OSA (obstructive sleep apnea)    Hypertension    Diabetes mellitus without complication ([facility])    Asthma     Verdene Lennert MS, [place], CBIS  11/27/2020, 5:14 PM  [place]. [place], [place], [address] Phone: [phone]   Fax:  [phone]   Name: [name] MRN:  [id number] Date of Birth: February 15, 1966

## 2020-11-29 DIAGNOSIS — M6281 Muscle weakness (generalized): Secondary | ICD-10-CM | POA: Diagnosis not present

## 2020-11-29 DIAGNOSIS — M25512 Pain in left shoulder: Secondary | ICD-10-CM | POA: Diagnosis not present

## 2020-12-02 ENCOUNTER — Ambulatory Visit: Payer: 59 | Admitting: Speech Pathology

## 2020-12-03 DIAGNOSIS — M6281 Muscle weakness (generalized): Secondary | ICD-10-CM | POA: Diagnosis not present

## 2020-12-03 DIAGNOSIS — M25512 Pain in left shoulder: Secondary | ICD-10-CM | POA: Diagnosis not present

## 2020-12-05 DIAGNOSIS — L918 Other hypertrophic disorders of the skin: Secondary | ICD-10-CM | POA: Diagnosis not present

## 2020-12-05 DIAGNOSIS — D485 Neoplasm of uncertain behavior of skin: Secondary | ICD-10-CM | POA: Diagnosis not present

## 2020-12-05 DIAGNOSIS — M25512 Pain in left shoulder: Secondary | ICD-10-CM | POA: Diagnosis not present

## 2020-12-05 DIAGNOSIS — L720 Epidermal cyst: Secondary | ICD-10-CM | POA: Diagnosis not present

## 2020-12-05 DIAGNOSIS — M6281 Muscle weakness (generalized): Secondary | ICD-10-CM | POA: Diagnosis not present

## 2020-12-09 ENCOUNTER — Ambulatory Visit: Payer: 59 | Admitting: Psychology

## 2020-12-10 ENCOUNTER — Other Ambulatory Visit: Payer: Self-pay

## 2020-12-10 ENCOUNTER — Encounter: Payer: Self-pay | Admitting: Speech Pathology

## 2020-12-10 ENCOUNTER — Ambulatory Visit: Payer: 59 | Admitting: Speech Pathology

## 2020-12-10 DIAGNOSIS — R41841 Cognitive communication deficit: Secondary | ICD-10-CM | POA: Diagnosis not present

## 2020-12-10 DIAGNOSIS — F0781 Postconcussional syndrome: Secondary | ICD-10-CM | POA: Diagnosis not present

## 2020-12-10 NOTE — Therapy (Signed)
[place]. [place], [place], [address] Phone: [phone]   Fax:  [phone]  Speech Language Pathology Treatment & Discharge Summary  Patient Details  Name: [name] MRN: [id number] Date of Birth: Oct 07, 1965 No data recorded  Encounter Date: 12/10/2020   End of Session - 12/10/20 1456     Visit Number 10    Date for SLP Re-Evaluation 12/22/20    Authorization - Number of Visits 3    SLP Start Time 1110    SLP Stop Time  1200    SLP Time Calculation (min) 50 min    Activity Tolerance Patient tolerated treatment well             Past Medical History:  Diagnosis Date   Anxiety    per notes from Dr Rolena Infante (orthopedics)   Asthma    Depression    Diabetes mellitus without complication ([facility])    Dilated aortic root ([facility])    32m by CTA 07/2018   Fatty liver    H/O cluster headache    High cholesterol    Hypertension    Morbid obesity with BMI of 40.0-44.9, adult (HCC)    OSA (obstructive sleep apnea)    mild with AHI 11/hr now on CPAP at 20cm H2O   Swelling    legs/feet/hands    Past Surgical History:  Procedure Laterality Date   ABDOMINAL HYSTERECTOMY  2006   BACK SURGERY     l4-5   BACK SURGERY  2006   x2   CARPAL TUNNEL RELEASE     right   [place] 2005   KNEE SURGERY  06/15/1985   SHOULDER SURGERY Left 2020 and 2021   SHOULDER SURGERY Right 2007   SPINAL CORD STIMULATOR IMPLANT  2012   battery replacement 2017 and 2021    There were no vitals filed for this visit.   Subjective Assessment - 12/10/20 1114     Subjective Reports having a really bad day last week where she completely shut down and felt like she just "lost it".    Currently in Pain? No/denies                   ADULT SLP TREATMENT - 12/10/20 1452       General Information   Behavior/Cognition Alert;Pleasant mood      Treatment Provided   Treatment provided  Cognitive-Linquistic      Cognitive-Linquistic Treatment   Treatment focused on Cognition;Patient/family/caregiver education    Skilled Treatment Developed a list that captured and summarized her time in speech therapy along with areas to improve on. Discussed to-do lists, communication breakdowns, self-care. Assisted pt in scheduling psych appt. SLP and pt in agreement that she needs to take some of these strategies and attempt to implement them at home to see success in daily life. SLP to d/c from therapy at this time.      Assessment / Recommendations / Plan   Plan All goals met;Discharge SLP treatment due to (comment)      Progression Toward Goals   Progression toward goals Goals met, education completed, [place] [facility] [place] [place]/10/20 [place]#1   Title Pt will identify 5 semantic features of a provided stimulus given verbal and visual cues.  Time 1    Period Weeks    Status Achieved      SLP SHORT TERM GOAL #2   Title Pt will recall 3 word finding strategies to assist with expression of thoughts.    Time 1    Status Achieved      SLP SHORT TERM GOAL #3   Title Pt will recall 3 strategies to assist with working memory.    Time 1    Period Weeks    Status Achieved      SLP SHORT TERM GOAL #4   Title Pt will recall 3 strategies to assist with organization.    Time 2    Period Weeks    Status Achieved              SLP Long Term Goals - 12/10/20 1458       SLP LONG TERM GOAL #1   Title Client will utilize compensatory strategies to communicate wants and needs effectively to different conversational partners and participate socially in functional living environment.    Period Weeks    Status Achieved      SLP LONG TERM GOAL #2   Title Patient will demonstrate use of information processing, organization, and reasoning during daily living activities to improve safety and awareness in  functional living environment    Time 4    Period Weeks    Status Achieved      SLP LONG TERM GOAL #3   Title Patient will demonstrate use of memory strategies to schedule activities, recall weekly events and items to maintain safety to participate socially in functional living environment    Time 4    Period Weeks    Status Achieved              Plan - 12/10/20 1456     Clinical Impression Statement SLP to d/c from therapy services at this time due to meeting of goals. Pt needs the opportunity to implement strategies at home in order to see progress in her daily life. All edu has been completed. SLP rec f/u with psych services to assist with anxiety and depression.    Speech Therapy Frequency 2x / week    Duration 8 weeks    Treatment/Interventions Cueing hierarchy;Functional tasks;Patient/family education;Environmental controls;Cognitive reorganization;Multimodal communcation approach;Language facilitation;Compensatory techniques;Internal/external aids;SLP instruction and feedback    Potential to Achieve Goals Good    Potential Considerations Ability to learn/carryover information    Consulted and Agree with Plan of Care Patient             Patient will benefit from skilled therapeutic intervention in order to improve the following deficits and impairments:   Cognitive communication deficit    Problem List Patient Active Problem List   Diagnosis Date Noted   Post concussive syndrome 05/21/2020   Hx of multiple concussions 05/21/2020   Dyslipidemia 03/28/2020   Type 2 diabetes mellitus with hyperglycemia, without long-term current use of insulin ([facility]) 08/16/2019   Type 2 diabetes mellitus with diabetic polyneuropathy, without long-term current use of insulin ([facility]) 08/16/2019   Dilated aortic root ([place])    Plantar fasciitis 06/01/2018   Hypokalemia 02/17/2016   Chest pain 02/16/2016   Morbid obesity with BMI of 40.0-44.9, adult ([facility]) 05/15/2013   Fatty liver    OSA  (obstructive sleep apnea)    Hypertension    Diabetes mellitus without complication ([facility])    Asthma    SPEECH THERAPY DISCHARGE SUMMARY  Visits from Start of Care: 10  Current functional level related to goals / functional outcomes: Pt continues to report difficulty with attention and losing train of thought. Also reports moodiness and extreme overwhelm. Pt has applied some of ST to her ADLs, but reports that she hasn't had the time to implement strategies we have discussed. SLP believes pt would benefit most from counseling at this time so she feels more capable and in control of her mental health (anxiety/depression). Pt in agreement.   Remaining deficits: Attention   Education / Equipment: Edu completed.   Patient agrees to discharge. Patient goals were met. Patient is being discharged due to meeting the stated rehab goals.Verdene Lennert MS, [place], CBIS  12/10/2020, 2:59 PM  [place]. [place], [place], [address] Phone: [phone]   Fax:  [phone]   Name: [name] MRN: [id number] Date of Birth: [birth year]

## 2020-12-18 ENCOUNTER — Encounter: Payer: 59 | Admitting: Speech Pathology

## 2020-12-24 DIAGNOSIS — M25512 Pain in left shoulder: Secondary | ICD-10-CM | POA: Diagnosis not present

## 2020-12-24 DIAGNOSIS — M6281 Muscle weakness (generalized): Secondary | ICD-10-CM | POA: Diagnosis not present

## 2020-12-30 ENCOUNTER — Other Ambulatory Visit: Payer: Self-pay | Admitting: Family Medicine

## 2020-12-30 DIAGNOSIS — R160 Hepatomegaly, not elsewhere classified: Secondary | ICD-10-CM

## 2021-01-02 DIAGNOSIS — M25512 Pain in left shoulder: Secondary | ICD-10-CM | POA: Diagnosis not present

## 2021-01-02 DIAGNOSIS — M6281 Muscle weakness (generalized): Secondary | ICD-10-CM | POA: Diagnosis not present

## 2021-01-06 DIAGNOSIS — M6281 Muscle weakness (generalized): Secondary | ICD-10-CM | POA: Diagnosis not present

## 2021-01-06 DIAGNOSIS — M25512 Pain in left shoulder: Secondary | ICD-10-CM | POA: Diagnosis not present

## 2021-01-13 ENCOUNTER — Ambulatory Visit: Payer: Self-pay | Admitting: Surgery

## 2021-01-13 DIAGNOSIS — L720 Epidermal cyst: Secondary | ICD-10-CM | POA: Diagnosis not present

## 2021-01-13 DIAGNOSIS — D171 Benign lipomatous neoplasm of skin and subcutaneous tissue of trunk: Secondary | ICD-10-CM | POA: Diagnosis not present

## 2021-01-13 NOTE — H&P (Signed)
History of Present Illness: [name] is a 55 y.o. female who is seen today as an office consultation at the request of Gloris Manchester for evaluation of a cyst and lipoma, both on the back. The cyst is on her left upper back and has been present for many years. It sometimes stings and burns, and she has been able to express thick fluid from it. It always reaccumulates after this. She also has a lump on her right upper back that she says hurts when touched. It does not drain anything. She was evaluated by her dermatologist and referred here. Both lesions are bothering her and she would like to have them removed.      Review of Systems: A complete review of systems was obtained from the patient.  I have reviewed this information and discussed as appropriate with the patient.  See HPI as well for other ROS.       Medical History: Past Medical History Past Medical History: Diagnosis Date  Asthma, unspecified asthma severity, unspecified whether complicated, unspecified whether persistent    Diabetes mellitus without complication (CMS-HCC)    Hypertension    Sleep apnea        Patient Active Problem List Diagnosis  Lipoma of torso  Epidermal inclusion cyst     Past Surgical History Past Surgical History: Procedure Laterality Date  c-section surgery  N/A    l4-5 fusion surgery N/A    right hand surgery Right    shoulder surgery  Bilateral    spine stimulator surgery  N/A        Allergies Allergies Allergen Reactions  Adhesive Tape-Silicones Itching and Rash      Current Outpatient Medications on File Prior to Visit Medication Sig Dispense Refill  blood glucose diagnostic (ONETOUCH ULTRA TEST) test strip USE TO TEST BLOOD SUGAR EVERY DAY AS DIRECTED      blood glucose diagnostic (ONETOUCH ULTRA TEST) test strip USE TO TEST BLOOD SUGAR EVERY DAY AS DIRECTED      dulaglutide (TRULICITY) [id number] [id number] mL pen injector        gabapentin (NEURONTIN) 300 MG  capsule Take 300 mg by mouth 2 (two) times daily      glimepiride (AMARYL) 4 MG tablet TAKE 1 TABLET BY MOUTH EVERY DAY WITH BREAKFAST OR THE FIRST MAIN MEAL OF THE DAY      HYDROcodone-acetaminophen (NORCO) 5-325 mg tablet Take by mouth      amLODIPine (NORVASC) 10 MG tablet Take 10 mg by mouth once daily      atorvastatin (LIPITOR) 10 MG tablet        calcium carbonate-vitamin D3 600 mg-20 mcg (800 unit) Tab Take by mouth      fluconazole (DIFLUCAN) 150 MG tablet Take 150 mg by mouth once a week      metFORMIN (GLUCOPHAGE-XR) 500 MG XR tablet TAKE 4 TABLETS BY MOUTH EVERY DAY WITH THE EVENING MEAL      TRULICITY 1.5 99991111 mL subcutaneous injection ADMINISTER 1.5 MG UNDER THE SKIN 1 TIME A WEEK       No current facility-administered medications on file prior to visit.     Family History Family History Problem Relation Age of Onset  Skin cancer Mother    Obesity Mother    High blood pressure (Hypertension) Mother    Diabetes Mother    Deep vein thrombosis (DVT or abnormal blood clot formation) Mother    High blood pressure (Hypertension) Father    Hyperlipidemia (Elevated cholesterol) Father  Diabetes Father    Skin cancer Sister    High blood pressure (Hypertension) Sister    Diabetes Sister    High blood pressure (Hypertension) Brother    Diabetes Brother        Social History   Tobacco Use Smoking Status Former Smoker Smokeless Tobacco Former Systems developer     Social History Social History    Socioeconomic History  Marital status: Married Tobacco Use  Smoking status: Former Smoker  Smokeless tobacco: Former Administrator, [employer] Use: Never used Substance and Sexual Activity  Alcohol use: Yes  Drug use: Never      Objective:     Vitals:   01/13/21 1514 BP: (!) 160/98 Pulse: 83 Temp: 36.8 C (98.2 F) SpO2: 98% Weight: (!) 105.1 kg (231 lb 9.6 oz) Height: 167.6 cm ('5\' 6"'$ )   Body mass index is 37.38 kg/m.   Physical Exam Vitals reviewed.   Constitutional:      General: She is not in acute distress.    Appearance: Normal appearance.  HENT:     Head: Normocephalic and atraumatic.  Eyes:     General: No scleral icterus.    Conjunctiva/sclera: Conjunctivae normal.  Cardiovascular:     Rate and Rhythm: Normal rate and regular rhythm.     Heart sounds: No murmur heard. Pulmonary:     Effort: Pulmonary effort is normal. No respiratory distress.     Breath sounds: Normal breath sounds.  Musculoskeletal:        General: Normal range of motion.     Cervical back: Normal range of motion.  Skin:    General: Skin is warm and dry.     Coloration: Skin is not jaundiced.     Comments: 3cm cystic mass on left upper back, well-circumscribed with an overlying punctum, no erythema or induration, consistent with an epidermal inclusion cyst. 5cm soft subcutaneous well-circumscribed mass on the right upper back over the scapula, mobile, no overlying skin changes, consistent with a lipoma.  Neurological:     General: No focal deficit present.     Mental Status: She is alert and oriented to person, place, and time.  Psychiatric:        Mood and Affect: Mood normal.        Behavior: Behavior normal.        Thought Content: Thought content normal.          Assessment and Plan: Diagnoses and all orders for this visit:   Lipoma of torso   Epidermal inclusion cyst       55 yo female with an epidermal inclusion cyst and a lipoma, both on the upper back. I discussed with the patient and her husband that these are both benign lesions but if they are symptomatic they can be removed. Both lesions bother her and she would like to proceed with surgical excision. I discussed the procedure details and she agrees to proceed. This will be scheduled electively as outpatient surgery. She does not need to stop taking her aspirin for surgery and should continue this in the perioperative period.   Michaelle Birks, MD [facility],  Hepatobiliary and Pancreatic Surgery 01/13/21 5:10 PM

## 2021-01-22 DIAGNOSIS — M5416 Radiculopathy, lumbar region: Secondary | ICD-10-CM | POA: Diagnosis not present

## 2021-01-28 DIAGNOSIS — M6281 Muscle weakness (generalized): Secondary | ICD-10-CM | POA: Diagnosis not present

## 2021-01-28 DIAGNOSIS — M25512 Pain in left shoulder: Secondary | ICD-10-CM | POA: Diagnosis not present

## 2021-01-31 IMAGING — MG DIGITAL SCREENING BILAT W/ TOMO W/ CAD
8 series · 8 of 24 positions shown · non-contrast
Comparison: Previous exam(s).

CLINICAL DATA: Screening.

EXAM:
DIGITAL SCREENING BILATERAL MAMMOGRAM WITH TOMO AND CAD

[L CC synth-2D]
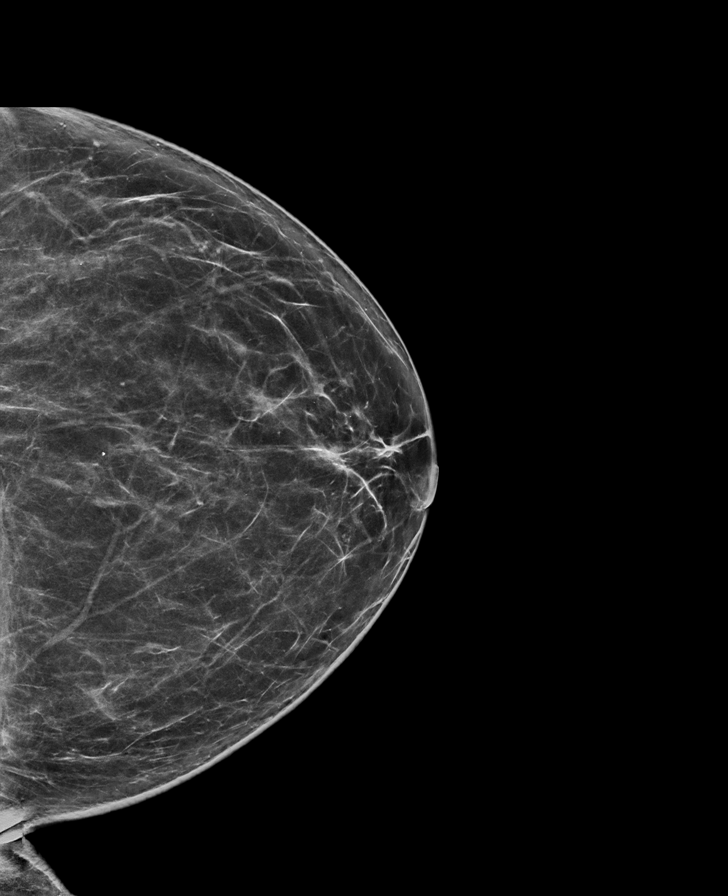

[L MLO synth-2D]
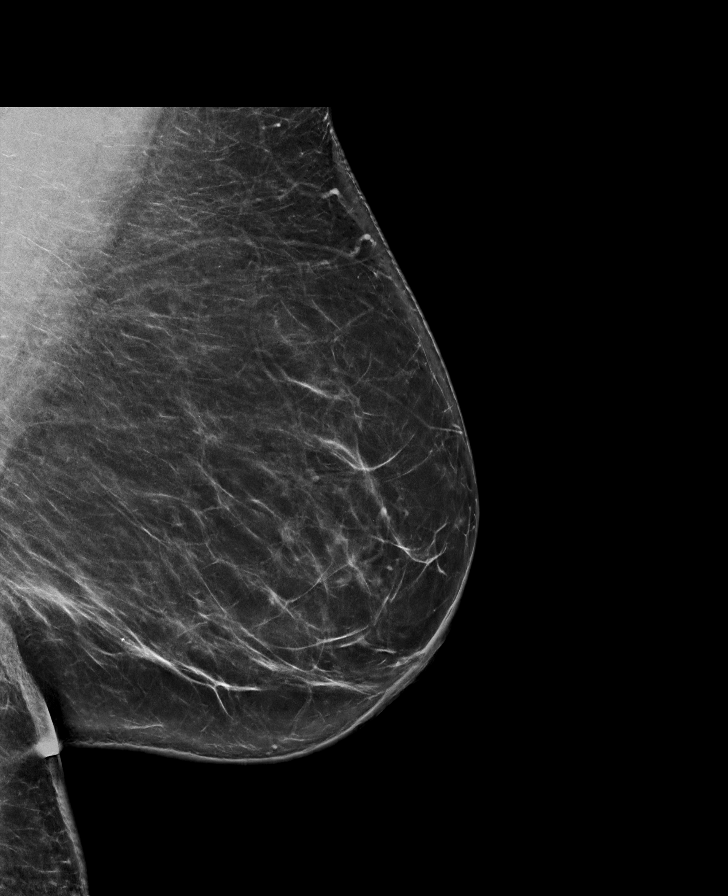

[R CC synth-2D]
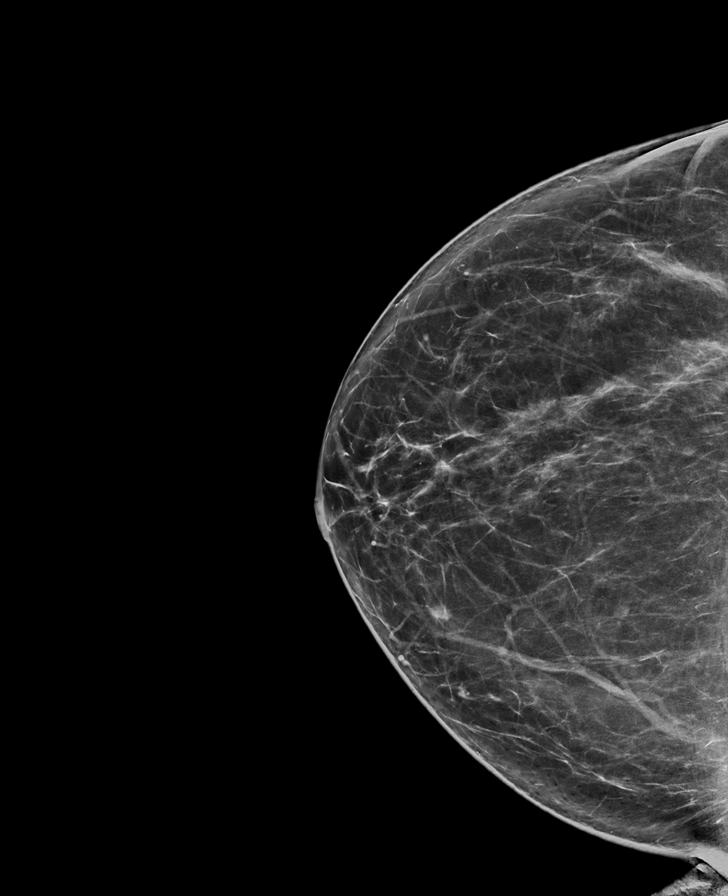

[R MLO synth-2D]
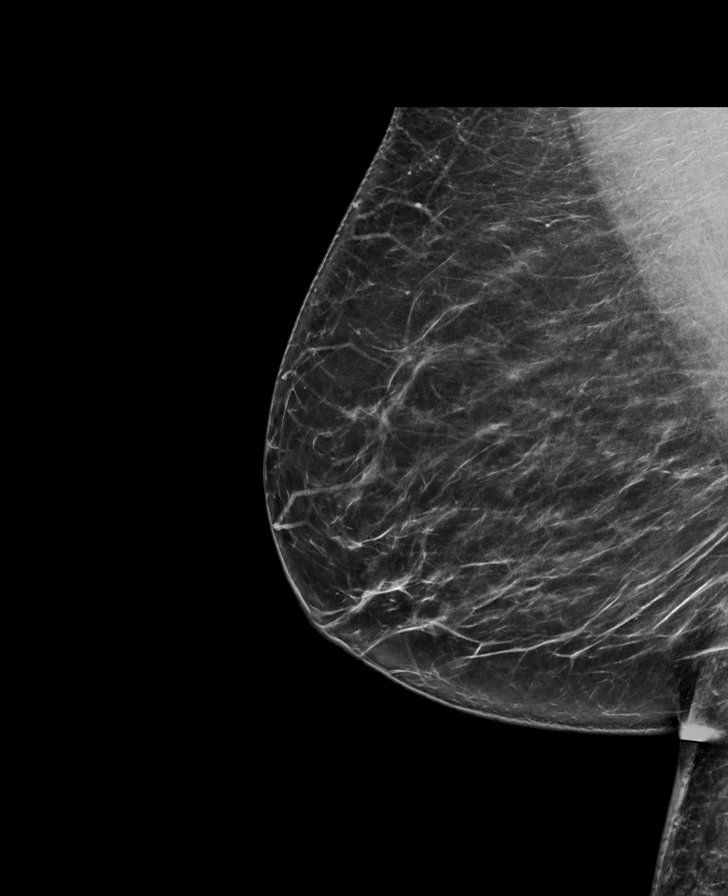

[L CC tomo · tomo slice 39/77.0]
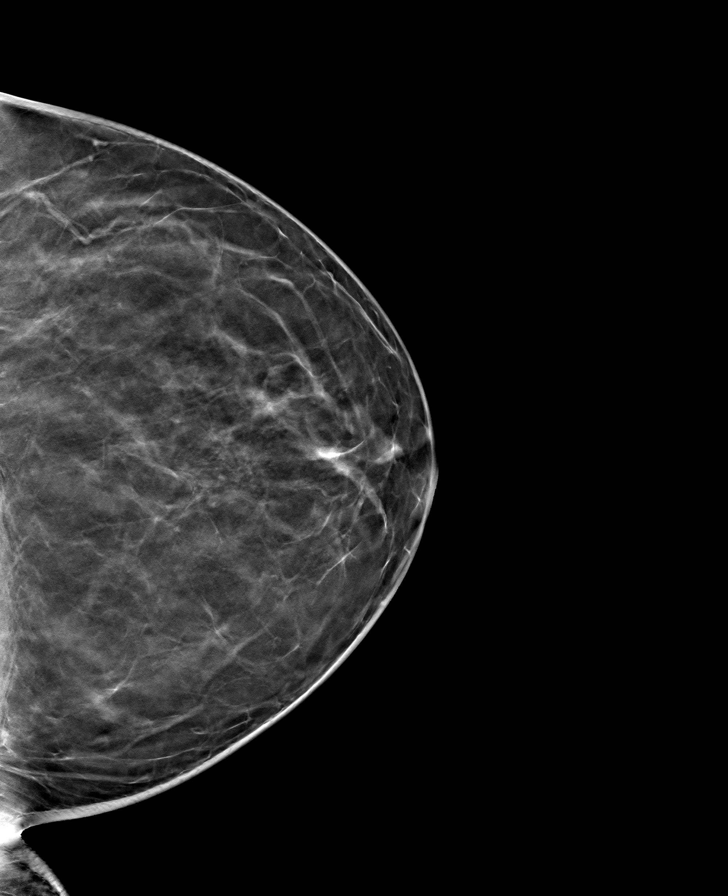

[R MLO tomo · tomo slice 42/83.0]
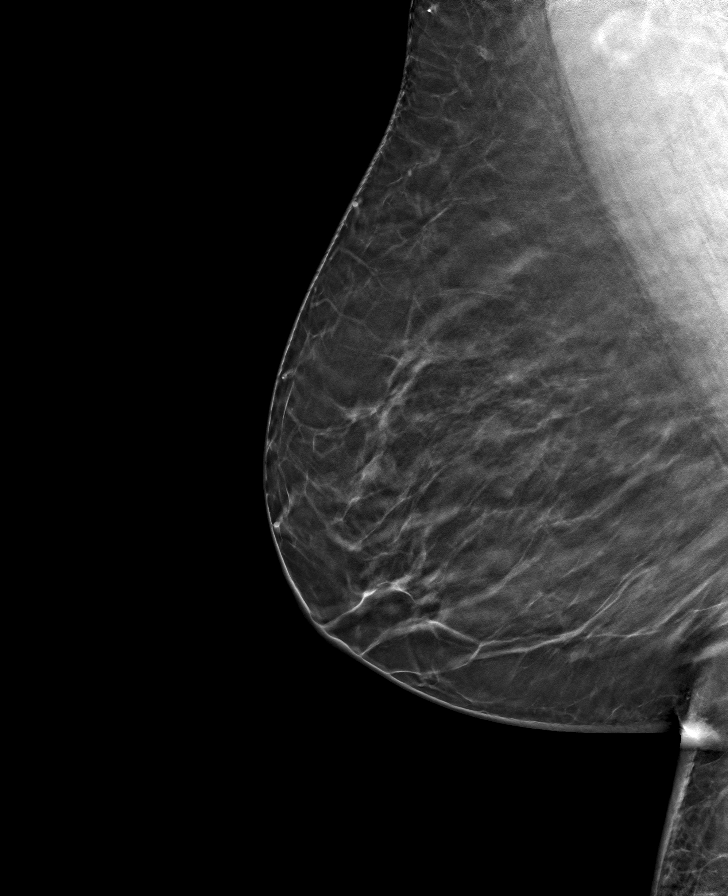

[L MLO tomo · tomo slice 46/91.0]
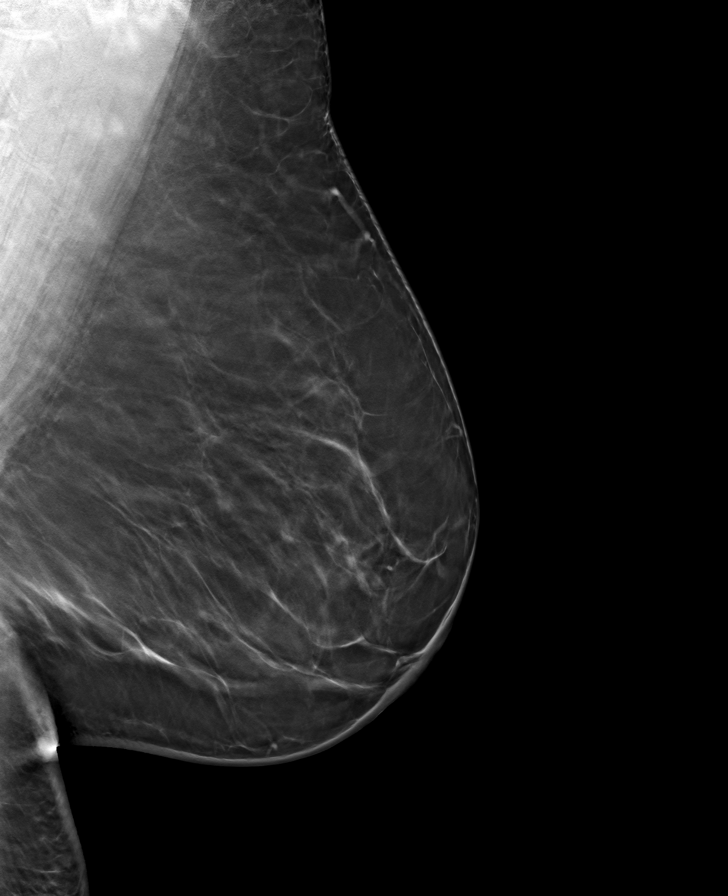

[R CC tomo · tomo slice 39/77.0]
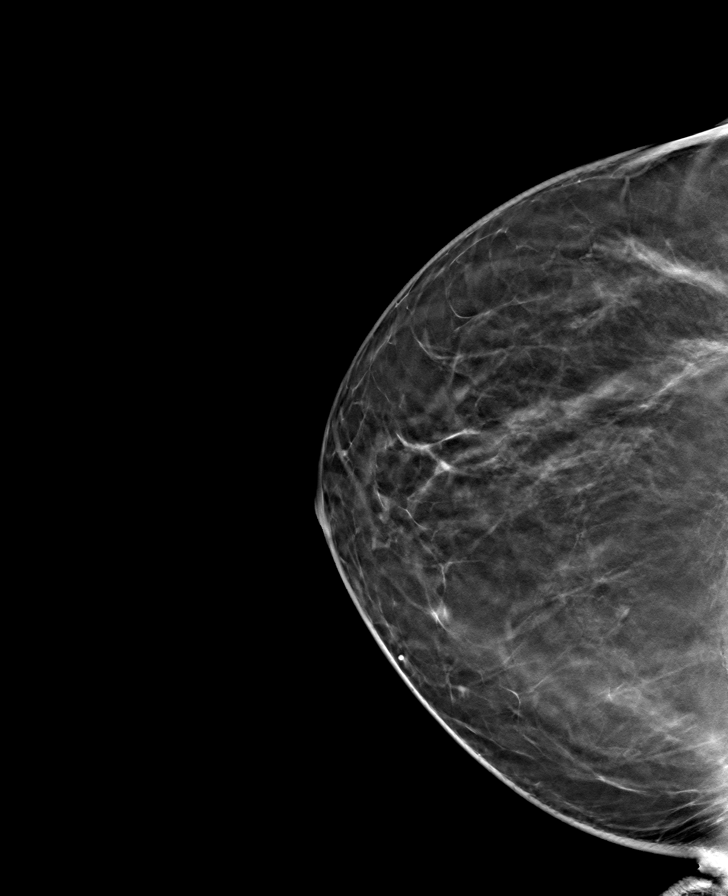

[8 of 24 positions shown; findings below may reference images not displayed]

ACR Breast Density Category b: There are scattered areas of
fibroglandular density.
FINDINGS: There are no findings suspicious for malignancy. Images were
processed with CAD.
IMPRESSION: No mammographic evidence of malignancy. A result letter of this
screening mammogram will be mailed directly to the patient.

RECOMMENDATION:
Screening mammogram in one year. (Code:[id number])

BI-RADS CATEGORY  1: Negative.

## 2021-02-11 ENCOUNTER — Ambulatory Visit: Payer: 59 | Admitting: Psychology

## 2021-02-11 ENCOUNTER — Encounter (HOSPITAL_BASED_OUTPATIENT_CLINIC_OR_DEPARTMENT_OTHER): Payer: Self-pay | Admitting: Surgery

## 2021-02-11 NOTE — Progress Notes (Addendum)
Spoke w/ via phone for pre-op interview---pt Lab needs dos----   I stat           Lab results------see below COVID test ----covid test  02-13-2021 at [place] due to covid exposure 02-10-2021 [facility] aware Arrive at -------800 am 02-14-2021 NPO after MN NO Solid Food.  Clear liquids from MN until---700 am Med rec completed Medications to take morning of surgery -----albuterol inhaler prn , atorvastatin amlodipine diabetic medication -----n/a  no nail polish to be worn day of surgery Patient instructed to bring photo id and insurance card day of surgery Patient aware to have Driver (ride ) / caregiver   spouse Ulice Dash will drop pt off due to  recent covid  for 24 hours after surgery  Patient Special Instructions -----none Pre-Op special Istructions -----none Patient verbalized understanding of instructions that were given at this phone interview. Patient denies shortness of breath, chest pain, fever, cough at this phone interview.   Pt taking last dose of 81 mg aspirin day before surgery 02-13-2021  Lov jill mcdaniel np cardiology 05-29-2020, pt states no chest pain since Lov dr turner osa 06-26-2020 epic Ekg 04-30-2020 epic Ct angio head 09-11-2020 epic Chest ct 05-28-2020 epic Echo 05-28-2020 epic Pcp carol webb following up on chest ct results from 09-11-2020 no action needed per pt Lov neurology 05-21-2020 epic  Pt to bring spinal cord stimulator remote dos

## 2021-02-12 ENCOUNTER — Encounter (HOSPITAL_BASED_OUTPATIENT_CLINIC_OR_DEPARTMENT_OTHER): Payer: Self-pay | Admitting: Surgery

## 2021-02-12 ENCOUNTER — Other Ambulatory Visit: Payer: Self-pay

## 2021-02-12 DIAGNOSIS — M549 Dorsalgia, unspecified: Secondary | ICD-10-CM

## 2021-02-12 DIAGNOSIS — Z973 Presence of spectacles and contact lenses: Secondary | ICD-10-CM

## 2021-02-12 HISTORY — DX: Dorsalgia, unspecified: M54.9

## 2021-02-12 HISTORY — DX: Presence of spectacles and contact lenses: Z97.3

## 2021-02-13 ENCOUNTER — Ambulatory Visit: Payer: 59 | Admitting: Psychology

## 2021-02-13 LAB — RESP PANEL BY RT-PCR (FLU A&B, COVID) ARPGX2
Influenza A by PCR: NEGATIVE
Influenza B by PCR: NEGATIVE
SARS Coronavirus 2 by RT PCR: POSITIVE — AB

## 2021-02-28 ENCOUNTER — Encounter (HOSPITAL_BASED_OUTPATIENT_CLINIC_OR_DEPARTMENT_OTHER): Payer: Self-pay | Admitting: Surgery

## 2021-02-28 ENCOUNTER — Other Ambulatory Visit: Payer: Self-pay

## 2021-02-28 NOTE — Progress Notes (Signed)
Spoke w/ via phone for pre-op interview---pt Lab needs dos----   I stat           Lab results------see below COVID test ----POSITIVE COVID TEST 02-13-2021 Epic/ NO RETEST NEEDED Arrive at ---630 AM 03-05-2021 NPO after MN NO Solid Food.  Clear liquids from MN until---530 am Med rec completed Medications to take morning of surgery -----albuterol inhaler prn , atorvastatin amlodipine diabetic medication -----n/a  no nail polish to be worn day of surgery Patient instructed to bring photo id and insurance card day of surgery Patient aware to have Driver (ride ) / caregiver   spouse Ulice Dash will  stay  for 24 hours after surgery  Patient Special Instructions -----none Pre-Op special Istructions -----none Patient verbalized understanding of instructions that were given at this phone interview. Patient denies shortness of breath, chest pain, fever, cough at this phone interview.   Pt taking last dose of 81 mg aspirin day before surgery 03-04-2021  Lov jill mcdaniel np cardiology 05-29-2020, pt states no chest pain since Lov dr turner osa 06-26-2020 epic Ekg 04-30-2020 epic Ct angio head 09-11-2020 epic Chest ct 05-28-2020 epic Echo 05-28-2020 epic Pcp carol webb following up on chest ct results from 09-11-2020 no action needed per pt Lov neurology 05-21-2020 epic  Pt to bring spinal cord stimulator remote dos

## 2021-03-04 NOTE — Anesthesia Preprocedure Evaluation (Addendum)
Anesthesia Evaluation  Patient identified by MRN, date of birth, ID band Patient awake    Reviewed: Allergy & Precautions, NPO status , Patient's Chart, lab work & pertinent test results  Airway Mallampati: II  TM Distance: >3 FB Neck ROM: Full    Dental  (+) Dental Advisory Given   Pulmonary asthma , sleep apnea , Patient abstained from smoking., former smoker,    breath sounds clear to auscultation       Cardiovascular hypertension, Pt. on medications  Rhythm:Regular Rate:Normal     Neuro/Psych  Neuromuscular disease    GI/Hepatic negative GI ROS, Neg liver ROS,   Endo/Other  diabetes, Type 2Morbid obesity  Renal/GU negative Renal ROS     Musculoskeletal   Abdominal   Peds  Hematology negative hematology ROS (+)   Anesthesia Other Findings   Reproductive/Obstetrics                            Anesthesia Physical Anesthesia Plan  ASA: 3  Anesthesia Plan: General   Post-op Pain Management:    Induction: Intravenous  PONV Risk Score and Plan: 3 and Dexamethasone, Ondansetron, Midazolam and Treatment may vary due to age or medical condition  Airway Management Planned: Oral ETT  Additional Equipment:   Intra-op Plan:   Post-operative Plan: Extubation in OR  Informed Consent: I have reviewed the patients History and Physical, chart, labs and discussed the procedure including the risks, benefits and alternatives for the proposed anesthesia with the patient or authorized representative who has indicated his/her understanding and acceptance.     Dental advisory given  Plan Discussed with: CRNA  Anesthesia Plan Comments:        Anesthesia Quick Evaluation

## 2021-03-05 ENCOUNTER — Encounter (HOSPITAL_BASED_OUTPATIENT_CLINIC_OR_DEPARTMENT_OTHER): Payer: Self-pay | Admitting: Surgery

## 2021-03-05 ENCOUNTER — Encounter (HOSPITAL_BASED_OUTPATIENT_CLINIC_OR_DEPARTMENT_OTHER)
Admission: RE | Disposition: A | Discharge status: Home / Self Care | Payer: Self-pay | Source: Home / Self Care | Attending: Surgery

## 2021-03-05 ENCOUNTER — Ambulatory Visit (HOSPITAL_BASED_OUTPATIENT_CLINIC_OR_DEPARTMENT_OTHER)
Admission: RE | Admit: 2021-03-05 | Discharge: 2021-03-05 | Disposition: A | Discharge status: Home / Self Care | Payer: 59 | Attending: Surgery | Admitting: Surgery

## 2021-03-05 ENCOUNTER — Ambulatory Visit (HOSPITAL_BASED_OUTPATIENT_CLINIC_OR_DEPARTMENT_OTHER): Payer: 59 | Admitting: Anesthesiology

## 2021-03-05 DIAGNOSIS — D1722 Benign lipomatous neoplasm of skin and subcutaneous tissue of left arm: Secondary | ICD-10-CM | POA: Diagnosis not present

## 2021-03-05 DIAGNOSIS — Z87891 Personal history of nicotine dependence: Secondary | ICD-10-CM | POA: Insufficient documentation

## 2021-03-05 DIAGNOSIS — Z79899 Other long term (current) drug therapy: Secondary | ICD-10-CM | POA: Insufficient documentation

## 2021-03-05 DIAGNOSIS — Z8616 Personal history of COVID-19: Secondary | ICD-10-CM | POA: Diagnosis not present

## 2021-03-05 DIAGNOSIS — L723 Sebaceous cyst: Secondary | ICD-10-CM | POA: Diagnosis not present

## 2021-03-05 DIAGNOSIS — L720 Epidermal cyst: Secondary | ICD-10-CM | POA: Diagnosis not present

## 2021-03-05 DIAGNOSIS — E7800 Pure hypercholesterolemia, unspecified: Secondary | ICD-10-CM | POA: Diagnosis not present

## 2021-03-05 DIAGNOSIS — Z7982 Long term (current) use of aspirin: Secondary | ICD-10-CM | POA: Insufficient documentation

## 2021-03-05 DIAGNOSIS — D171 Benign lipomatous neoplasm of skin and subcutaneous tissue of trunk: Secondary | ICD-10-CM | POA: Diagnosis not present

## 2021-03-05 DIAGNOSIS — I10 Essential (primary) hypertension: Secondary | ICD-10-CM | POA: Diagnosis not present

## 2021-03-05 DIAGNOSIS — Z91040 Latex allergy status: Secondary | ICD-10-CM | POA: Insufficient documentation

## 2021-03-05 DIAGNOSIS — Z7984 Long term (current) use of oral hypoglycemic drugs: Secondary | ICD-10-CM | POA: Diagnosis not present

## 2021-03-05 DIAGNOSIS — G4733 Obstructive sleep apnea (adult) (pediatric): Secondary | ICD-10-CM | POA: Diagnosis not present

## 2021-03-05 HISTORY — PX: LIPOMA EXCISION: SHX5283

## 2021-03-05 HISTORY — DX: Concussion with loss of consciousness of unspecified duration, initial encounter: S06.0X9A

## 2021-03-05 HISTORY — DX: Concussion with loss of consciousness status unknown, initial encounter: S06.0XAA

## 2021-03-05 HISTORY — DX: Benign lipomatous neoplasm, unspecified: D17.9

## 2021-03-05 HISTORY — DX: Rash and other nonspecific skin eruption: R21

## 2021-03-05 LAB — POCT I-STAT, CHEM 8
BUN: 9 mg/dL (ref 6–20)
Calcium, Ion: 1.3 mmol/L (ref 1.15–1.40)
Chloride: 104 mmol/L (ref 98–111)
Creatinine, Ser: 0.5 mg/dL (ref 0.44–1.00)
Glucose, Bld: 126 mg/dL — ABNORMAL HIGH (ref 70–99)
HCT: 37 % (ref 36.0–46.0)
Hemoglobin: 12.6 g/dL (ref 12.0–15.0)
Potassium: 3.7 mmol/L (ref 3.5–5.1)
Sodium: 143 mmol/L (ref 135–145)
TCO2: 25 mmol/L (ref 22–32)

## 2021-03-05 LAB — GLUCOSE, CAPILLARY: Glucose-Capillary: 116 mg/dL — ABNORMAL HIGH (ref 70–99)

## 2021-03-05 SURGERY — EXCISION LIPOMA
Anesthesia: General | Site: Back

## 2021-03-05 MED ORDER — ROCURONIUM BROMIDE 10 MG/ML (PF) SYRINGE
PREFILLED_SYRINGE | INTRAVENOUS | Status: AC
  Filled 2021-03-05: qty 10

## 2021-03-05 MED ORDER — MIDAZOLAM HCL 2 MG/2ML IJ SOLN
INTRAMUSCULAR | Status: AC
  Filled 2021-03-05: qty 2

## 2021-03-05 MED ORDER — DEXAMETHASONE SODIUM PHOSPHATE 10 MG/ML IJ SOLN
INTRAMUSCULAR | Status: DC | PRN
  Administered 2021-03-05: 10 mg via INTRAVENOUS

## 2021-03-05 MED ORDER — LACTATED RINGERS IV SOLN: INTRAVENOUS | Status: DC

## 2021-03-05 MED ORDER — CEFAZOLIN SODIUM-DEXTROSE 2-4 GM/100ML-% IV SOLN
INTRAVENOUS | Status: AC
  Filled 2021-03-05: qty 100

## 2021-03-05 MED ORDER — PROPOFOL 10 MG/ML IV BOLUS
INTRAVENOUS | Status: AC
  Filled 2021-03-05: qty 20

## 2021-03-05 MED ORDER — PROPOFOL 10 MG/ML IV BOLUS
INTRAVENOUS | Status: DC | PRN
  Administered 2021-03-05: 200 mg via INTRAVENOUS

## 2021-03-05 MED ORDER — KETOROLAC TROMETHAMINE 30 MG/ML IJ SOLN
INTRAMUSCULAR | Status: DC | PRN
  Administered 2021-03-05: 30 mg via INTRAVENOUS

## 2021-03-05 MED ORDER — FENTANYL CITRATE (PF) 100 MCG/2ML IJ SOLN
INTRAMUSCULAR | Status: DC | PRN
  Administered 2021-03-05 (×2): 50 ug via INTRAVENOUS

## 2021-03-05 MED ORDER — ACETAMINOPHEN 500 MG PO TABS
1000.0000 mg | ORAL_TABLET | Freq: Once | ORAL | Status: AC
  Administered 2021-03-05: 1000 mg via ORAL

## 2021-03-05 MED ORDER — TRAMADOL HCL 50 MG PO TABS: 50.0000 mg | ORAL_TABLET | Freq: Four times a day (QID) | ORAL | 0 refills | Status: DC | PRN

## 2021-03-05 MED ORDER — KETOROLAC TROMETHAMINE 30 MG/ML IJ SOLN
INTRAMUSCULAR | Status: AC
  Filled 2021-03-05: qty 1

## 2021-03-05 MED ORDER — SUGAMMADEX SODIUM 200 MG/2ML IV SOLN
INTRAVENOUS | Status: DC | PRN
  Administered 2021-03-05: 200 mg via INTRAVENOUS

## 2021-03-05 MED ORDER — ROCURONIUM BROMIDE 100 MG/10ML IV SOLN
INTRAVENOUS | Status: DC | PRN
  Administered 2021-03-05: 50 mg via INTRAVENOUS

## 2021-03-05 MED ORDER — LIDOCAINE 2% (20 MG/ML) 5 ML SYRINGE
INTRAMUSCULAR | Status: DC | PRN
  Administered 2021-03-05: 80 mg via INTRAVENOUS

## 2021-03-05 MED ORDER — ONDANSETRON HCL 4 MG/2ML IJ SOLN
INTRAMUSCULAR | Status: DC | PRN
  Administered 2021-03-05: 4 mg via INTRAVENOUS

## 2021-03-05 MED ORDER — ACETAMINOPHEN 500 MG PO TABS
ORAL_TABLET | ORAL | Status: AC
  Filled 2021-03-05: qty 2

## 2021-03-05 MED ORDER — FENTANYL CITRATE (PF) 100 MCG/2ML IJ SOLN
INTRAMUSCULAR | Status: AC
  Filled 2021-03-05: qty 2

## 2021-03-05 MED ORDER — LIDOCAINE HCL (PF) 2 % IJ SOLN
INTRAMUSCULAR | Status: AC
  Filled 2021-03-05: qty 5

## 2021-03-05 MED ORDER — FENTANYL CITRATE (PF) 100 MCG/2ML IJ SOLN: 25.0000 ug | INTRAMUSCULAR | Status: DC | PRN

## 2021-03-05 MED ORDER — AMISULPRIDE (ANTIEMETIC) 5 MG/2ML IV SOLN: 10.0000 mg | Freq: Once | INTRAVENOUS | Status: DC | PRN

## 2021-03-05 MED ORDER — BUPIVACAINE-EPINEPHRINE 0.25% -1:200000 IJ SOLN
INTRAMUSCULAR | Status: DC | PRN
  Administered 2021-03-05: 30 mL

## 2021-03-05 MED ORDER — MIDAZOLAM HCL 5 MG/5ML IJ SOLN
INTRAMUSCULAR | Status: DC | PRN
  Administered 2021-03-05: 2 mg via INTRAVENOUS

## 2021-03-05 MED ORDER — CEFAZOLIN SODIUM-DEXTROSE 2-4 GM/100ML-% IV SOLN
2.0000 g | INTRAVENOUS | Status: AC
  Administered 2021-03-05: 2 g via INTRAVENOUS

## 2021-03-05 SURGICAL SUPPLY — 48 items
ADH SKN CLS APL DERMABOND .7 (GAUZE/BANDAGES/DRESSINGS) ×2
APL PRP STRL LF DISP 70% ISPRP (MISCELLANEOUS) ×1
BLADE SURG 15 STRL LF DISP TIS (BLADE) ×1 IMPLANT
BLADE SURG 15 STRL SS (BLADE) ×2
CHLORAPREP W/TINT 26 (MISCELLANEOUS) ×2 IMPLANT
COVER BACK TABLE 60X90IN (DRAPES) ×2 IMPLANT
COVER MAYO STAND STRL (DRAPES) ×2 IMPLANT
DERMABOND ADVANCED (GAUZE/BANDAGES/DRESSINGS) ×2
DERMABOND ADVANCED .7 DNX12 (GAUZE/BANDAGES/DRESSINGS) ×2 IMPLANT
DRAPE LAPAROTOMY 100X72 PEDS (DRAPES) IMPLANT
DRAPE LAPAROTOMY TRNSV 102X78 (DRAPES) IMPLANT
DRAPE UTILITY XL STRL (DRAPES) ×2 IMPLANT
DRSG PAD ABDOMINAL 8X10 ST (GAUZE/BANDAGES/DRESSINGS) IMPLANT
ELECT REM PT RETURN 9FT ADLT (ELECTROSURGICAL) ×2
ELECTRODE REM PT RTRN 9FT ADLT (ELECTROSURGICAL) ×1 IMPLANT
GAUZE 4X4 16PLY ~~LOC~~+RFID DBL (SPONGE) ×2 IMPLANT
GAUZE SPONGE 4X4 12PLY STRL (GAUZE/BANDAGES/DRESSINGS) ×2 IMPLANT
GLOVE SS PI  5.5 STRL (GLOVE) ×2
GLOVE SS PI 5.5 STRL (GLOVE) ×1 IMPLANT
GLOVE SURG ENC MOIS LTX SZ6 (GLOVE) ×2 IMPLANT
GLOVE SURG UNDER POLY LF SZ6 (GLOVE) ×2 IMPLANT
GOWN STRL REUS W/TWL LRG LVL3 (GOWN DISPOSABLE) ×2 IMPLANT
KIT SIGMOIDOSCOPE (SET/KITS/TRAYS/PACK) IMPLANT
KIT TURNOVER CYSTO (KITS) ×2 IMPLANT
NEEDLE HYPO 22GX1.5 SAFETY (NEEDLE) IMPLANT
NEEDLE HYPO 25X1 1.5 SAFETY (NEEDLE) IMPLANT
PACK BASIN DAY SURGERY FS (CUSTOM PROCEDURE TRAY) ×2 IMPLANT
PANTS MESH DISP LRG (UNDERPADS AND DIAPERS) IMPLANT
PANTS MESH DISPOSABLE L (UNDERPADS AND DIAPERS)
PENCIL SMOKE EVACUATOR (MISCELLANEOUS) ×2 IMPLANT
SPONGE T-LAP 18X18 ~~LOC~~+RFID (SPONGE) IMPLANT
SPONGE T-LAP 4X18 ~~LOC~~+RFID (SPONGE) IMPLANT
STAPLER VISISTAT 35W (STAPLE) IMPLANT
SUCTION FRAZIER HANDLE 10FR (MISCELLANEOUS)
SUCTION TUBE FRAZIER 10FR DISP (MISCELLANEOUS) IMPLANT
SUT CHROMIC 3 0 SH 27 (SUTURE) IMPLANT
SUT MNCRL AB 4-0 PS2 18 (SUTURE) ×2 IMPLANT
SUT VIC AB 2-0 SH 27 (SUTURE)
SUT VIC AB 2-0 SH 27XBRD (SUTURE) IMPLANT
SUT VIC AB 3-0 SH 18 (SUTURE) IMPLANT
SUT VIC AB 3-0 SH 27 (SUTURE) ×2
SUT VIC AB 3-0 SH 27X BRD (SUTURE) ×1 IMPLANT
SYR BULB IRRIG 60ML STRL (SYRINGE) ×2 IMPLANT
SYR CONTROL 10ML LL (SYRINGE) ×2 IMPLANT
TOWEL OR 17X26 10 PK STRL BLUE (TOWEL DISPOSABLE) ×2 IMPLANT
TRAY DSU PREP LF (CUSTOM PROCEDURE TRAY) IMPLANT
TUBE CONNECTING 12X1/4 (SUCTIONS) IMPLANT
YANKAUER SUCT BULB TIP NO VENT (SUCTIONS) ×2 IMPLANT

## 2021-03-05 NOTE — Op Note (Signed)
Date: 03/05/21  Patient: [name] MRN: [id number]  Preoperative Diagnosis: Lipoma of right upper back, sebaceous cyst of left upper back Postoperative Diagnosis: Same  Procedure: Excision of right upper back lipoma Excision of left upper back sebaceous cyst  Surgeon: Michaelle Birks, MD  EBL: Minimal  Anesthesia: General  Specimens:  Right upper back mass Left upper back cyst  Indications: [name] is a 55 year old female who presented with 2 lesions on the upper back.  She had a cystic lesion on the left upper back for many years that is intermittently drained and cause discomfort.  She also noticed a mass on the right posterior shoulder that was consistent with a lipoma.  After a discussion of the risks and benefits of surgery, she elected to proceed with excision of both lesions.  Findings: 4 x 4 centimeter fatty subcutaneous mass over the right scapula, consistent with a benign lipoma.  2 cm cystic subcutaneous mass on the left upper back consistent with a sebaceous cyst.  Procedure details: Informed consent was obtained in the preoperative area prior to the procedure. The patient was brought to the operating room, general anesthesia was induced and the patient was placed in the prone position. Perioperative antibiotics were administered per SCIP guidelines. The back was prepped and draped in the usual sterile fashion. A pre-procedure timeout was taken verifying patient identity, surgical site and procedure to be performed.  An incision was made over the palpable mass overlying the right scapula.  The subcutaneous tissue was divided with cautery and a lobulated fatty mass was encountered.  The mass did not seem to be encapsulated.  The mass was dissected out from the surrounding tissues circumferentially using cautery.  The mass was completely excised and measured approximately 4 x 4 cm, and was sent for routine pathology.  The wound was irrigated and appeared  hemostatic.  The skin was reapproximated with a layer of simple interrupted deep dermal 3-0 Vicryl sutures.  The skin was closed with a running subcuticular 4-0 Monocryl suture and Dermabond was applied.  Next the lesion on the left side was approached.  There was a visible pore overlying the subcutaneous nodule.  An elliptical skin incision was made around the pore and the subcutaneous tissue was divided with cautery.  The cyst was encountered and was approximately 2 cm in diameter.  The cyst was dissected out from the subcutaneous tissue, taking care to remove the entire wall and contents of the cyst.  The cyst contained thick white material, consistent with a sebaceous cyst.  The specimen was excised and sent for routine pathology.  The wound was irrigated and no visible cyst wall remained.  Hemostasis was achieved using cautery.  The skin was reapproximated with a layer of simple interrupted deep dermal 3-0 Vicryl sutures, and closed with a running subcuticular 4-0 Monocryl suture.  Dermabond was applied.  Patient tolerated the procedure well with no apparent complications.  All counts were correct x2 at the end of the procedure. The patient was extubated and taken to PACU in stable condition.  Michaelle Birks, MD 03/05/21 9:30 AM

## 2021-03-05 NOTE — Anesthesia Procedure Notes (Signed)
Procedure Name: Intubation Date/Time: 03/05/2021 8:29 AM Performed by: Gwyndolyn Saxon, CRNA Pre-anesthesia Checklist: Patient identified, Emergency Drugs available, Suction available and Patient being monitored Patient Re-evaluated:Patient Re-evaluated prior to induction Oxygen Delivery Method: Circle system utilized Preoxygenation: Pre-oxygenation with 100% oxygen Induction Type: IV induction Ventilation: Mask ventilation without difficulty Laryngoscope Size: Miller and 2 Grade View: Grade III Tube type: Oral Tube size: 7.0 mm Number of attempts: 1 Airway Equipment and Method: Patient positioned with wedge pillow and Stylet Placement Confirmation: positive ETCO2 and breath sounds checked- equal and bilateral Secured at: 21 cm Tube secured with: Tape Dental Injury: Teeth and Oropharynx as per pre-operative assessment  Comments: Pt with adequate mouth opening in short stay; more limited opening after induction

## 2021-03-05 NOTE — H&P (Signed)
[name] is an 55 y.o. female.   Chief Complaint: cysts on back HPI: [name] is a 55 yo female who was referred for evaluation of a cyst and lipoma, both on the back. The cyst is on her left upper back and has been present for many years. It sometimes stings and burns, and she has been able to express thick fluid from it. It always reaccumulates after this. She also has a lump on her right upper back that she says hurts when touched. It does not drain anything. She was evaluated by her dermatologist and referred here. Both lesions are bothering her and she would like to have them removed. She was scheduled for surgery in August but preoperative COVID test was positive (she did not have symptoms). She was rescheduled for today. She is having itching from the sebaceous cyst on the left but has not had any recent drainage.  Past Medical History:  Diagnosis Date   Anxiety    per notes from Dr Rolena Infante (orthopedics)   Asthma    Back pain 02/12/2021   L 4 to L 5 sees dr Nelva Bush starting injections soon   Concussion    2005 and 2006 and oct 2021 due to [place] 02/2020   cough fatiguelow grade temp x 1 weeks took monoclonal antibodies   COVID 02/13/2021   ASYMPTOMATIC   Depression    Diabetes mellitus without complication ([place])    Dilated aortic root ([place])    76mm by CTA 07/2018   Fatty liver    H/O cluster headache    High cholesterol    Hypertension    Lipoma    Morbid obesity with BMI of 40.0-44.9, adult ([place])    OSA (obstructive sleep apnea)    mild with AHI 11/hr now on CPAP at 18cm H2O   Rash    left thigh healing   Swelling    legs/feet/hands   Wears glasses 02/12/2021    Past Surgical History:  Procedure Laterality Date   ABDOMINAL HYSTERECTOMY  2006   partial   BACK SURGERY  2006   x2 lower L 4 to L 5   CARPAL TUNNEL RELEASE     right   [place] and 2005   KNEE SURGERY Left 06/15/1985   arthroscopic   SHOULDER SURGERY Left     2020 and 2021 labrum and rotator cuff repair   SHOULDER SURGERY Right 2007   SPINAL CORD STIMULATOR IMPLANT  2012   battery replacement 2017 and 2021    Family History  Problem Relation Age of Onset   Hypertension Mother    Diabetes Mother    Lymphoma Mother    Breast cancer Mother    Arthritis Mother    Hypertension Father    Diabetes Father    Heart disease Father    Prostate cancer Father    Hypertension Sister    Diabetes Sister    Hypertension Brother    Diabetes Brother    Hypertension Maternal Grandmother    Stomach cancer Maternal Grandmother    Hypertension Maternal Grandfather    Hypertension Paternal Grandmother    Breast cancer Paternal Grandmother    Hypertension Paternal Grandfather    Heart disease Paternal Grandfather    Hypertension Paternal Uncle    Hypertension Paternal Aunt    Hypertension Maternal Aunt    Hypertension Maternal Uncle    Social History:  reports that she quit smoking about 18 years ago. Her  smoking use included cigarettes. She has a 10.00 pack-year smoking history. She has never used smokeless tobacco. She reports current alcohol use. She reports that she does not currently use drugs.  Allergies:  Allergies  Allergen Reactions   Latex     Latex tape causes rash    Medications Prior to Admission  Medication Sig Dispense Refill   amLODipine (NORVASC) 10 MG tablet Take 10 mg by mouth every morning.      aspirin EC 81 MG tablet Take 1 tablet (81 mg total) by mouth daily.     atorvastatin (LIPITOR) 10 MG tablet Take 1 tablet (10 mg total) by mouth daily. 90 tablet 3   Calcium Carbonate-Vitamin D (CALCIUM 600 + D PO) Take 2 tablets by mouth every morning.      glimepiride (AMARYL) 2 MG tablet Take 1 tablet (2 mg total) by mouth daily before breakfast. 90 tablet 3   metFORMIN (GLUCOPHAGE-XR) 500 MG 24 hr tablet Take 2,000 mg by mouth daily with supper.     metoprolol (TOPROL-XL) 200 MG 24 hr tablet Take 200 mg by mouth every evening.       triamcinolone cream (KENALOG) 0.1 % as needed (flare ups).     Albuterol Sulfate (PROAIR RESPICLICK) 786 (90 Base) MCG/ACT AEPB INHALE 1 PUFF BY MOUTH EVERY 4 HOURS AS NEEDED     carboxymethylcellulose (REFRESH PLUS) 0.5 % SOLN Place 1 drop into both eyes 3 (three) times daily as needed (dry eyes).     Dulaglutide (TRULICITY) 1.5 VE/7.2CN SOPN Inject 1.5 mg into the skin once a week. (Patient taking differently: Inject 1.5 mg into the skin once a week. Thursday am) 2 mL 11   glucose blood (ONETOUCH VERIO) test strip Use as instructed to test blood sugar 2 times daily E11.65 100 each 12    No results found for this or any previous visit (from the past 48 hour(s)). No results found.  Review of Systems  Blood pressure (!) 156/101, pulse 63, temperature 98.6 F (37 C), temperature source Oral, resp. rate 17, height 5\' 6"  (1.676 m), weight 102.6 kg, SpO2 99 %. Physical Exam Vitals reviewed.  Constitutional:      General: She is not in acute distress.    Appearance: Normal appearance.  HENT:     Head: Normocephalic and atraumatic.  Eyes:     General: No scleral icterus.    Conjunctiva/sclera: Conjunctivae normal.  Pulmonary:     Effort: Pulmonary effort is normal. No respiratory distress.  Musculoskeletal:        General: Normal range of motion.     Cervical back: Normal range of motion.  Skin:    General: Skin is warm and dry.     Comments: 2cm soft tissue nodule on left upper back with an overlying pore, consistent with a sebaceous cyst. 5cm soft tissue mass on right upper back over the scapula, well-circumscribed and mobile, consistent with a lipoma.   Neurological:     General: No focal deficit present.     Mental Status: She is alert and oriented to person, place, and time.  Psychiatric:        Mood and Affect: Mood normal.        Behavior: Behavior normal.        Thought Content: Thought content normal.     Assessment/Plan 55 yo female with a sebaceous cyst and lipoma on  back, who desires surgical excision. Procedure details discussed, informed consent obtained. Proceed to OR for excision, plan for  discharge home from PACU.  Dwan Bolt, MD 03/05/2021, 7:34 AM

## 2021-03-05 NOTE — Transfer of Care (Signed)
Immediate Anesthesia Transfer of Care Note  Patient: [name]  Procedure(s) Performed: EXCISION LIPOMA AND EPIDERMAL CYST BACK (Back)  Patient Location: PACU  Anesthesia Type:General  Level of Consciousness: drowsy and patient cooperative  Airway & Oxygen Therapy: Patient Spontanous Breathing and Patient connected to face mask oxygen  Post-op Assessment: Report given to RN and Post -op Vital signs reviewed and stable  Post vital signs: Reviewed and stable  Last Vitals:  Vitals Value Taken Time  BP    Temp    Pulse    Resp    SpO2      Last Pain:  Vitals:   03/05/21 0721  TempSrc: Oral  PainSc: 4       Patients Stated Pain Goal: 2 (94/85/46 2703)  Complications: No notable events documented.

## 2021-03-05 NOTE — Discharge Instructions (Addendum)
[facility] [place] [facility] DISCHARGE INSTRUCTIONS  Activity Ok to shower in 24 hours, but do not bathe or submerge incisions underwater. Do not drive while taking narcotic pain medication.  Wound Care Your incisions are covered with skin glue called Dermabond. This will peel off on its own over time. You may shower and allow warm soapy water to run over your incisions. Gently pat dry. Do not submerge your incision underwater. Monitor your incision for any new redness, tenderness, or drainage.  When to Call us: Fever greater than 100.5 New redness, drainage, or swelling at incision site Severe pain, nausea, or vomiting  Follow-up You have an appointment scheduled with Dr. Zenia Resides on March 25, 2021 at 9:30am. This will be at the [facility] office at [address] [place], [place], [place]. Please arrive at least 15 minutes prior to your scheduled appointment time.  For questions or concerns, please call the office at [phone].   Post Anesthesia Home Care Instructions  Activity: Get plenty of rest for the remainder of the day. A responsible individual must stay with you for 24 hours following the procedure.  For the next 24 hours, DO NOT: -Drive a car -Paediatric nurse -Drink alcoholic beverages -Take any medication unless instructed by your physician -Make any legal decisions or sign important papers.  Meals: Start with liquid foods such as gelatin or soup. Progress to regular foods as tolerated. Avoid greasy, spicy, heavy foods. If nausea and/or vomiting occur, drink only clear liquids until the nausea and/or vomiting subsides. Call your physician if vomiting continues.  Special Instructions/Symptoms: Your throat may feel dry or sore from the anesthesia or the breathing tube placed in your throat during surgery. If this causes discomfort, gargle with warm salt water. The discomfort should disappear within 24 hours.   **No acetaminophen/Tylenol until  1:30pm today if needed.  **No ibuprofen, Advil, Aleve, Motrin, ketorolac, meloxicam, or naproxen until after 3:30pm today if needed.

## 2021-03-06 ENCOUNTER — Encounter (HOSPITAL_BASED_OUTPATIENT_CLINIC_OR_DEPARTMENT_OTHER): Payer: Self-pay | Admitting: Surgery

## 2021-03-06 LAB — SURGICAL PATHOLOGY

## 2021-03-07 NOTE — Anesthesia Postprocedure Evaluation (Signed)
Anesthesia Post Note  Patient: [name]  Procedure(s) Performed: EXCISION LIPOMA AND EPIDERMAL CYST BACK (Back)     Patient location during evaluation: PACU Anesthesia Type: General Level of consciousness: awake and alert Pain management: pain level controlled Vital Signs Assessment: post-procedure vital signs reviewed and stable Respiratory status: spontaneous breathing, nonlabored ventilation, respiratory function stable and patient connected to nasal cannula oxygen Cardiovascular status: blood pressure returned to baseline and stable Postop Assessment: no apparent nausea or vomiting Anesthetic complications: no   No notable events documented.  Last Vitals:  Vitals:   03/05/21 1015 03/05/21 1030  BP: (!) 146/92 (!) 141/92  Pulse: (!) 57 65  Resp: 12 15  Temp: 36.4 C 36.4 C  SpO2: 94% 93%    Last Pain:  Vitals:   03/06/21 0954  TempSrc:   PainSc: 2                  Tiajuana Amass

## 2021-03-24 DIAGNOSIS — M19011 Primary osteoarthritis, right shoulder: Secondary | ICD-10-CM | POA: Diagnosis not present

## 2021-03-24 DIAGNOSIS — M25512 Pain in left shoulder: Secondary | ICD-10-CM | POA: Diagnosis not present

## 2021-03-24 DIAGNOSIS — R202 Paresthesia of skin: Secondary | ICD-10-CM | POA: Diagnosis not present

## 2021-03-24 DIAGNOSIS — M7581 Other shoulder lesions, right shoulder: Secondary | ICD-10-CM | POA: Diagnosis not present

## 2021-03-24 DIAGNOSIS — G8929 Other chronic pain: Secondary | ICD-10-CM | POA: Diagnosis not present

## 2021-03-24 DIAGNOSIS — M25511 Pain in right shoulder: Secondary | ICD-10-CM | POA: Diagnosis not present

## 2021-03-24 DIAGNOSIS — M19012 Primary osteoarthritis, left shoulder: Secondary | ICD-10-CM | POA: Diagnosis not present

## 2021-03-24 DIAGNOSIS — R200 Anesthesia of skin: Secondary | ICD-10-CM | POA: Diagnosis not present

## 2021-03-24 DIAGNOSIS — M542 Cervicalgia: Secondary | ICD-10-CM | POA: Diagnosis not present

## 2021-03-28 ENCOUNTER — Other Ambulatory Visit: Payer: Self-pay | Admitting: Internal Medicine

## 2021-03-28 ENCOUNTER — Ambulatory Visit: Payer: 59 | Admitting: Internal Medicine
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
## 2021-04-01 ENCOUNTER — Encounter: Payer: 59 | Attending: Psychology | Admitting: Psychology

## 2021-04-01 DIAGNOSIS — R41841 Cognitive communication deficit: Secondary | ICD-10-CM | POA: Insufficient documentation

## 2021-04-01 DIAGNOSIS — Z87820 Personal history of traumatic brain injury: Secondary | ICD-10-CM | POA: Insufficient documentation

## 2021-04-01 DIAGNOSIS — F0781 Postconcussional syndrome: Secondary | ICD-10-CM | POA: Insufficient documentation

## 2021-04-01 DIAGNOSIS — R413 Other amnesia: Secondary | ICD-10-CM | POA: Insufficient documentation

## 2021-06-02 DIAGNOSIS — M2669 Other specified disorders of temporomandibular joint: Secondary | ICD-10-CM | POA: Diagnosis not present

## 2021-06-02 DIAGNOSIS — H9202 Otalgia, left ear: Secondary | ICD-10-CM | POA: Diagnosis not present

## 2021-06-02 DIAGNOSIS — H903 Sensorineural hearing loss, bilateral: Secondary | ICD-10-CM | POA: Diagnosis not present

## 2021-06-20 ENCOUNTER — Other Ambulatory Visit: Payer: Self-pay | Admitting: Internal Medicine

## 2021-07-11 ENCOUNTER — Encounter: Payer: Self-pay | Admitting: Internal Medicine

## 2021-07-11 ENCOUNTER — Ambulatory Visit: Payer: 59 | Admitting: Internal Medicine
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
## 2021-07-14 DIAGNOSIS — M545 Low back pain, unspecified: Secondary | ICD-10-CM | POA: Diagnosis not present

## 2021-07-28 ENCOUNTER — Telehealth: Payer: Self-pay | Admitting: Internal Medicine

## 2021-07-28 MED ORDER — TRULICITY 1.5 MG/0.5ML ~~LOC~~ SOAJ: SUBCUTANEOUS | 1 refills | Status: DC

## 2021-07-28 MED ORDER — METFORMIN HCL ER 500 MG PO TB24: 2000.0000 mg | ORAL_TABLET | Freq: Every day | ORAL | 0 refills | Status: AC

## 2021-07-28 NOTE — Telephone Encounter (Signed)
Script has been sent.

## 2021-07-28 NOTE — Telephone Encounter (Signed)
Pt is calling in needing a refill on Rx's metformin (GLUCOPHAGE -XR) 500 MG (totally out) and TRULICITY 1.5 MG.  Pharm:  [facility] on [address].

## 2021-08-14 ENCOUNTER — Other Ambulatory Visit: Payer: Self-pay | Admitting: Family Medicine

## 2021-08-14 ENCOUNTER — Ambulatory Visit: Payer: Medicare Other | Admitting: Internal Medicine

## 2021-08-14 DIAGNOSIS — R1013 Epigastric pain: Secondary | ICD-10-CM

## 2021-08-20 DIAGNOSIS — I10 Essential (primary) hypertension: Secondary | ICD-10-CM | POA: Diagnosis not present

## 2021-08-20 DIAGNOSIS — R1010 Upper abdominal pain, unspecified: Secondary | ICD-10-CM | POA: Diagnosis not present

## 2021-08-20 DIAGNOSIS — E1169 Type 2 diabetes mellitus with other specified complication: Secondary | ICD-10-CM | POA: Diagnosis not present

## 2021-08-21 ENCOUNTER — Encounter: Payer: Medicare Other | Attending: Psychology | Admitting: Psychology

## 2021-08-21 ENCOUNTER — Other Ambulatory Visit: Payer: Self-pay

## 2021-08-21 DIAGNOSIS — R413 Other amnesia: Secondary | ICD-10-CM | POA: Insufficient documentation

## 2021-08-21 DIAGNOSIS — F0781 Postconcussional syndrome: Secondary | ICD-10-CM | POA: Diagnosis not present

## 2021-08-21 DIAGNOSIS — R41841 Cognitive communication deficit: Secondary | ICD-10-CM | POA: Diagnosis not present

## 2021-08-21 DIAGNOSIS — Z87820 Personal history of traumatic brain injury: Secondary | ICD-10-CM

## 2021-08-22 ENCOUNTER — Inpatient Hospital Stay: Admission: RE | Admit: 2021-08-22 | Payer: Medicare Other | Source: Ambulatory Visit

## 2021-08-25 ENCOUNTER — Ambulatory Visit: Payer: Medicare Other | Admitting: Internal Medicine

## 2021-08-26 ENCOUNTER — Encounter: Payer: Self-pay | Admitting: Psychology

## 2021-08-26 NOTE — Progress Notes (Signed)
08/21/2021 1 PM-2 PM: ? ?Today's visit was an in person visit that was conducted in outpatient clinic office with the patient myself present.  Today I provided feedback regarding the results of the recent neuropsychological evaluation and went over not only the recommendations supplied in the formal report but also recommendations in greater details and strategies for implementing attempts to improve her overall functioning.  I have included the summary impressions below for convenience and the patient's complete neuropsychological evaluation can be found in her EMR dated 11/20/2020. ? ? ?Summary of Results:                         ?  ?Overall, the results of the current neuropsychological evaluation are encouraging.  The patient does not show a wide range of specific deficits beyond those related to auditory encoding and likely multi processing weaknesses as well as some weaknesses with regard to visual problem-solving and visual organizational capacity relatively speaking.  The patient did show some variability to also suggest other factors of attention including difficulties with sustaining attention are also present.  The patient's memory capacity actually appears fairly intact without any significant deficits although her auditory memory and learning was greater than her visual memory and learning.  This does suggest some potential lateralization of weaknesses.  As far as memory functions the patient was able to adequately learn new information as well as retain that information over delay.  In fact, she showed further improvements on cued/recognition formats suggesting that more of her subjective experiences of memory and learning deficits were related to retrieval deficits rather than an inability to effectively store and organize new information.  This pattern is also consistent with the patient's subjective reports of expressive language changes and verbal fluency difficulties.  Previous findings of auditory  processing deficits are also consistent with the difficulty she has on auditory encoding versus visual encoding capacity.  However, none of these deficits were severe in nature but the overall pattern is consistent with a postconcussional syndrome long fibers fasciculus connections of white matter tracts but not indicating significant focal deficit type patterns overall. ?  ?Impression/Diagnosis:                     Overall, the results of the current neuropsychological evaluation including subjective symptoms reported by both the patient and her husband, objective medical information including imaging data and objective neuropsychological test performance do suggest the patient showing patterns consistent with a mild to moderate postconcussion syndrome.  Identifying the specific incident creating this pattern is essentially impossible as the patient has had 3-4 significant concussions with significant loss of consciousness and each 1 with the most recent being in October 2021.  The patient is still in the window is from the most recent for continued improvement from that event but some of the earlier concussions are beyond stages of neurological improvement. ?  ?The patient and her husband describe ongoing memory deficits and they noted both short-term memory as well as attention and concentration deficits.  I do think that the primary element that is creating her memory deficits have to do with difficulties with encoding auditory information, impairments with sustained attention and concentration, difficulties with certain types of information processing speed.  The patient also describes verbal fluency changes and word finding difficulties along with paraphasic errors.  This is also likely consistent with long fiber white matter tracts being impacted in these concussive events.  The patient's previous diagnosis  of having auditory processing deficits after her third concussion would generally be consistent with  the findings noted today but they are likely directly related to attentional components and having a particular deleterious effect on certain aspects of auditory processing and auditory encoding.  The patient is also described as having some ongoing personality and behavioral changes consistent with some frontal lobe involvement as well and while the patient's verbal and visual reasoning capacity appeared to be generally well-maintained and intact these changes and behavioral patterns would also be consistent with residual postconcussion syndrome and fasciculus connections between frontal lobes and other brain regions. ?  ?As far as treatment recommendations I do think that the patient would benefit from some therapeutic interventions around residual effects of her postconcussion syndrome particularly to help mediate and address issues of behavioral dyscontrol and work on specific strategies to address attentional deficits, retrieval memory deficits and expressive language deficits.  I will sit down with the patient and her husband and go over the results of this neuropsychological evaluation with treatment recommendations. ?  ?I would like to note that these findings are very encouraging regarding the level of overall functioning the patient is continuing to be able to maintain.  Given the nature of her multiple concussive events she actually appears to have been able to preserve the majority of her cognitive functioning and all of the deficits that were noted were not profound or significantly impaired and compensating strategies potentially could be very helpful for the patient. ?  ?  ?Diagnosis:                              ?  ?Post concussive syndrome ?  ?Hx of multiple concussions ?  ?Memory loss ?  ?  ?_____________________ ?Ilean Skill, Psy.D. ?Clinical Neuropsychologist ?

## 2021-09-29 ENCOUNTER — Encounter: Payer: Self-pay | Admitting: Family Medicine

## 2021-10-01 ENCOUNTER — Encounter: Payer: Self-pay | Admitting: Family Medicine

## 2021-10-02 ENCOUNTER — Encounter: Payer: Self-pay | Admitting: Nurse Practitioner

## 2021-10-06 ENCOUNTER — Other Ambulatory Visit: Payer: Self-pay | Admitting: Internal Medicine

## 2021-10-17 ENCOUNTER — Ambulatory Visit (HOSPITAL_COMMUNITY): Payer: Medicare Other

## 2021-10-17 ENCOUNTER — Encounter (HOSPITAL_COMMUNITY): Payer: Self-pay | Admitting: Emergency Medicine

## 2021-10-17 ENCOUNTER — Ambulatory Visit (HOSPITAL_COMMUNITY)
Admission: EM | Admit: 2021-10-17 | Discharge: 2021-10-17 | Disposition: A | Discharge status: Home / Self Care | Payer: Medicare Other | Attending: Family Medicine | Admitting: Family Medicine

## 2021-10-17 ENCOUNTER — Ambulatory Visit (INDEPENDENT_AMBULATORY_CARE_PROVIDER_SITE_OTHER): Payer: Medicare Other

## 2021-10-17 DIAGNOSIS — M542 Cervicalgia: Secondary | ICD-10-CM | POA: Diagnosis not present

## 2021-10-17 DIAGNOSIS — S161XXA Strain of muscle, fascia and tendon at neck level, initial encounter: Secondary | ICD-10-CM

## 2021-10-17 DIAGNOSIS — S39012A Strain of muscle, fascia and tendon of lower back, initial encounter: Secondary | ICD-10-CM

## 2021-10-17 MED ORDER — TIZANIDINE HCL 2 MG PO TABS: 2.0000 mg | ORAL_TABLET | Freq: Three times a day (TID) | ORAL | 0 refills | Status: AC | PRN

## 2021-10-17 MED ORDER — CYCLOBENZAPRINE HCL 10 MG PO TABS: 10.0000 mg | ORAL_TABLET | Freq: Two times a day (BID) | ORAL | 0 refills | Status: DC | PRN

## 2021-10-17 MED ORDER — NAPROXEN 375 MG PO TABS: 375.0000 mg | ORAL_TABLET | Freq: Two times a day (BID) | ORAL | 0 refills | Status: AC
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
## 2021-10-20 ENCOUNTER — Encounter: Payer: Self-pay | Admitting: Nurse Practitioner

## 2021-10-20 ENCOUNTER — Ambulatory Visit (INDEPENDENT_AMBULATORY_CARE_PROVIDER_SITE_OTHER): Payer: No Typology Code available for payment source | Admitting: Nurse Practitioner

## 2021-10-20 ENCOUNTER — Other Ambulatory Visit: Payer: Self-pay | Admitting: Nurse Practitioner

## 2021-10-20 VITALS — BP 142/80 | HR 70 | Ht 65.0 in | Wt 228.4 lb

## 2021-10-20 DIAGNOSIS — K589 Irritable bowel syndrome without diarrhea: Secondary | ICD-10-CM | POA: Diagnosis not present

## 2021-10-20 DIAGNOSIS — R1013 Epigastric pain: Secondary | ICD-10-CM

## 2021-10-20 MED ORDER — FAMOTIDINE 20 MG PO TABS: 20.0000 mg | ORAL_TABLET | Freq: Every day | ORAL | 1 refills | Status: DC

## 2021-10-20 NOTE — Patient Instructions (Signed)
You have been scheduled for a EGD. Please follow the written instructions given to you at your visit today. ?If you use inhalers (even only as needed), please bring them with you on the day of your procedure. ? ?Take Benefiber- 1 tablespoon daily. ?Take Famotidine 20 MG tablet, take 1 tablet daily. ? ?Gastroesophageal Reflux Disease, Adult ? ?Gastroesophageal reflux (GER) happens when acid from the stomach flows up into the tube that connects the mouth and the stomach (esophagus). Normally, food travels down the esophagus and stays in the stomach to be digested. With GER, food and stomach acid sometimes move back up into the esophagus. ?You may have a disease called gastroesophageal reflux disease (GERD) if the reflux: ?Happens often. ?Causes frequent or very bad symptoms. ?Causes problems such as damage to the esophagus. ?When this happens, the esophagus becomes sore and swollen. Over time, GERD can make small holes (ulcers) in the lining of the esophagus. ?What are the causes? ?This condition is caused by a problem with the muscle between the esophagus and the stomach. When this muscle is weak or not normal, it does not close properly to keep food and acid from coming back up from the stomach. ?The muscle can be weak because of: ?Tobacco use. ?Pregnancy. ?Having a certain type of hernia (hiatal hernia). ?Alcohol use. ?Certain foods and drinks, such as coffee, chocolate, onions, and peppermint. ?What increases the risk? ?Being overweight. ?Having a disease that affects your connective tissue. ?Taking NSAIDs, such a ibuprofen. ?What are the signs or symptoms? ?Heartburn. ?Difficult or painful swallowing. ?The feeling of having a lump in the throat. ?A bitter taste in the mouth. ?Bad breath. ?Having a lot of saliva. ?Having an upset or bloated stomach. ?Burping. ?Chest pain. Different conditions can cause chest pain. Make sure you see your doctor if you have chest pain. ?Shortness of breath or wheezing. ?A long-term  cough or a cough at night. ?Wearing away of the surface of teeth (tooth enamel). ?Weight loss. ?How is this treated? ?Making changes to your diet. ?Taking medicine. ?Having surgery. ?Treatment will depend on how bad your symptoms are. ?Follow these instructions at home: ?Eating and drinking ? ?Follow a diet as told by your doctor. You may need to avoid foods and drinks such as: ?Coffee and tea, with or without caffeine. ?Drinks that contain alcohol. ?Energy drinks and sports drinks. ?Bubbly (carbonated) drinks or sodas. ?Chocolate and cocoa. ?Peppermint and mint flavorings. ?Garlic and onions. ?Horseradish. ?Spicy and acidic foods. These include peppers, chili powder, curry powder, vinegar, hot sauces, and BBQ sauce. ?Citrus fruit juices and citrus fruits, such as oranges, lemons, and limes. ?Tomato-based foods. These include red sauce, chili, salsa, and pizza with red sauce. ?Fried and fatty foods. These include donuts, french fries, potato chips, and high-fat dressings. ?High-fat meats. These include hot dogs, rib eye steak, sausage, ham, and bacon. ?High-fat dairy items, such as whole milk, butter, and cream cheese. ?Eat small meals often. Avoid eating large meals. ?Avoid drinking large amounts of liquid with your meals. ?Avoid eating meals during the 2-3 hours before bedtime. ?Avoid lying down right after you eat. ?Do not exercise right after you eat. ?Lifestyle ? ?Do not smoke or use any products that contain nicotine or tobacco. If you need help quitting, ask your doctor. ?Try to lower your stress. If you need help doing this, ask your doctor. ?If you are overweight, lose an amount of weight that is healthy for you. Ask your doctor about a safe weight loss goal. ?General  instructions ?Pay attention to any changes in your symptoms. ?Take over-the-counter and prescription medicines only as told by your doctor. ?Do not take aspirin, ibuprofen, or other NSAIDs unless your doctor says it is okay. ?Wear loose  clothes. Do not wear anything tight around your waist. ?Raise (elevate) the head of your bed about 6 inches (15 cm). You may need to use a wedge to do this. ?Avoid bending over if this makes your symptoms worse. ?Keep all follow-up visits. ?Contact a doctor if: ?You have new symptoms. ?You lose weight and you do not know why. ?You have trouble swallowing or it hurts to swallow. ?You have wheezing or a cough that keeps happening. ?You have a hoarse voice. ?Your symptoms do not get better with treatment. ?Get help right away if: ?You have sudden pain in your arms, neck, jaw, teeth, or back. ?You suddenly feel sweaty, dizzy, or light-headed. ?You have chest pain or shortness of breath. ?You vomit and the vomit is green, yellow, or black, or it looks like blood or coffee grounds. ?You faint. ?Your poop (stool) is red, bloody, or black. ?You cannot swallow, drink, or eat. ?These symptoms may represent a serious problem that is an emergency. Do not wait to see if the symptoms will go away. Get medical help right away. Call your local emergency services (911 in the [place] Do not drive yourself to the hospital. ?Summary ?If a person has gastroesophageal reflux disease (GERD), food and stomach acid move back up into the esophagus and cause symptoms or problems such as damage to the esophagus. ?Treatment will depend on how bad your symptoms are. ?Follow a diet as told by your doctor. ?Take all medicines only as told by your doctor. ?This information is not intended to replace advice given to you by your health care provider. Make sure you discuss any questions you have with your health care provider. ?Document Revised: 12/11/2019 Document Reviewed: 12/11/2019 ?Elsevier Patient Education ? [employer]. ? ?Thank you for trusting me with your gastrointestinal care!   ? ?Noralyn Pick, CRNP ? ? ? ?BMI: ? ?If you are age 56 or older, your body mass index should be between 23-30. Your Body mass index is 38.01  kg/m?Marland Kitchen If this is out of the aforementioned range listed, please consider follow up with your Primary Care Provider. ? ?If you are age 74 or younger, your body mass index should be between 19-25. Your Body mass index is 38.01 kg/m?Marland Kitchen If this is out of the aformentioned range listed, please consider follow up with your Primary Care Provider.  ? ?MY CHART: ? ?The [facility] providers would like to encourage you to use [facility] to communicate with providers for non-urgent requests or questions.  Due to long hold times on the telephone, sending your provider a message by [facility] may be a faster and more efficient way to get a response.  Please allow 48 business hours for a response.  Please remember that this is for non-urgent requests.  ? ? ? ?
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
## 2021-10-21 NOTE — Progress Notes (Signed)
Pt placed on move up list ?

## 2021-10-21 NOTE — Progress Notes (Signed)
Reviewed and agree with management plans. Please place on cancellation list in the even that an earlier endoscopy time becomes available. ? ?Ertha Nabor L. Tarri Glenn, MD, MPH  ?

## 2021-12-02 ENCOUNTER — Other Ambulatory Visit: Payer: Self-pay | Admitting: Surgery

## 2021-12-02 DIAGNOSIS — R1013 Epigastric pain: Secondary | ICD-10-CM

## 2021-12-02 DIAGNOSIS — K824 Cholesterolosis of gallbladder: Secondary | ICD-10-CM

## 2021-12-05 ENCOUNTER — Inpatient Hospital Stay: Admission: RE | Admit: 2021-12-05 | Payer: Medicare Other | Source: Ambulatory Visit

## 2021-12-08 ENCOUNTER — Encounter: Payer: Medicare Other | Admitting: Gastroenterology

## 2021-12-08 ENCOUNTER — Telehealth: Payer: Self-pay | Admitting: Gastroenterology

## 2021-12-17 ENCOUNTER — Other Ambulatory Visit: Payer: Self-pay | Admitting: Internal Medicine

## 2021-12-18 DIAGNOSIS — Z23 Encounter for immunization: Secondary | ICD-10-CM | POA: Diagnosis not present

## 2021-12-18 DIAGNOSIS — I10 Essential (primary) hypertension: Secondary | ICD-10-CM | POA: Diagnosis not present

## 2021-12-18 DIAGNOSIS — Z6837 Body mass index (BMI) 37.0-37.9, adult: Secondary | ICD-10-CM | POA: Diagnosis not present

## 2021-12-18 DIAGNOSIS — Z0000 Encounter for general adult medical examination without abnormal findings: Secondary | ICD-10-CM | POA: Diagnosis not present

## 2021-12-18 DIAGNOSIS — E785 Hyperlipidemia, unspecified: Secondary | ICD-10-CM | POA: Diagnosis not present

## 2021-12-18 DIAGNOSIS — G4733 Obstructive sleep apnea (adult) (pediatric): Secondary | ICD-10-CM | POA: Diagnosis not present

## 2021-12-18 DIAGNOSIS — E1165 Type 2 diabetes mellitus with hyperglycemia: Secondary | ICD-10-CM | POA: Diagnosis not present

## 2021-12-23 ENCOUNTER — Ambulatory Visit: Payer: Self-pay | Admitting: Psychology

## 2022-01-09 DIAGNOSIS — U071 COVID-19: Secondary | ICD-10-CM | POA: Diagnosis not present

## 2022-01-09 DIAGNOSIS — R509 Fever, unspecified: Secondary | ICD-10-CM | POA: Diagnosis not present

## 2022-01-09 DIAGNOSIS — R0981 Nasal congestion: Secondary | ICD-10-CM | POA: Diagnosis not present

## 2022-01-09 DIAGNOSIS — R5383 Other fatigue: Secondary | ICD-10-CM | POA: Diagnosis not present

## 2022-01-20 ENCOUNTER — Inpatient Hospital Stay: Admission: RE | Admit: 2022-01-20 | Payer: Medicare Other | Source: Ambulatory Visit

## 2022-01-21 ENCOUNTER — Encounter (INDEPENDENT_AMBULATORY_CARE_PROVIDER_SITE_OTHER): Payer: Self-pay

## 2022-03-10 ENCOUNTER — Ambulatory Visit: Payer: Self-pay | Admitting: Internal Medicine

## 2022-03-10 ENCOUNTER — Encounter: Payer: Self-pay | Admitting: Internal Medicine

## 2022-03-10 ENCOUNTER — Ambulatory Visit: Payer: Medicare Other | Admitting: Internal Medicine
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
## 2022-03-18 ENCOUNTER — Telehealth: Payer: Self-pay | Admitting: Internal Medicine

## 2022-03-18 NOTE — Telephone Encounter (Signed)
Patient dismissed from [facility] by Vivia Ewing, MD, effective 03/10/22. Dismissal Letter sent out by 1st class mail. KLM

## 2022-04-08 DIAGNOSIS — F331 Major depressive disorder, recurrent, moderate: Secondary | ICD-10-CM | POA: Diagnosis not present

## 2022-04-08 DIAGNOSIS — E1140 Type 2 diabetes mellitus with diabetic neuropathy, unspecified: Secondary | ICD-10-CM | POA: Diagnosis not present

## 2022-04-08 DIAGNOSIS — E785 Hyperlipidemia, unspecified: Secondary | ICD-10-CM | POA: Diagnosis not present

## 2022-04-08 DIAGNOSIS — I10 Essential (primary) hypertension: Secondary | ICD-10-CM | POA: Diagnosis not present

## 2022-04-17 DIAGNOSIS — M545 Low back pain, unspecified: Secondary | ICD-10-CM | POA: Diagnosis not present

## 2022-06-30 DIAGNOSIS — E1140 Type 2 diabetes mellitus with diabetic neuropathy, unspecified: Secondary | ICD-10-CM | POA: Diagnosis not present

## 2022-06-30 DIAGNOSIS — A281 Cat-scratch disease: Secondary | ICD-10-CM | POA: Diagnosis not present

## 2022-06-30 DIAGNOSIS — R591 Generalized enlarged lymph nodes: Secondary | ICD-10-CM | POA: Diagnosis not present

## 2022-06-30 DIAGNOSIS — I10 Essential (primary) hypertension: Secondary | ICD-10-CM | POA: Diagnosis not present

## 2022-07-07 DIAGNOSIS — R590 Localized enlarged lymph nodes: Secondary | ICD-10-CM | POA: Diagnosis not present

## 2022-07-07 DIAGNOSIS — E1140 Type 2 diabetes mellitus with diabetic neuropathy, unspecified: Secondary | ICD-10-CM | POA: Diagnosis not present

## 2022-07-07 DIAGNOSIS — W5503XA Scratched by cat, initial encounter: Secondary | ICD-10-CM | POA: Diagnosis not present

## 2022-07-08 ENCOUNTER — Other Ambulatory Visit: Payer: Self-pay | Admitting: Family Medicine

## 2022-07-08 DIAGNOSIS — R590 Localized enlarged lymph nodes: Secondary | ICD-10-CM

## 2022-07-13 ENCOUNTER — Ambulatory Visit
Admission: RE | Admit: 2022-07-13 | Discharge: 2022-07-13 | Disposition: A | Discharge status: Home / Self Care | Payer: Medicare Other | Source: Ambulatory Visit | Attending: Family Medicine | Admitting: Family Medicine

## 2022-07-13 DIAGNOSIS — R590 Localized enlarged lymph nodes: Secondary | ICD-10-CM
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
## 2022-08-24 ENCOUNTER — Ambulatory Visit: Payer: Medicare Other | Admitting: Physician Assistant

## 2022-10-01 ENCOUNTER — Ambulatory Visit (INDEPENDENT_AMBULATORY_CARE_PROVIDER_SITE_OTHER): Payer: 59 | Admitting: Physician Assistant

## 2022-10-01 ENCOUNTER — Encounter: Payer: Self-pay | Admitting: Physician Assistant

## 2022-10-01 DIAGNOSIS — R112 Nausea with vomiting, unspecified: Secondary | ICD-10-CM | POA: Diagnosis not present

## 2022-10-01 DIAGNOSIS — R110 Nausea: Secondary | ICD-10-CM | POA: Diagnosis not present

## 2022-10-01 DIAGNOSIS — R109 Unspecified abdominal pain: Secondary | ICD-10-CM | POA: Diagnosis not present

## 2022-10-01 MED ORDER — OMEPRAZOLE 40 MG PO CPDR: 40.0000 mg | DELAYED_RELEASE_CAPSULE | Freq: Every day | ORAL | 3 refills | Status: DC

## 2022-10-01 NOTE — Patient Instructions (Addendum)
_______________________________________________________  If your blood pressure at your visit was 140/90 or greater, please contact your primary care physician to follow up on this.  If you are age 57 or younger, your body mass index should be between 19-25. Your Body mass index is 34.7 kg/m. If this is out of the aformentioned range listed, please consider follow up with your Primary Care Provider.  ________________________________________________________  The [place] [facility] providers would like to encourage you to use [facility] to communicate with providers for non-urgent requests or questions.  Due to long hold times on the telephone, sending your provider a message by [facility] may be a faster and more efficient way to get a response.  Please allow 48 business hours for a response.  Please remember that this is for non-urgent requests.  _______________________________________________________  Bonita Quin have been scheduled for an abdominal ultrasound at [facility] (1st floor of hospital) on 10-06-22 at 8:30am. Please arrive 30 minutes prior to your appointment for registration. Make certain not to have anything to eat or drink after midnight the night prior to your appointment. Should you need to reschedule your appointment, please contact radiology at [phone]. This test typically takes about 30 minutes to perform.  You have been scheduled for an endoscopy. Please follow written instructions given to you at your visit today. If you use inhalers (even only as needed), please bring them with you on the day of your procedure.  Due to recent changes in healthcare laws, you may see the results of your imaging and laboratory studies on MyChart before your provider has had a chance to review them.  We understand that in some cases there may be results that are confusing or concerning to you. Not all laboratory results come back in the same time frame and the provider may be waiting for multiple results  in order to interpret others.  Please give [place] 48 hours in order for your provider to thoroughly review all the results before contacting the office for clarification of your results.   We have sent the following medications to your pharmacy for you to pick up at your convenience:  START: Omeprazole  one capsule daily before breakfast meal.  We will attempt to obtain colonoscopy report from [facility].  Thank you for entrusting me with your care and choosing [facility].  Amy Esterwood, PA-C

## 2022-10-04 NOTE — Progress Notes (Signed)
Subjective:    Patient ID: [name], female    DOB: 1966/02/03, 57 y.o.   MRN: [id number]  HPI Haja is a pleasant 57 year old female, who had been established with Dr. Orvan Falconer.  Last seen in the office in May 2023 with IBS type symptoms and some complaints of upper abdominal pain.  She was on Trulicity and metformin at that time.  Plan was to have been EGD and CT of the abdomen and pelvis however those were not scheduled. She says she did have a prior colonoscopy at [facility] about 6 years ago and was told that was negative and to follow-up in 10 years. She comes in today with complaints of epigastric pain which she describes as a crampy type of pain that is present all day long some days..  She says sometimes the pain is bad and she describes this as a rolling type discomfort she will have associated nausea intermittently and has had occasional episodes of vomiting though not on a regular basis.  On careful questioning it sounds as if she is having episodes of this type of pain that are occurring at least once weekly.  No associated diarrhea, bowel movements have been okay.  She says when she does eat she gets an irritated upset feeling in her stomach and some generalized discomfort. She has been off of metformin, but has remained on Trulicity and has lost about 40 pounds over the past year and a half. When she is asked whether she can correlate increase in symptoms after Trulicity injection she says she does feel that her GI symptoms are worse after her weekly injection. She had tried low-dose famotidine without any improvement in symptoms, not currently taking. No regular aspirin or NSAIDs. Other problems include hypertension, asthma, sleep apnea with CPAP use, adult onset diabetes mellitus and obesity.  Review of Systems Pertinent positive and negative review of systems were noted in the above HPI section.  All other review of systems was otherwise negative.   Outpatient  Encounter Medications as of 10/01/2022  Medication Sig   Albuterol Sulfate (PROAIR RESPICLICK) 108 (90 Base) MCG/ACT AEPB INHALE 1 PUFF BY MOUTH EVERY 4 HOURS AS NEEDED   amLODipine (NORVASC) 10 MG tablet Take 10 mg by mouth every morning.    aspirin EC 81 MG tablet Take 1 tablet (81 mg total) by mouth daily.   atorvastatin (LIPITOR) 10 MG tablet TAKE 1 TABLET(10 MG) BY MOUTH DAILY   Calcium Carbonate-Vitamin D (CALCIUM 600 + D PO) Take 2 tablets by mouth every morning.    carboxymethylcellulose (REFRESH PLUS) 0.5 % SOLN Place 1 drop into both eyes 3 (three) times daily as needed (dry eyes).   glucose blood (ONETOUCH VERIO) test strip Use as instructed to test blood sugar 2 times daily E11.65   metoprolol (TOPROL-XL) 200 MG 24 hr tablet Take 200 mg by mouth every evening.    omeprazole (PRILOSEC) 40 MG capsule Take 1 capsule (40 mg total) by mouth daily.   triamcinolone cream (KENALOG) 0.1 % as needed (flare ups).   TRULICITY 1.5 MG/0.5ML SOPN ADMINISTER 1.5 MG UNDER THE SKIN 1 TIME A WEEK (Patient taking differently: ADMINISTER 4.5 MG UNDER THE SKIN 1 TIME A WEEK)   famotidine (PEPCID) 20 MG tablet TAKE 1 TABLET(20 MG) BY MOUTH DAILY (Patient not taking: Reported on 10/01/2022)   glimepiride (AMARYL) 2 MG tablet Take 1 tablet (2 mg total) by mouth daily before breakfast. (Patient not taking: Reported on 10/01/2022)   metFORMIN (  GLUCOPHAGE-XR) 500 MG 24 hr tablet Take 4 tablets (2,000 mg total) by mouth daily with supper. Take 2,000 mg by mouth daily with supper. (Patient not taking: Reported on 10/01/2022)   naproxen (NAPROSYN) 375 MG tablet Take 1 tablet (375 mg total) by mouth 2 (two) times daily. (Patient not taking: Reported on 10/01/2022)   tiZANidine (ZANAFLEX) 2 MG tablet Take 1-2 tablets (2-4 mg total) by mouth every 8 (eight) hours as needed for muscle spasms. (Patient not taking: Reported on 10/01/2022)   No facility-administered encounter medications on file as of 10/01/2022.   Allergies   Allergen Reactions   Latex     Latex tape causes rash   Patient Active Problem List   Diagnosis Date Noted   Post concussive syndrome 05/21/2020   Hx of multiple concussions 05/21/2020   Dyslipidemia 03/28/2020   Type 2 diabetes mellitus with hyperglycemia, without long-term current use of insulin 08/16/2019   Type 2 diabetes mellitus with diabetic polyneuropathy, without long-term current use of insulin 08/16/2019   Dilated aortic root    Plantar fasciitis 06/01/2018   Hypokalemia 02/17/2016   Chest pain 02/16/2016   Morbid obesity with BMI of 40.0-44.9, adult 05/15/2013   Fatty liver    OSA (obstructive sleep apnea)    Hypertension    Diabetes mellitus without complication    Asthma    Social History   Socioeconomic History   Marital status: Married    Spouse name: Not on file   Number of children: 2   Years of education: Not on file   Highest education level: Some college, no degree  Occupational History   Occupation: disability  Tobacco Use   Smoking status: Former    Packs/day: 1.00    Years: 10.00    Additional pack years: 0.00    Total pack years: 10.00    Types: Cigarettes    Quit date: 2004    Years since quitting: 20.3   Smokeless tobacco: Never  Vaping Use   Vaping Use: Never used  Substance and Sexual Activity   Alcohol use: Yes    Comment: occ   Drug use: Not Currently   Sexual activity: Not on file  Other Topics Concern   Not on file  Social History Narrative   Caffeine: occasional    Right handed   Lives at home with husband, father, and two sons   Social Determinants of Health   Financial Resource Strain: Not on file  Food Insecurity: Not on file  Transportation Needs: Not on file  Physical Activity: Not on file  Stress: Not on file  Social Connections: Not on file  Intimate Partner Violence: Not on file    Ms. Gerilyn Pilgrim Campbell's family history includes Arthritis in her mother; Breast cancer in her mother and paternal grandmother;  Colon polyps in her father and mother; Diabetes in her brother, father, mother, and sister; Heart disease in her father and paternal grandfather; Hypertension in her brother, father, maternal aunt, maternal grandfather, maternal grandmother, maternal uncle, mother, paternal aunt, paternal grandfather, paternal grandmother, paternal uncle, and sister; Kidney disease in her father; Lymphoma in her mother; Ovarian cancer in her maternal grandmother; Prostate cancer in her father; Stomach cancer in her paternal grandmother.      Objective:    Vitals:   10/01/22 1406  BP: 118/78  Pulse: 95    Physical Exam.Well-developed well-nourished older African-American female in no acute distress.  Height, Weight, 215 BMI 34.7 accompanied by husband  HEENT; nontraumatic normocephalic, EOMI, PE  R LA, sclera anicteric. Oropharynx; not examined today Neck; supple, no JVD Cardiovascular; regular rate and rhythm with S1-S2, no murmur rub or gallop Pulmonary; Clear bilaterally Abdomen; soft, there is some mild tenderness in the epigastrium/hypogastrium nondistended, no palpable mass or hepatosplenomegaly, bowel sounds are active Rectal; not done today Skin; benign exam, no jaundice rash or appreciable lesions Extremities; no clubbing cyanosis or edema skin warm and dry Neuro/Psych; alert and oriented x4, grossly nonfocal mood and affect appropriate        Assessment & Plan:   Number one 57 year old African-American female with several month history of ongoing symptoms with episodes of epigastric pain which may last for a day or longer, described as rolling and irritated, associated with nausea intermittently occasional vomiting and worse with p.o. intake.  In between these episodes she will have some milder symptoms. Symptoms seem to be exacerbated after weekly Trulicity injection.  I suspect her symptoms are medication induced secondary to Trulicity which is known to cause a host of GI symptoms, also  consider biliary colic, gastropathy, peptic ulcer disease, IBS  #2 colon cancer screening-up-to-date with negative colonoscopy 6 years ago done at [facility] per patient negative and was told to follow-up in 10 years #3.  Adult onset diabetes mellitus #4.  Obesity #5.  Sleep apnea with CPAP use #6.  Asthma #7.  Hypertension  Plan  1)Will start omeprazole 40 mg p.o. every morning AC breakfast #30 and 1 refill 2).  Patient will be scheduled for upper abdominal ultrasound 3).  Patient will be scheduled for EGD with Dr. Barron Alvine.  Procedure was discussed in detail with the patient including indications risk and benefits and she is agreeable to proceed. 4)If above workup is unrevealing she will need to stop Trulicity for couple of months as a trial. 5)Patient will sign a release and will obtain copy of her prior colonoscopy record so that she can be slated for appropriate recall.  Yvette Loveless Oswald Hillock PA-C 10/04/2022   Cc: Shirlean Mylar, MD

## 2022-10-06 ENCOUNTER — Ambulatory Visit (HOSPITAL_COMMUNITY): Admission: RE | Admit: 2022-10-06 | Payer: Medicare Other | Source: Ambulatory Visit

## 2022-10-07 NOTE — Progress Notes (Signed)
Agree with the assessment and plan as outlined by Amy Esterwood, PA-C.  Berklee Battey, DO, FACG  

## 2022-10-26 ENCOUNTER — Ambulatory Visit (HOSPITAL_COMMUNITY)
Admission: RE | Admit: 2022-10-26 | Discharge: 2022-10-26 | Disposition: A | Discharge status: Home / Self Care | Payer: Medicare Other | Source: Ambulatory Visit | Attending: Physician Assistant | Admitting: Physician Assistant

## 2022-10-26 DIAGNOSIS — R109 Unspecified abdominal pain: Secondary | ICD-10-CM | POA: Diagnosis not present

## 2022-10-26 DIAGNOSIS — R1013 Epigastric pain: Secondary | ICD-10-CM | POA: Diagnosis not present

## 2022-10-26 DIAGNOSIS — R110 Nausea: Secondary | ICD-10-CM | POA: Insufficient documentation

## 2022-10-26 DIAGNOSIS — R112 Nausea with vomiting, unspecified: Secondary | ICD-10-CM

## 2022-10-26 DIAGNOSIS — K824 Cholesterolosis of gallbladder: Secondary | ICD-10-CM | POA: Diagnosis not present

## 2022-10-27 ENCOUNTER — Encounter: Payer: 59 | Admitting: Gastroenterology

## 2022-11-02 ENCOUNTER — Encounter: Payer: 59 | Admitting: Gastroenterology

## 2022-11-12 ENCOUNTER — Ambulatory Visit: Payer: 59 | Admitting: Gastroenterology

## 2022-11-12 ENCOUNTER — Other Ambulatory Visit: Payer: Self-pay | Admitting: Gastroenterology

## 2022-11-12 ENCOUNTER — Encounter: Payer: Self-pay | Admitting: Gastroenterology

## 2022-11-12 VITALS — BP 154/93 | HR 66 | Temp 97.1°F | Resp 12 | Ht 66.0 in | Wt 215.0 lb

## 2022-11-12 DIAGNOSIS — R1013 Epigastric pain: Secondary | ICD-10-CM | POA: Diagnosis not present

## 2022-11-12 DIAGNOSIS — E7800 Pure hypercholesterolemia, unspecified: Secondary | ICD-10-CM | POA: Diagnosis not present

## 2022-11-12 DIAGNOSIS — R112 Nausea with vomiting, unspecified: Secondary | ICD-10-CM

## 2022-11-12 DIAGNOSIS — K259 Gastric ulcer, unspecified as acute or chronic, without hemorrhage or perforation: Secondary | ICD-10-CM

## 2022-11-12 DIAGNOSIS — G4733 Obstructive sleep apnea (adult) (pediatric): Secondary | ICD-10-CM | POA: Diagnosis not present

## 2022-11-12 DIAGNOSIS — K2970 Gastritis, unspecified, without bleeding: Secondary | ICD-10-CM | POA: Diagnosis not present

## 2022-11-12 DIAGNOSIS — I10 Essential (primary) hypertension: Secondary | ICD-10-CM | POA: Diagnosis not present

## 2022-11-12 DIAGNOSIS — F419 Anxiety disorder, unspecified: Secondary | ICD-10-CM | POA: Diagnosis not present

## 2022-11-12 MED ORDER — SODIUM CHLORIDE 0.9 % IV SOLN: 500.0000 mL | INTRAVENOUS | Status: DC

## 2022-11-12 MED ORDER — SUCRALFATE 1 G PO TABS: 1.0000 g | ORAL_TABLET | Freq: Two times a day (BID) | ORAL | 0 refills | Status: AC

## 2022-11-12 MED ORDER — PANTOPRAZOLE SODIUM 40 MG PO TBEC
40.0000 mg | DELAYED_RELEASE_TABLET | Freq: Two times a day (BID) | ORAL | 0 refills | Status: AC
Start: 2022-11-12 — End: 2023-01-07

## 2022-11-12 MED ORDER — PANTOPRAZOLE SODIUM 40 MG PO TBEC: 40.0000 mg | DELAYED_RELEASE_TABLET | Freq: Every day | ORAL | 3 refills | Status: AC
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
## 2022-11-12 NOTE — Progress Notes (Signed)
Pt's states no medical or surgical changes since previsit or office visit. 

## 2022-11-12 NOTE — Progress Notes (Signed)
Uneventful anesthetic. Report to pacu rn. Vss. Care resumed by rn. 

## 2022-11-12 NOTE — Patient Instructions (Addendum)
Resume previous diet Continue present medications, start taking Pantoprazole/Protonix twice daily for 8 weeks, then take once daily thereafter. Start taking Sucralfate/carafate twice daily for 4 weeks Both of these medications will help heal your Gastritis and need to be taken whether or not you feel better taking them, you may take your pepcid as well to help with your symptoms EGD scheduled in August, also follow up visit with Amy Await pathology results  Handouts/information given for gastritis  YOU HAD AN ENDOSCOPIC PROCEDURE TODAY AT THE [facility]:   Refer to the procedure report that was given to you for any specific questions about what was found during the examination.  If the procedure report does not answer your questions, please call your gastroenterologist to clarify.  If you requested that your care partner not be given the details of your procedure findings, then the procedure report has been included in a sealed envelope for you to review at your convenience later.  YOU SHOULD EXPECT: Some feelings of bloating in the abdomen. Passage of more gas than usual.  Walking can help get rid of the air that was put into your GI tract during the procedure and reduce the bloating. If you had a lower endoscopy (such as a colonoscopy or flexible sigmoidoscopy) you may notice spotting of blood in your stool or on the toilet paper. If you underwent a bowel prep for your procedure, you may not have a normal bowel movement for a few days.  Please Note:  You might notice some irritation and congestion in your nose or some drainage.  This is from the oxygen used during your procedure.  There is no need for concern and it should clear up in a day or so.  SYMPTOMS TO REPORT IMMEDIATELY:  Following upper endoscopy (EGD)  Vomiting of blood or coffee ground material  New chest pain or pain under the shoulder blades  Painful or persistently difficult swallowing  New shortness of  breath  Fever of 100F or higher  Black, tarry-looking stools  For urgent or emergent issues, a gastroenterologist can be reached at any hour by calling [phone]. Do not use MyChart messaging for urgent concerns.   DIET:  We do recommend a small meal at first, but then you may proceed to your regular diet.  Drink plenty of fluids but you should avoid alcoholic beverages for 24 hours.  ACTIVITY:  You should plan to take it easy for the rest of today and you should NOT DRIVE or use heavy machinery until tomorrow (because of the sedation medicines used during the test).    FOLLOW UP: Our staff will call the number listed on your records the next business day following your procedure.  We will call around 7:15- 8:00 am to check on you and address any questions or concerns that you may have regarding the information given to you following your procedure. If we do not reach you, we will leave a message.     If any biopsies were taken you will be contacted by phone or by letter within the next 1-3 weeks.  Please call us at [phone] if you have not heard about the biopsies in 3 weeks.    SIGNATURES/CONFIDENTIALITY: You and/or your care partner have signed paperwork which will be entered into your electronic medical record.  These signatures attest to the fact that that the information above on your After Visit Summary has been reviewed and is understood.  Full responsibility of the confidentiality of  this discharge information lies with you and/or your care-partner.

## 2022-11-12 NOTE — Op Note (Signed)
[facility] Patient Name: [name] Procedure Date: 11/12/2022 10:05 AM MRN: [id number] Endoscopist: Doristine Locks , MD, [id number] Age: 57 Referring MD:  Date of Birth: 1965-11-21 Gender: Female Account #: [id number] Procedure:                Upper GI endoscopy Indications:              Epigastric abdominal pain, Nausea with vomiting Medicines:                Monitored Anesthesia Care Procedure:                Pre-Anesthesia Assessment:                           - Prior to the procedure, a History and Physical                            was performed, and patient medications and                            allergies were reviewed. The patient's tolerance of                            previous anesthesia was also reviewed. The risks                            and benefits of the procedure and the sedation                            options and risks were discussed with the patient.                            All questions were answered, and informed consent                            was obtained. Prior Anticoagulants: The patient has                            taken no anticoagulant or antiplatelet agents. ASA                            Grade Assessment: II - A patient with mild systemic                            disease. After reviewing the risks and benefits,                            the patient was deemed in satisfactory condition to                            undergo the procedure.                           After obtaining informed consent, the endoscope was  passed under direct vision. Throughout the                            procedure, the patient's blood pressure, pulse, and                            oxygen saturations were monitored continuously. The                            Olympus Scope [id number] was introduced through the                            mouth, and advanced to the second part of duodenum.                             The upper GI endoscopy was accomplished without                            difficulty. The patient tolerated the procedure                            well. Scope In: Scope Out: Findings:                 The examined esophagus was normal.                           The Z-line was regular and was found 40 cm from the                            incisors.                           Localized moderate inflammation characterized by                            congestion (edema), erythema, and shallow,                            clean-based ulcers was found in the gastric antrum.                            Biopsies were taken with a cold forceps for                            histology (jar 1). Estimated blood loss was minimal.                           The gastric fundus and gastric body were normal.                            Additional mucosal biopsies were taken with a cold                            forceps for Helicobacter pylori testing (jar  2).                            Estimated blood loss was minimal.                           The examined duodenum was normal. Complications:            No immediate complications. Estimated Blood Loss:     Estimated blood loss was minimal. Impression:               - Normal esophagus.                           - Z-line regular, 40 cm from the incisors.                           - Gastritis. Biopsied.                           - Normal gastric fundus and gastric body. Biopsied.                           - Normal examined duodenum. Recommendation:           - Patient has a contact number available for                            emergencies. The signs and symptoms of potential                            delayed complications were discussed with the                            patient. Return to normal activities tomorrow.                            Written discharge instructions were provided to the                            patient.                            - Resume previous diet.                           - Continue present medications.                           - Await pathology results.                           - Use Protonix (pantoprazole) 40 mg PO BID for 8                            weeks, then reduce to 40 mg daily.                           -  Use sucralfate tablets 1 gram PO BID for 4 weeks.                           - Repeat upper endoscopy in 8 weeks to evaluate for                            appropriate ulcer healing.                           - Follow-up with Amy Esterwood in the GI clinic in                            3 months, or sooner as needed. Doristine Locks, MD 11/12/2022 10:27:56 AM

## 2022-11-12 NOTE — Progress Notes (Signed)
Called to room to assist during endoscopic procedure.  Patient ID and intended procedure confirmed with present staff. Received instructions for my participation in the procedure from the performing physician.  

## 2022-11-13 ENCOUNTER — Telehealth: Payer: Self-pay

## 2022-11-13 NOTE — Telephone Encounter (Signed)
  Follow up Call-     11/12/2022    9:32 AM  Call back number  Post procedure Call Back phone  # [phone]  Permission to leave phone message Yes     Patient questions:  Do you have a fever, pain , or abdominal swelling? No. Pain Score  0 *  Have you tolerated food without any problems? Yes.    Have you been able to return to your normal activities? Yes.    Do you have any questions about your discharge instructions: Diet   No. Medications  No. Follow up visit  No.  Do you have questions or concerns about your Care? No.  Actions: * If pain score is 4 or above: No action needed, pain <4.

## 2022-12-14 ENCOUNTER — Other Ambulatory Visit: Payer: Self-pay | Admitting: Gastroenterology

## 2022-12-21 ENCOUNTER — Ambulatory Visit: Payer: Medicare Other | Attending: Cardiology | Admitting: Cardiology

## 2022-12-21 ENCOUNTER — Encounter: Payer: Self-pay | Admitting: Cardiology

## 2022-12-24 ENCOUNTER — Encounter: Payer: Medicare Other | Admitting: Gastroenterology

## 2022-12-24 ENCOUNTER — Ambulatory Visit (AMBULATORY_SURGERY_CENTER): Payer: Medicare Other | Admitting: *Deleted

## 2022-12-24 VITALS — Ht 66.0 in | Wt 207.0 lb

## 2022-12-24 DIAGNOSIS — R1013 Epigastric pain: Secondary | ICD-10-CM

## 2022-12-24 DIAGNOSIS — R112 Nausea with vomiting, unspecified: Secondary | ICD-10-CM

## 2022-12-24 NOTE — Progress Notes (Signed)

## 2023-01-20 ENCOUNTER — Encounter: Payer: Self-pay | Admitting: Gastroenterology

## 2023-01-20 ENCOUNTER — Telehealth: Payer: Self-pay

## 2023-01-20 ENCOUNTER — Ambulatory Visit: Payer: Medicare Other | Admitting: Gastroenterology

## 2023-01-20 VITALS — BP 143/89 | HR 62 | Temp 97.8°F | Resp 13 | Ht 66.0 in | Wt 207.0 lb

## 2023-01-20 DIAGNOSIS — E119 Type 2 diabetes mellitus without complications: Secondary | ICD-10-CM | POA: Diagnosis not present

## 2023-01-20 DIAGNOSIS — J45909 Unspecified asthma, uncomplicated: Secondary | ICD-10-CM | POA: Diagnosis not present

## 2023-01-20 DIAGNOSIS — R1013 Epigastric pain: Secondary | ICD-10-CM

## 2023-01-20 DIAGNOSIS — G4733 Obstructive sleep apnea (adult) (pediatric): Secondary | ICD-10-CM | POA: Diagnosis not present

## 2023-01-20 DIAGNOSIS — R110 Nausea: Secondary | ICD-10-CM | POA: Diagnosis not present

## 2023-01-20 DIAGNOSIS — K2970 Gastritis, unspecified, without bleeding: Secondary | ICD-10-CM

## 2023-01-20 DIAGNOSIS — K319 Disease of stomach and duodenum, unspecified: Secondary | ICD-10-CM | POA: Diagnosis not present

## 2023-01-20 MED ORDER — SODIUM CHLORIDE 0.9 % IV SOLN
500.0000 mL | Freq: Once | INTRAVENOUS | Status: DC
Start: 2023-01-20 — End: 2023-01-20

## 2023-01-20 NOTE — Telephone Encounter (Signed)
-----   Message from Shellia Cleverly sent at 01/20/2023 10:57 AM EDT ----- Can you please order a HIDA for this patient. Thanks.

## 2023-01-20 NOTE — Progress Notes (Signed)
Called to room to assist during endoscopic procedure.  Patient ID and intended procedure confirmed with present staff. Received instructions for my participation in the procedure from the performing physician.  

## 2023-01-20 NOTE — Progress Notes (Signed)
Pt's states no medical or surgical changes since previsit or office visit. 

## 2023-01-20 NOTE — Patient Instructions (Signed)
Please read handouts provided. Continue present medications. Await pathology results. Return to GI clinic on 02/12/2023.   YOU HAD AN ENDOSCOPIC PROCEDURE TODAY AT THE [facility]:   Refer to the procedure report that was given to you for any specific questions about what was found during the examination.  If the procedure report does not answer your questions, please call your gastroenterologist to clarify.  If you requested that your care partner not be given the details of your procedure findings, then the procedure report has been included in a sealed envelope for you to review at your convenience later.  YOU SHOULD EXPECT: Some feelings of bloating in the abdomen. Passage of more gas than usual.  Walking can help get rid of the air that was put into your GI tract during the procedure and reduce the bloating. If you had a lower endoscopy (such as a colonoscopy or flexible sigmoidoscopy) you may notice spotting of blood in your stool or on the toilet paper. If you underwent a bowel prep for your procedure, you may not have a normal bowel movement for a few days.  Please Note:  You might notice some irritation and congestion in your nose or some drainage.  This is from the oxygen used during your procedure.  There is no need for concern and it should clear up in a day or so.  SYMPTOMS TO REPORT IMMEDIATELY:  Following upper endoscopy (EGD)  Vomiting of blood or coffee ground material  New chest pain or pain under the shoulder blades  Painful or persistently difficult swallowing  New shortness of breath  Fever of 100F or higher  Black, tarry-looking stools  For urgent or emergent issues, a gastroenterologist can be reached at any hour by calling [phone]. Do not use MyChart messaging for urgent concerns.    DIET:  We do recommend a small meal at first, but then you may proceed to your regular diet.  Drink plenty of fluids but you should avoid alcoholic beverages for 24  hours.  ACTIVITY:  You should plan to take it easy for the rest of today and you should NOT DRIVE or use heavy machinery until tomorrow (because of the sedation medicines used during the test).    FOLLOW UP: Our staff will call the number listed on your records the next business day following your procedure.  We will call around 7:15- 8:00 am to check on you and address any questions or concerns that you may have regarding the information given to you following your procedure. If we do not reach you, we will leave a message.     If any biopsies were taken you will be contacted by phone or by letter within the next 1-3 weeks.  Please call us at [phone] if you have not heard about the biopsies in 3 weeks.    SIGNATURES/CONFIDENTIALITY: You and/or your care partner have signed paperwork which will be entered into your electronic medical record.  These signatures attest to the fact that that the information above on your After Visit Summary has been reviewed and is understood.  Full responsibility of the confidentiality of this discharge information lies with you and/or your care-partner.

## 2023-01-20 NOTE — Telephone Encounter (Signed)
HIDA scan is ordered in EPIC. Sent message to the radiology schedulers to call the patient to schedule an appointment for the test.

## 2023-01-20 NOTE — Progress Notes (Signed)
Sedate, gd SR, tolerated procedure well, VSS, report to RN 

## 2023-01-20 NOTE — Progress Notes (Signed)
GASTROENTEROLOGY PROCEDURE H&P NOTE   Primary Care Physician: Shirlean Mylar, MD    Reason for Procedure:   Nausea, gastritis, gastric ulcers  Plan:    EGD  Patient is appropriate for endoscopic procedure(s) in the ambulatory ([facility]) setting.  The nature of the procedure, as well as the risks, benefits, and alternatives were carefully and thoroughly reviewed with the patient. Ample time for discussion and questions allowed. The patient understood, was satisfied, and agreed to proceed.     HPI: [name] is a 57 y.o. female who presents for EGD to evaluate for healing of erosive gastritis.   EGD on 11/12/2022 notable for moderate gastritis with clean based antral ulcers (path: non-H pylori gastritis). The remainder of the exam was normal. Was treated with Protonix 40 mg BID x8 weeks then 40 mg daily now, along with sucralfate x4 weeks, and presents today to evaluate for appropriate mucosal healing.   Currently taking Protonix 40 mg daily.  Since then MEG pain has resolved, but still with daily nausea.   Past Medical History:  Diagnosis Date   Anxiety    per notes from Dr Shon Baton (orthopedics)   Arthritis    left shoulder, bursitis- right shoulder   Asthma    Back pain 02/12/2021   L 4 to L 5 sees dr Ethelene Hal starting injections soon   Concussion    2005 and 2006 and oct 2021 due to [place]   COVID 02/2020   cough fatiguelow grade temp x 1 weeks took monoclonal antibodies   COVID 02/13/2021   ASYMPTOMATIC   Depression    Diabetes mellitus without complication (HCC)    Dilated aortic root (HCC)    38mm by CTA 07/2018   Fatty liver    H/O cluster headache    High cholesterol    Hypertension    Lipoma    Morbid obesity with BMI of 40.0-44.9, adult (HCC)    OSA (obstructive sleep apnea)    mild with AHI 11/hr now on CPAP at 18cm H2O   Rash    left thigh healing   Sleep apnea    Swelling    legs/feet/hands   Wears glasses 02/12/2021    Past Surgical  History:  Procedure Laterality Date   ABDOMINAL HYSTERECTOMY  2006   partial   BACK SURGERY  2006   x2 lower L 4 to L 5   CARPAL TUNNEL RELEASE     right   CESAREAN SECTION  1994 and 2005   COLONOSCOPY  06/21/2018   Dr. Kerin Salen, [facility]. One 5mm polyp in the transverse colon, removed with hot snare. Resected and retrived.One 5mm polyp in the recum colon, removed with hot snare. Resected and retrived. The examination is otherwise normal on direct and retroflexion views.   KNEE SURGERY Left 06/15/1985   arthroscopic   LIPOMA EXCISION N/A 03/05/2021   Procedure: EXCISION LIPOMA AND EPIDERMAL CYST BACK;  Surgeon: Fritzi Mandes, MD;  Location: [facility];  Service: General;  Laterality: N/A;   SHOULDER SURGERY Left    2020 and 2021 labrum and rotator cuff repair   SHOULDER SURGERY Right 2007   SPINAL CORD STIMULATOR IMPLANT  2012   battery replacement 2017 and 2021   UPPER GASTROINTESTINAL ENDOSCOPY      Prior to Admission medications   Medication Sig Start Date End Date Taking? Authorizing Provider  amLODipine (NORVASC) 10 MG tablet Take 10 mg by mouth every morning.  04/11/14  Yes  [provider]  glimepiride (AMARYL) 2 MG tablet Take 1 tablet (2 mg total) by mouth daily before breakfast. 03/28/20  Yes Shamleffer, Konrad Dolores, MD  glucose blood (ONETOUCH VERIO) test strip Use as instructed to test blood sugar 2 times daily E11.65 08/25/19  Yes Shamleffer, Konrad Dolores, MD  metoprolol (TOPROL-XL) 200 MG 24 hr tablet Take 200 mg by mouth every evening.    Yes [provider]  triamcinolone cream (KENALOG) 0.1 % as needed (flare ups). 10/19/19  Yes [provider]  Albuterol Sulfate (PROAIR RESPICLICK) 108 (90 Base) MCG/ACT AEPB INHALE 1 PUFF BY MOUTH EVERY 4 HOURS AS NEEDED 03/18/20   [provider]  aspirin EC 81 MG tablet Take 1 tablet (81 mg total) by mouth daily. 01/08/17   Robbie Lis M, PA-C   atorvastatin (LIPITOR) 10 MG tablet TAKE 1 TABLET(10 MG) BY MOUTH DAILY Patient not taking: Reported on 01/20/2023 03/28/21   Shamleffer, Konrad Dolores, MD  Calcium Carbonate-Vitamin D (CALCIUM 600 + D PO) Take 2 tablets by mouth every morning.     [provider]  carboxymethylcellulose (REFRESH PLUS) 0.5 % SOLN Place 1 drop into both eyes 3 (three) times daily as needed (dry eyes).    [provider]  famotidine (PEPCID) 20 MG tablet TAKE 1 TABLET(20 MG) BY MOUTH DAILY Patient not taking: Reported on 10/01/2022 10/20/21   Arnaldo Natal, NP  metFORMIN (GLUCOPHAGE-XR) 500 MG 24 hr tablet Take 4 tablets (2,000 mg total) by mouth daily with supper. Take 2,000 mg by mouth daily with supper. Patient not taking: Reported on 10/01/2022 07/28/21   Shamleffer, Konrad Dolores, MD  naproxen (NAPROSYN) 375 MG tablet Take 1 tablet (375 mg total) by mouth 2 (two) times daily. Patient not taking: Reported on 10/01/2022 10/17/21   Bing Neighbors, NP  pantoprazole (PROTONIX) 40 MG tablet Take 1 tablet (40 mg total) by mouth 2 (two) times daily before a meal. 11/12/22 01/07/23  Nolawi Kanady V, DO  pantoprazole (PROTONIX) 40 MG tablet Take 1 tablet (40 mg total) by mouth daily. 11/12/22   Rodricus Candelaria V, DO  sucralfate (CARAFATE) 1 g tablet Take 1 tablet (1 g total) by mouth 2 (two) times daily for 28 days. 11/12/22 12/10/22  Shayla Heming V, DO  tiZANidine (ZANAFLEX) 2 MG tablet Take 1-2 tablets (2-4 mg total) by mouth every 8 (eight) hours as needed for muscle spasms. Patient not taking: Reported on 10/01/2022 10/17/21   Bing Neighbors, NP  TRULICITY 1.5 MG/0.5ML SOPN ADMINISTER 1.5 MG UNDER THE SKIN 1 TIME A WEEK Patient taking differently: ADMINISTER 4.5 MG UNDER THE SKIN 1 TIME A WEEK 12/17/21   Shamleffer, Konrad Dolores, MD    Current Outpatient Medications  Medication Sig Dispense Refill   amLODipine (NORVASC) 10 MG tablet Take 10 mg by mouth every morning.       glimepiride (AMARYL) 2 MG tablet Take 1 tablet (2 mg total) by mouth daily before breakfast. 90 tablet 3   glucose blood (ONETOUCH VERIO) test strip Use as instructed to test blood sugar 2 times daily E11.65 100 each 12   metoprolol (TOPROL-XL) 200 MG 24 hr tablet Take 200 mg by mouth every evening.      triamcinolone cream (KENALOG) 0.1 % as needed (flare ups).     Albuterol Sulfate (PROAIR RESPICLICK) 108 (90 Base) MCG/ACT AEPB INHALE 1 PUFF BY MOUTH EVERY 4 HOURS AS NEEDED     aspirin EC 81 MG tablet Take 1 tablet (81 mg  total) by mouth daily.     atorvastatin (LIPITOR) 10 MG tablet TAKE 1 TABLET(10 MG) BY MOUTH DAILY (Patient not taking: Reported on 01/20/2023) 90 tablet 3   Calcium Carbonate-Vitamin D (CALCIUM 600 + D PO) Take 2 tablets by mouth every morning.      carboxymethylcellulose (REFRESH PLUS) 0.5 % SOLN Place 1 drop into both eyes 3 (three) times daily as needed (dry eyes).     famotidine (PEPCID) 20 MG tablet TAKE 1 TABLET(20 MG) BY MOUTH DAILY (Patient not taking: Reported on 10/01/2022) 90 tablet 0   metFORMIN (GLUCOPHAGE-XR) 500 MG 24 hr tablet Take 4 tablets (2,000 mg total) by mouth daily with supper. Take 2,000 mg by mouth daily with supper. (Patient not taking: Reported on 10/01/2022) 120 tablet 0   naproxen (NAPROSYN) 375 MG tablet Take 1 tablet (375 mg total) by mouth 2 (two) times daily. (Patient not taking: Reported on 10/01/2022) 20 tablet 0   pantoprazole (PROTONIX) 40 MG tablet Take 1 tablet (40 mg total) by mouth 2 (two) times daily before a meal. 112 tablet 0   pantoprazole (PROTONIX) 40 MG tablet Take 1 tablet (40 mg total) by mouth daily. 90 tablet 3   sucralfate (CARAFATE) 1 g tablet Take 1 tablet (1 g total) by mouth 2 (two) times daily for 28 days. 56 tablet 0   tiZANidine (ZANAFLEX) 2 MG tablet Take 1-2 tablets (2-4 mg total) by mouth every 8 (eight) hours as needed for muscle spasms. (Patient not taking: Reported on 10/01/2022) 20 tablet 0   TRULICITY 1.5 MG/0.5ML  SOPN ADMINISTER 1.5 MG UNDER THE SKIN 1 TIME A WEEK (Patient taking differently: ADMINISTER 4.5 MG UNDER THE SKIN 1 TIME A WEEK) 2 mL 1   Current Facility-Administered Medications  Medication Dose Route Frequency Provider Last Rate Last Admin   0.9 %  sodium chloride infusion  500 mL Intravenous Once Tonea Leiphart V, DO        Allergies as of 01/20/2023 - Review Complete 01/20/2023  Allergen Reaction Noted   Influenza virus vaccine  11/12/2022   Latex  02/12/2021   Tape Itching and Rash 11/12/2022    Family History  Problem Relation Age of Onset   Hypertension Mother    Diabetes Mother    Lymphoma Mother    Breast cancer Mother    Arthritis Mother    Colon polyps Mother    Hypertension Father    Diabetes Father    Heart disease Father    Prostate cancer Father    Colon polyps Father    Kidney disease Father    Hypertension Sister    Diabetes Sister    Hypertension Brother    Diabetes Brother    Hypertension Maternal Aunt    Hypertension Maternal Uncle    Hypertension Paternal Aunt    Hypertension Paternal Uncle    Hypertension Maternal Grandmother    Ovarian cancer Maternal Grandmother    Hypertension Maternal Grandfather    Hypertension Paternal Grandmother    Breast cancer Paternal Grandmother    Stomach cancer Paternal Grandmother    Hypertension Paternal Grandfather    Heart disease Paternal Grandfather    Esophageal cancer Neg Hx    Colon cancer Neg Hx    Rectal cancer Neg Hx     Social History   Socioeconomic History   Marital status: Married    Spouse name: Not on file   Number of children: 2   Years of education: Not on file   Highest education  level: Some college, no degree  Occupational History   Occupation: disability  Tobacco Use   Smoking status: Some Days    Current packs/day: 0.00    Average packs/day: 1 pack/day for 10.0 years (10.0 ttl pk-yrs)    Types: Cigars, Cigarettes    Start date: 73    Last attempt to quit: 2004    Years  since quitting: 20.6   Smokeless tobacco: Never   Tobacco comments:    Currently smokes 4 cigars/week X 2 years  Vaping Use   Vaping status: Never Used  Substance and Sexual Activity   Alcohol use: Yes    Comment: occ   Drug use: Not Currently   Sexual activity: Not on file  Other Topics Concern   Not on file  Social History Narrative   Caffeine: occasional    Right handed   Lives at home with husband, father, and two sons   Social Determinants of Health   Financial Resource Strain: Not on file  Food Insecurity: Not on file  Transportation Needs: Not on file  Physical Activity: Not on file  Stress: Not on file (07/28/2019)  Social Connections: Not on file  Intimate Partner Violence: Low Risk  (09/26/2019)   [facility]   Intimate Partner Violence    Insults You: Not on file    Threatens You: Not on file    Screams at [place]: Not on file    Physically Hurt: Not on file    Intimate Partner Violence Score: Not on file    Physical Exam: Vital signs in last 24 hours: @BP  (!) 153/86   Pulse (!) 59   Temp 97.8 F (36.6 C)   Ht 5\' 6"  (1.676 m)   Wt 207 lb (93.9 kg)   SpO2 98%   BMI 33.41 kg/m  GEN: NAD EYE: Sclerae anicteric ENT: MMM CV: Non-tachycardic Pulm: CTA b/l GI: Soft, NT/ND NEURO:  Alert & Oriented x 3   Doristine Locks, DO [facility]   01/20/2023 10:23 AM

## 2023-01-20 NOTE — Op Note (Signed)
[facility] Patient Name: [name] Procedure Date: 01/20/2023 10:28 AM MRN: [id number] Endoscopist: Doristine Locks , MD, [id number] Age: 57 Referring MD:  Date of Birth: 03/06/1966 Gender: Female Account #: [id number] Procedure:                Upper GI endoscopy Indications:              Follow-up of gastric ulcer, Gastritis, Nausea                           EGD on 11/12/2022 notable for moderate gastritis                            with clean based antral ulcers (path: non-H pylori                            gastritis). The remainder of the exam was normal.                            Was treated with Protonix 40 mg BID x8 weeks then                            40 mg daily now, along with sucralfate x4 weeks,                            and presents today to evaluate for appropriate                            mucosal healing.                           Currently taking Protonix 40 mg daily.                           Since then MEG pain has resolved, but still with                            daily nausea. Medicines:                Monitored Anesthesia Care Procedure:                Pre-Anesthesia Assessment:                           - Prior to the procedure, a History and Physical                            was performed, and patient medications and                            allergies were reviewed. The patient's tolerance of                            previous anesthesia was also reviewed. The risks  and benefits of the procedure and the sedation                            options and risks were discussed with the patient.                            All questions were answered, and informed consent                            was obtained. Prior Anticoagulants: The patient has                            taken no anticoagulant or antiplatelet agents. ASA                            Grade Assessment: II - A patient with mild systemic                             disease. After reviewing the risks and benefits,                            the patient was deemed in satisfactory condition to                            undergo the procedure.                           After obtaining informed consent, the endoscope was                            passed under direct vision. Throughout the                            procedure, the patient's blood pressure, pulse, and                            oxygen saturations were monitored continuously. The                            Olympus Scope [id number] was introduced through the                            mouth, and advanced to the second part of duodenum.                            The upper GI endoscopy was accomplished without                            difficulty. The patient tolerated the procedure                            well. Scope In: Scope Out: Findings:  The examined esophagus was normal.                           Localized mild inflammation characterized by                            erythema was found in the gastric antrum. The                            previously noted ulcers have since healed. This                            overall was much improved compared with the                            previous endoscopy. Biopsies were taken with a cold                            forceps for histology. Estimated blood loss was                            minimal.                           The gastric fundus, gastric body and incisura were                            normal.                           The examined duodenum was normal. Complications:            No immediate complications. Estimated Blood Loss:     Estimated blood loss was minimal. Impression:               - Normal esophagus.                           - Mild, non-ulcer antral gastritis. Biopsied.                           - Normal gastric fundus, gastric body and incisura.                           -  Normal examined duodenum. Recommendation:           - Patient has a contact number available for                            emergencies. The signs and symptoms of potential                            delayed complications were discussed with the                            patient. Return to normal activities tomorrow.  Written discharge instructions were provided to the                            patient.                           - Resume previous diet.                           - Continue present medications.                           - Await pathology results.                           - Return to GI clinic as scheduled on 02/12/2023.                           - If pathology unrevealing and ongoing symptoms,                            can consider additional diagnostic studies to                            include HIDA scan, cross sectional imaging. Doristine Locks, MD 01/20/2023 10:47:31 AM

## 2023-01-21 ENCOUNTER — Telehealth: Payer: Self-pay

## 2023-01-21 NOTE — Telephone Encounter (Signed)
Attempted to reach patient for post-procedure f/u call. No answer. Left message for her to please not hesitate to call if she has any questions/concerns regarding her care. 

## 2023-02-12 ENCOUNTER — Ambulatory Visit: Payer: Medicare Other | Admitting: Physician Assistant

## 2023-02-25 ENCOUNTER — Ambulatory Visit (HOSPITAL_COMMUNITY): Payer: 59

## 2023-03-04 DIAGNOSIS — M792 Neuralgia and neuritis, unspecified: Secondary | ICD-10-CM | POA: Diagnosis not present

## 2023-03-04 DIAGNOSIS — L03031 Cellulitis of right toe: Secondary | ICD-10-CM | POA: Diagnosis not present

## 2023-03-04 DIAGNOSIS — L600 Ingrowing nail: Secondary | ICD-10-CM | POA: Diagnosis not present

## 2023-03-04 DIAGNOSIS — E139 Other specified diabetes mellitus without complications: Secondary | ICD-10-CM | POA: Diagnosis not present

## 2023-03-04 DIAGNOSIS — B353 Tinea pedis: Secondary | ICD-10-CM | POA: Diagnosis not present

## 2023-03-04 DIAGNOSIS — B351 Tinea unguium: Secondary | ICD-10-CM | POA: Diagnosis not present

## 2023-03-17 ENCOUNTER — Other Ambulatory Visit: Payer: Self-pay | Admitting: Obstetrics and Gynecology

## 2023-03-17 DIAGNOSIS — Z1231 Encounter for screening mammogram for malignant neoplasm of breast: Secondary | ICD-10-CM

## 2023-03-23 DIAGNOSIS — L600 Ingrowing nail: Secondary | ICD-10-CM | POA: Diagnosis not present

## 2023-03-23 DIAGNOSIS — L03031 Cellulitis of right toe: Secondary | ICD-10-CM | POA: Diagnosis not present

## 2023-04-06 DIAGNOSIS — B351 Tinea unguium: Secondary | ICD-10-CM | POA: Diagnosis not present

## 2023-04-06 DIAGNOSIS — L03031 Cellulitis of right toe: Secondary | ICD-10-CM | POA: Diagnosis not present

## 2023-04-06 DIAGNOSIS — M792 Neuralgia and neuritis, unspecified: Secondary | ICD-10-CM | POA: Diagnosis not present

## 2023-04-06 DIAGNOSIS — L600 Ingrowing nail: Secondary | ICD-10-CM | POA: Diagnosis not present

## 2023-04-08 ENCOUNTER — Ambulatory Visit
Admission: RE | Admit: 2023-04-08 | Discharge: 2023-04-08 | Disposition: A | Discharge status: Home / Self Care | Payer: Medicare Other | Source: Ambulatory Visit

## 2023-04-08 DIAGNOSIS — Z1231 Encounter for screening mammogram for malignant neoplasm of breast: Secondary | ICD-10-CM

## 2023-04-23 ENCOUNTER — Ambulatory Visit: Payer: Medicare Other | Admitting: Gastroenterology

## 2023-05-05 ENCOUNTER — Other Ambulatory Visit: Payer: Self-pay

## 2023-05-05 DIAGNOSIS — K824 Cholesterolosis of gallbladder: Secondary | ICD-10-CM

## 2023-05-05 DIAGNOSIS — K760 Fatty (change of) liver, not elsewhere classified: Secondary | ICD-10-CM

## 2023-05-17 ENCOUNTER — Ambulatory Visit (HOSPITAL_COMMUNITY)
Admission: RE | Admit: 2023-05-17 | Discharge: 2023-05-17 | Disposition: A | Discharge status: Home / Self Care | Payer: 59 | Source: Ambulatory Visit | Attending: Physician Assistant | Admitting: Physician Assistant

## 2023-05-17 DIAGNOSIS — K760 Fatty (change of) liver, not elsewhere classified: Secondary | ICD-10-CM | POA: Diagnosis not present

## 2023-05-17 DIAGNOSIS — K824 Cholesterolosis of gallbladder: Secondary | ICD-10-CM | POA: Insufficient documentation

## 2024-04-07 ENCOUNTER — Other Ambulatory Visit: Payer: Self-pay | Admitting: Obstetrics and Gynecology

## 2024-04-07 DIAGNOSIS — Z1231 Encounter for screening mammogram for malignant neoplasm of breast: Secondary | ICD-10-CM

## 2024-04-21 ENCOUNTER — Ambulatory Visit
Admission: RE | Admit: 2024-04-21 | Discharge: 2024-04-21 | Disposition: A | Discharge status: Home / Self Care | Source: Ambulatory Visit | Attending: Obstetrics and Gynecology | Admitting: Obstetrics and Gynecology

## 2024-04-21 DIAGNOSIS — Z1231 Encounter for screening mammogram for malignant neoplasm of breast: Secondary | ICD-10-CM

## 2024-09-05 ENCOUNTER — Ambulatory Visit: Admitting: Cardiology
